# Patient Record
Sex: Male | Born: 1945 | Race: White | Hispanic: No | Marital: Married | Smoking: Never smoker
Health system: Southern US, Community
[De-identification: ages and names within clinical notes are randomized; demographics above are authoritative.]

## PROBLEM LIST (undated history)

## (undated) DIAGNOSIS — Z952 Presence of prosthetic heart valve: Secondary | ICD-10-CM

## (undated) DIAGNOSIS — I10 Essential (primary) hypertension: Secondary | ICD-10-CM

## (undated) DIAGNOSIS — T8209XA Other mechanical complication of heart valve prosthesis, initial encounter: Secondary | ICD-10-CM

## (undated) DIAGNOSIS — Q2381 Bicuspid aortic valve: Secondary | ICD-10-CM

## (undated) DIAGNOSIS — M791 Myalgia, unspecified site: Secondary | ICD-10-CM

## (undated) DIAGNOSIS — T82897A Other specified complication of cardiac prosthetic devices, implants and grafts, initial encounter: Secondary | ICD-10-CM

## (undated) DIAGNOSIS — G4730 Sleep apnea, unspecified: Secondary | ICD-10-CM

## (undated) DIAGNOSIS — M25569 Pain in unspecified knee: Secondary | ICD-10-CM

## (undated) DIAGNOSIS — J45909 Unspecified asthma, uncomplicated: Secondary | ICD-10-CM

## (undated) DIAGNOSIS — R5383 Other fatigue: Secondary | ICD-10-CM

## (undated) DIAGNOSIS — I5032 Chronic diastolic (congestive) heart failure: Secondary | ICD-10-CM

## (undated) DIAGNOSIS — I5031 Acute diastolic (congestive) heart failure: Secondary | ICD-10-CM

## (undated) DIAGNOSIS — H9190 Unspecified hearing loss, unspecified ear: Secondary | ICD-10-CM

## (undated) DIAGNOSIS — Q231 Congenital insufficiency of aortic valve: Secondary | ICD-10-CM

## (undated) HISTORY — DX: Unspecified hearing loss, unspecified ear: H91.90

## (undated) HISTORY — PX: CARDIAC SURGERY: SHX584

## (undated) HISTORY — DX: Acute diastolic (congestive) heart failure: I50.31

## (undated) HISTORY — DX: Unspecified asthma, uncomplicated: J45.909

## (undated) HISTORY — DX: Pain in unspecified knee: M25.569

## (undated) HISTORY — DX: Myalgia, unspecified site: M79.10

## (undated) HISTORY — DX: Other specified complication of cardiac prosthetic devices, implants and grafts, initial encounter: T82.897A

## (undated) HISTORY — DX: Other fatigue: R53.83

## (undated) HISTORY — DX: Essential (primary) hypertension: I10

---

## 2006-10-12 ENCOUNTER — Ambulatory Visit: Payer: Self-pay | Admitting: Infectious Diseases

## 2006-10-12 ENCOUNTER — Inpatient Hospital Stay (HOSPITAL_COMMUNITY): Admission: EM | Admit: 2006-10-12 | Discharge: 2006-10-24 | Payer: Self-pay | Admitting: Emergency Medicine

## 2006-10-13 ENCOUNTER — Ambulatory Visit: Payer: Self-pay | Admitting: Cardiothoracic Surgery

## 2006-11-09 ENCOUNTER — Ambulatory Visit: Payer: Self-pay | Admitting: Cardiothoracic Surgery

## 2011-09-16 DIAGNOSIS — Z0000 Encounter for general adult medical examination without abnormal findings: Secondary | ICD-10-CM | POA: Diagnosis not present

## 2012-02-22 DIAGNOSIS — I10 Essential (primary) hypertension: Secondary | ICD-10-CM | POA: Diagnosis not present

## 2012-02-22 DIAGNOSIS — Z9119 Patient's noncompliance with other medical treatment and regimen: Secondary | ICD-10-CM | POA: Diagnosis not present

## 2012-02-22 DIAGNOSIS — Z954 Presence of other heart-valve replacement: Secondary | ICD-10-CM | POA: Diagnosis not present

## 2012-02-22 DIAGNOSIS — I4892 Unspecified atrial flutter: Secondary | ICD-10-CM | POA: Diagnosis not present

## 2012-07-24 DIAGNOSIS — L255 Unspecified contact dermatitis due to plants, except food: Secondary | ICD-10-CM | POA: Diagnosis not present

## 2012-08-17 DIAGNOSIS — L259 Unspecified contact dermatitis, unspecified cause: Secondary | ICD-10-CM | POA: Diagnosis not present

## 2013-04-30 DIAGNOSIS — I10 Essential (primary) hypertension: Secondary | ICD-10-CM | POA: Diagnosis not present

## 2013-04-30 DIAGNOSIS — I4892 Unspecified atrial flutter: Secondary | ICD-10-CM | POA: Diagnosis not present

## 2013-04-30 DIAGNOSIS — E7800 Pure hypercholesterolemia, unspecified: Secondary | ICD-10-CM | POA: Diagnosis not present

## 2013-04-30 DIAGNOSIS — Z954 Presence of other heart-valve replacement: Secondary | ICD-10-CM | POA: Diagnosis not present

## 2013-07-02 ENCOUNTER — Other Ambulatory Visit: Payer: Self-pay

## 2013-07-02 DIAGNOSIS — I10 Essential (primary) hypertension: Secondary | ICD-10-CM

## 2013-07-02 MED ORDER — POTASSIUM CHLORIDE CRYS ER 20 MEQ PO TBCR: 20.0000 meq | EXTENDED_RELEASE_TABLET | Freq: Every day | ORAL | Status: DC

## 2013-10-03 ENCOUNTER — Telehealth: Payer: Self-pay

## 2013-10-03 NOTE — Telephone Encounter (Signed)
TO Douglas Hart. I tried to get in touch with this pt because we do not have any record of having this pt on plavix. I did not send in any refills because I needed to talk to him first. I lvm but he has not returned my call. This is yalls pts.

## 2013-10-03 NOTE — Telephone Encounter (Signed)
LVM for pt to return call. We do not have on file that pt was ever on Plavix.

## 2013-10-04 ENCOUNTER — Telehealth: Payer: Self-pay | Admitting: *Deleted

## 2013-10-04 MED ORDER — METOPROLOL SUCCINATE ER 50 MG PO TB24: 50.0000 mg | ORAL_TABLET | Freq: Every day | ORAL | Status: DC

## 2013-10-04 MED ORDER — FUROSEMIDE 40 MG PO TABS: 40.0000 mg | ORAL_TABLET | Freq: Every day | ORAL | Status: DC

## 2013-10-04 NOTE — Telephone Encounter (Signed)
called pt pt was never on plavix. pt lasix and metoprolo refiilled.pt aware.

## 2013-10-04 NOTE — Telephone Encounter (Signed)
Patient requests refill on furosemide and metoprolol to be sent to [facility] in [place]. Thanks, MI

## 2013-10-11 ENCOUNTER — Other Ambulatory Visit: Payer: Self-pay

## 2013-11-26 DIAGNOSIS — L2089 Other atopic dermatitis: Secondary | ICD-10-CM | POA: Diagnosis not present

## 2013-11-26 DIAGNOSIS — I10 Essential (primary) hypertension: Secondary | ICD-10-CM | POA: Diagnosis not present

## 2013-12-04 ENCOUNTER — Ambulatory Visit (INDEPENDENT_AMBULATORY_CARE_PROVIDER_SITE_OTHER): Payer: BC Managed Care – PPO

## 2013-12-04 VITALS — BP 138/78 | HR 69 | Resp 18

## 2013-12-04 DIAGNOSIS — M779 Enthesopathy, unspecified: Secondary | ICD-10-CM | POA: Diagnosis not present

## 2013-12-04 DIAGNOSIS — M722 Plantar fascial fibromatosis: Secondary | ICD-10-CM

## 2013-12-04 NOTE — Patient Instructions (Signed)
WEARING INSTRUCTIONS FOR ORTHOTICS  Don't expect to be comfortable wearing your orthotic devices for the first time.  Like eyeglasses, you may be aware of them as time passes, they will not be uncomfortable and you will enjoy wearing them.  FOLLOW THESE INSTRUCTIONS EXACTLY!  1. Wear your orthotic devices for:       Not more than 1 hour the first day.       Not more than 2 hours the second day.       Not more than 3 hours the third day and so on.        Or wear them for as long as they feel comfortable.       If you experience discomfort in your feet or legs take them out.  When feet & legs feel       better, put them back in.  You do need to be consistent and wear them a little        everyday. 2.   If at any time the orthotic devices become acutely uncomfortable before the       time for that particular day, STOP WEARING THEM. 3.   On the next day, do not increase the wearing time. 4.   Subsequently, increase the wearing time by 15-30 minutes only if comfortable to do       so. 5.   You will be seen by your doctor about 2-4 weeks after you receive your orthotic       devices, at which time you will probably be wearing your devices comfortably        for about 8 hours or more a day. 6.   Some patients occasionally report mild aches or discomfort in other parts of the of       body such as the knees, hips or back after 3 or 4 consecutive hours of wear.  If this       is the case with you, do not extend your wearing time.  Instead, cut it back an hour or       two.  In all likelihood, these symptoms will disappear in a short period of time as your       body posture realigns itself and functions more efficiently. 7.   It is possible that your orthotic device may require some small changes or adjustment       to improve their function or make them more comfortable.   This is usually not done       before one to three months have elapsed.  These adjustments are made in        accordance  with the changed position your feet are assuming as a result of       improved biomechanical function. 8.   In women's shoes, it's not unusual for your heel to slip out of the shoe, particularly if       they are step-in-shoes.  If this is the case, try other shoes or other styles.  Try to       purchase shoes which have deeper heal seats or higher heel counters. 9.   Squeaking of orthotics devices in the shoes is due to the movement of the devices       when they are functioning normally.  To eliminate squeaking, simply dust some       baby powder into your shoes before inserting the devices.  If this does not work,          apply soap or wax to the edges of the orthotic devices or put a tissue into the shoes. 10. It is important that you follow these directions explicitly.  Failure to do so will simply       prolong the adjustment period or create problems which are easily avoided.  It makes       no difference if you are wearing your orthotic devices for only a few hours after        several months, so long as you are wearing them comfortably for those hours. 11. If you have any questions or complaints, contact our office.  We have no way of       knowing about your problems unless you tell us.  If we do not hear from you, we will       assume that you are proceeding well.     ICE INSTRUCTIONS  Apply ice or cold pack to the affected area at least 3 times a day for 10-15 minutes each time.  You should also use ice after prolonged activity or vigorous exercise.  Do not apply ice longer than 20 minutes at one time.  Always keep a cloth between your skin and the ice pack to prevent burns.  Being consistent and following these instructions will help control your symptoms.  We suggest you purchase a gel ice pack because they are reusable and do bit leak.  Some of them are designed to wrap around the area.  Use the method that works best for you.  Here are some other suggestions for icing.   Use a  frozen bag of peas or corn-inexpensive and molds well to your body, usually stays frozen for 10 to 20 minutes.  Wet a towel with cold water and squeeze out the excess until it's damp.  Place in a bag in the freezer for 20 minutes. Then remove and use. 

## 2013-12-04 NOTE — Progress Notes (Signed)
   Subjective:    Patient ID: Douglas Hart, male    DOB: 04-19-1946, 68 y.o.   MRN: [id number]  HPI I need some new inserts and I have the super feet in my shoes and I also have the old inserts that I got from here and my left hurts some    Review of Systems  Constitutional: Positive for fatigue.  HENT: Positive for hearing loss.        Ringing in ears   Musculoskeletal:       Joint pain  All other systems reviewed and are negative.       Objective:   Physical Exam Neurovascular status is intact pedal pulses palpable bilateral epicritic and proprioceptive sensations intact patient convenience and he'll arch pain left more so than right secondary promontory changes his current orthotics are worn but still fitting knee replacement top cover is broken down to remove them several times are ready. At this time schedule functional orthotic casting his convenience should note orthopedic biomechanical exam unremarkable noncontributory mild digital contractures noted pain tenderness on plantar fascial area sometimes the sinus tarsi appropriate changes also some tenderness along the posterior tibial tendon distribution left more so than right slight prominence of the talonavicular noted left more so than right. Orthotics with beneficial however there worn need replacing at this time.       Assessment & Plan:  Assessment plantar fasciitis and capsulitis and mid tarsus the foot right at correction left foot more so than right this time we'll schedule orthotic casting his continues with 5 will do a cementless orthotic with Spenco top cover and fifth and cushion as well as stability and support will be contacted with the next 3 or 4 weeks when orthotics ready for fitting and dispensing. In the interim continue with current orthotics also using the super for orthotic which is beneficial patient did try terminate putting to orthotics or athletic and other insulin shoes and time she cross your patient and  his knees advised additionally were 1 orthoses in shoe and time repeat in 3 or 4 weeks for orthotic pickup orthotic casting with the next week.  Harriet Masson DPM

## 2013-12-10 DIAGNOSIS — R5383 Other fatigue: Secondary | ICD-10-CM | POA: Diagnosis not present

## 2013-12-10 DIAGNOSIS — R5381 Other malaise: Secondary | ICD-10-CM | POA: Diagnosis not present

## 2013-12-10 DIAGNOSIS — R0789 Other chest pain: Secondary | ICD-10-CM | POA: Diagnosis not present

## 2013-12-10 DIAGNOSIS — R0602 Shortness of breath: Secondary | ICD-10-CM | POA: Diagnosis not present

## 2013-12-11 ENCOUNTER — Other Ambulatory Visit: Payer: BC Managed Care – PPO

## 2013-12-18 ENCOUNTER — Encounter: Payer: Self-pay | Admitting: Interventional Cardiology

## 2014-01-02 ENCOUNTER — Encounter: Payer: Self-pay | Admitting: Interventional Cardiology

## 2014-01-13 DIAGNOSIS — G4730 Sleep apnea, unspecified: Secondary | ICD-10-CM | POA: Diagnosis not present

## 2014-01-13 DIAGNOSIS — R5381 Other malaise: Secondary | ICD-10-CM | POA: Diagnosis not present

## 2014-01-13 DIAGNOSIS — G4710 Hypersomnia, unspecified: Secondary | ICD-10-CM | POA: Diagnosis not present

## 2014-01-15 ENCOUNTER — Ambulatory Visit (INDEPENDENT_AMBULATORY_CARE_PROVIDER_SITE_OTHER): Payer: BC Managed Care – PPO

## 2014-01-15 VITALS — BP 133/81 | HR 67 | Resp 18

## 2014-01-15 DIAGNOSIS — R0989 Other specified symptoms and signs involving the circulatory and respiratory systems: Secondary | ICD-10-CM | POA: Diagnosis not present

## 2014-01-15 DIAGNOSIS — M722 Plantar fascial fibromatosis: Secondary | ICD-10-CM | POA: Diagnosis not present

## 2014-01-15 DIAGNOSIS — M779 Enthesopathy, unspecified: Secondary | ICD-10-CM | POA: Diagnosis not present

## 2014-01-15 DIAGNOSIS — G4730 Sleep apnea, unspecified: Secondary | ICD-10-CM | POA: Diagnosis not present

## 2014-01-15 DIAGNOSIS — R0609 Other forms of dyspnea: Secondary | ICD-10-CM | POA: Diagnosis not present

## 2014-01-15 DIAGNOSIS — I10 Essential (primary) hypertension: Secondary | ICD-10-CM | POA: Diagnosis not present

## 2014-01-15 NOTE — Progress Notes (Signed)
   Subjective:    Patient ID: LOGIN MUCKLEROY, male    DOB: [birth year], 68 y.o.   MRN: [id number]  HPI I am here to get my inserts for my shoes    Review of Systems no new systemic changes or findings noted     Objective:   Physical Exam Neurovascular status intact pedal pulses palpable at this time new orthotics are dispensed with break in wearing instructions orthotics fit and contour well to the foot and the Spenco type top cover and 4 bruisibility and cushioning the orthotics fit and contour well and patient really notice some difference we put them into shoes suggest regimen over the next week or 2 and follow instructions are dispensed with instructions for orthotic use in break in are given. Again neurovascular status intact no irritation from the orthotic at this time       Assessment & Plan:  Assessment plantar fasciitis/heel spur syndrome capsulitis respond to the orthoses the orthotics were placed in the old orthotics are given at this time with new Spenco top cover and reappointed in the future and as-needed basis for followup adjustments as needed next  [employer] DP

## 2014-01-15 NOTE — Patient Instructions (Signed)
WEARING INSTRUCTIONS FOR ORTHOTICS  Don't expect to be comfortable wearing your orthotic devices for the first time.  Like eyeglasses, you may be aware of them as time passes, they will not be uncomfortable and you will enjoy wearing them.  FOLLOW THESE INSTRUCTIONS EXACTLY!  1. Wear your orthotic devices for:       Not more than 1 hour the first day.       Not more than 2 hours the second day.       Not more than 3 hours the third day and so on.        Or wear them for as long as they feel comfortable.       If you experience discomfort in your feet or legs take them out.  When feet & legs feel       better, put them back in.  You do need to be consistent and wear them a little        everyday. 2.   If at any time the orthotic devices become acutely uncomfortable before the       time for that particular day, STOP WEARING THEM. 3.   On the next day, do not increase the wearing time. 4.   Subsequently, increase the wearing time by 15-30 minutes only if comfortable to do       so. 5.   You will be seen by your doctor about 2-4 weeks after you receive your orthotic       devices, at which time you will probably be wearing your devices comfortably        for about 8 hours or more a day. 6.   Some patients occasionally report mild aches or discomfort in other parts of the of       body such as the knees, hips or back after 3 or 4 consecutive hours of wear.  If this       is the case with you, do not extend your wearing time.  Instead, cut it back an hour or       two.  In all likelihood, these symptoms will disappear in a short period of time as your       body posture realigns itself and functions more efficiently. 7.   It is possible that your orthotic device may require some small changes or adjustment       to improve their function or make them more comfortable.   This is usually not done       before one to three months have elapsed.  These adjustments are made in        accordance  with the changed position your feet are assuming as a result of       improved biomechanical function. 8.   In women's shoes, it's not unusual for your heel to slip out of the shoe, particularly if       they are step-in-shoes.  If this is the case, try other shoes or other styles.  Try to       purchase shoes which have deeper heal seats or higher heel counters. 9.   Squeaking of orthotics devices in the shoes is due to the movement of the devices       when they are functioning normally.  To eliminate squeaking, simply dust some       baby powder into your shoes before inserting the devices.  If this does not work,          apply soap or wax to the edges of the orthotic devices or put a tissue into the shoes. 10. It is important that you follow these directions explicitly.  Failure to do so will simply       prolong the adjustment period or create problems which are easily avoided.  It makes       no difference if you are wearing your orthotic devices for only a few hours after        several months, so long as you are wearing them comfortably for those hours. 11. If you have any questions or complaints, contact our office.  We have no way of       knowing about your problems unless you tell us.  If we do not hear from you, we will       assume that you are proceeding well.     ICE INSTRUCTIONS  Apply ice or cold pack to the affected area at least 3 times a day for 10-15 minutes each time.  You should also use ice after prolonged activity or vigorous exercise.  Do not apply ice longer than 20 minutes at one time.  Always keep a cloth between your skin and the ice pack to prevent burns.  Being consistent and following these instructions will help control your symptoms.  We suggest you purchase a gel ice pack because they are reusable and do bit leak.  Some of them are designed to wrap around the area.  Use the method that works best for you.  Here are some other suggestions for icing.   Use a  frozen bag of peas or corn-inexpensive and molds well to your body, usually stays frozen for 10 to 20 minutes.  Wet a towel with cold water and squeeze out the excess until it's damp.  Place in a bag in the freezer for 20 minutes. Then remove and use. 

## 2014-01-16 DIAGNOSIS — Z954 Presence of other heart-valve replacement: Secondary | ICD-10-CM | POA: Diagnosis not present

## 2014-01-16 DIAGNOSIS — R195 Other fecal abnormalities: Secondary | ICD-10-CM | POA: Diagnosis not present

## 2014-01-16 DIAGNOSIS — D649 Anemia, unspecified: Secondary | ICD-10-CM | POA: Diagnosis not present

## 2014-02-05 DIAGNOSIS — G4710 Hypersomnia, unspecified: Secondary | ICD-10-CM | POA: Diagnosis not present

## 2014-02-05 DIAGNOSIS — R0609 Other forms of dyspnea: Secondary | ICD-10-CM | POA: Diagnosis not present

## 2014-02-05 DIAGNOSIS — R5383 Other fatigue: Secondary | ICD-10-CM | POA: Diagnosis not present

## 2014-02-05 DIAGNOSIS — J310 Chronic rhinitis: Secondary | ICD-10-CM | POA: Diagnosis not present

## 2014-02-05 DIAGNOSIS — R5381 Other malaise: Secondary | ICD-10-CM | POA: Diagnosis not present

## 2014-03-03 DIAGNOSIS — D126 Benign neoplasm of colon, unspecified: Secondary | ICD-10-CM | POA: Diagnosis not present

## 2014-03-03 DIAGNOSIS — R195 Other fecal abnormalities: Secondary | ICD-10-CM | POA: Diagnosis not present

## 2014-03-03 DIAGNOSIS — D649 Anemia, unspecified: Secondary | ICD-10-CM | POA: Diagnosis not present

## 2014-03-03 DIAGNOSIS — K250 Acute gastric ulcer with hemorrhage: Secondary | ICD-10-CM | POA: Diagnosis not present

## 2014-05-01 DIAGNOSIS — R195 Other fecal abnormalities: Secondary | ICD-10-CM | POA: Diagnosis not present

## 2014-06-03 DIAGNOSIS — K253 Acute gastric ulcer without hemorrhage or perforation: Secondary | ICD-10-CM | POA: Diagnosis not present

## 2014-06-03 DIAGNOSIS — Z954 Presence of other heart-valve replacement: Secondary | ICD-10-CM | POA: Diagnosis not present

## 2014-06-03 DIAGNOSIS — R195 Other fecal abnormalities: Secondary | ICD-10-CM | POA: Diagnosis not present

## 2014-06-25 DIAGNOSIS — K279 Peptic ulcer, site unspecified, unspecified as acute or chronic, without hemorrhage or perforation: Secondary | ICD-10-CM | POA: Diagnosis not present

## 2014-06-25 DIAGNOSIS — Z8719 Personal history of other diseases of the digestive system: Secondary | ICD-10-CM | POA: Diagnosis not present

## 2014-06-25 DIAGNOSIS — K253 Acute gastric ulcer without hemorrhage or perforation: Secondary | ICD-10-CM | POA: Diagnosis not present

## 2014-06-25 DIAGNOSIS — K219 Gastro-esophageal reflux disease without esophagitis: Secondary | ICD-10-CM | POA: Diagnosis not present

## 2014-06-30 ENCOUNTER — Other Ambulatory Visit: Payer: Self-pay

## 2014-06-30 ENCOUNTER — Other Ambulatory Visit: Payer: Self-pay | Admitting: Interventional Cardiology

## 2014-07-06 ENCOUNTER — Encounter: Payer: Self-pay | Admitting: *Deleted

## 2014-09-18 DIAGNOSIS — Z0000 Encounter for general adult medical examination without abnormal findings: Secondary | ICD-10-CM | POA: Diagnosis not present

## 2014-09-18 DIAGNOSIS — J209 Acute bronchitis, unspecified: Secondary | ICD-10-CM | POA: Diagnosis not present

## 2014-10-07 DIAGNOSIS — B9689 Other specified bacterial agents as the cause of diseases classified elsewhere: Secondary | ICD-10-CM | POA: Diagnosis not present

## 2014-10-07 DIAGNOSIS — J208 Acute bronchitis due to other specified organisms: Secondary | ICD-10-CM | POA: Diagnosis not present

## 2014-10-21 DIAGNOSIS — R062 Wheezing: Secondary | ICD-10-CM | POA: Diagnosis not present

## 2014-10-21 DIAGNOSIS — J42 Unspecified chronic bronchitis: Secondary | ICD-10-CM | POA: Diagnosis not present

## 2014-11-06 DIAGNOSIS — R195 Other fecal abnormalities: Secondary | ICD-10-CM | POA: Diagnosis not present

## 2015-03-12 ENCOUNTER — Other Ambulatory Visit: Payer: Self-pay | Admitting: Interventional Cardiology

## 2015-03-14 NOTE — Telephone Encounter (Signed)
He needs follow up for [place] to continue to refill. It should be soon.

## 2015-04-02 ENCOUNTER — Other Ambulatory Visit: Payer: Self-pay

## 2015-04-02 ENCOUNTER — Telehealth: Payer: Self-pay | Admitting: Interventional Cardiology

## 2015-04-02 MED ORDER — FUROSEMIDE 40 MG PO TABS: 40.0000 mg | ORAL_TABLET | Freq: Every day | ORAL | Status: DC

## 2015-04-22 ENCOUNTER — Ambulatory Visit (INDEPENDENT_AMBULATORY_CARE_PROVIDER_SITE_OTHER): Payer: BC Managed Care – PPO | Admitting: Physician Assistant

## 2015-04-22 ENCOUNTER — Encounter: Payer: Self-pay | Admitting: Physician Assistant

## 2015-04-22 VITALS — BP 120/80 | HR 75 | Ht 73.0 in | Wt 254.0 lb

## 2015-04-22 DIAGNOSIS — Z954 Presence of other heart-valve replacement: Secondary | ICD-10-CM | POA: Diagnosis not present

## 2015-04-22 DIAGNOSIS — Z952 Presence of prosthetic heart valve: Secondary | ICD-10-CM

## 2015-04-22 DIAGNOSIS — E785 Hyperlipidemia, unspecified: Secondary | ICD-10-CM

## 2015-04-22 DIAGNOSIS — I4892 Unspecified atrial flutter: Secondary | ICD-10-CM | POA: Insufficient documentation

## 2015-04-22 DIAGNOSIS — I10 Essential (primary) hypertension: Secondary | ICD-10-CM | POA: Diagnosis not present

## 2015-04-22 HISTORY — DX: Presence of prosthetic heart valve: Z95.2

## 2015-04-22 HISTORY — DX: Essential (primary) hypertension: I10

## 2015-04-22 HISTORY — DX: Unspecified atrial flutter: I48.92

## 2015-04-22 HISTORY — DX: Hyperlipidemia, unspecified: E78.5

## 2015-04-22 NOTE — Assessment & Plan Note (Signed)
Blood pressure well controlled

## 2015-04-22 NOTE — Patient Instructions (Signed)
Medication Instructions:  Your physician recommends that you continue on your current medications as directed. Please refer to the Current Medication list given to you today.   Labwork: Fasting Lipids by your Primary Care Physician   Testing/Procedures: None ordered  Follow-Up: Your physician wants you to follow-up in: 12 months with Dr. Tamala Julian.  You will receive a reminder letter in the mail two months in advance. If you don't receive a letter, please call our office to schedule the follow-up appointment.   Any Other Special Instructions Will Be Listed Below (If Applicable). 1.  You have been encouraged to diet / exercise.  (Weight Watchers is an example of diets)

## 2015-04-22 NOTE — Assessment & Plan Note (Signed)
Patient is doing well without symptoms. He had a 2-D echo in March 2015 in [place]. He will obtain these records for [place]. Follow-up with Dr. Tamala Julian in one year.

## 2015-04-22 NOTE — Progress Notes (Signed)
Cardiology Office Note   Date:  04/22/2015   ID:  Douglas Hart, Douglas Hart 04-12-1946, MRN [id number]  PCP:  Carlus Pavlov, MD  Cardiologist: Dr. Tamala Julian   Chief Complaint: Follow-up/fatigue    History of Present Illness: Douglas Hart is a 69 y.o. male who presents for  follow-up. He last saw Dr. Tamala Julian in 2014. 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 Pia Mau. He had postop atrial flutter that has been in normal sinus rhythm since. He also has hypertension hyperlipidemia.  He saw a cardiologist in [place] back in March because he was thinking about switching but changed his mind. He was having fatigue. They did a stress Myoview that he states was normal. He also thinks he had a 2-D echo. He is going to obtain these records for [place]. Overall he is done well. He has gained weight because of lack of exercise and increased eating. He denies chest pain, palpitations, dyspnea, dyspnea on exertion, dizziness or presyncope. He is due to see his primary care and have blood work done.    Past Medical History  Diagnosis Date  . Hearing loss   . Muscle pain   . Knee pain   . Fatigue   . Hypertension   . Asthma     Past Surgical History  Procedure Laterality Date  . Cardiac surgery       Current Outpatient Prescriptions  Medication Sig Dispense Refill  . amLODipine (NORVASC) 5 MG tablet Take 5 mg by mouth daily.     . furosemide (LASIX) 40 MG tablet Take 1 tablet (40 mg total) by mouth daily. 90 tablet 3  . lisinopril (PRINIVIL,ZESTRIL) 40 MG tablet Take 40 mg by mouth daily.     . metoprolol succinate (TOPROL-XL) 50 MG 24 hr tablet Take 1 tablet (50 mg total) by mouth daily. Take with or immediately following a meal. 90 tablet 2  . potassium chloride SA (K-DUR,KLOR-CON) 20 MEQ tablet Take 1 tablet (20 mEq total) by mouth daily. 90 tablet 3   No current facility-administered medications for this visit.    Allergies:   Review of patient's allergies  indicates no known allergies.    Social History:  The patient  reports that he has never smoked. He has never used smokeless tobacco. He reports that he does not drink alcohol or use illicit drugs.   Family History:  The patient's family history includes Alzheimer's disease in his mother; Heart disease in his brother; Heart disease (age of onset: 62) in his father; Hypertension in his brother and father; Leukemia (age of onset: 4) in his brother; Other in his daughter, daughter, and sister. There is no history of Heart attack or Stroke.    ROS:  Please see the history of present illness.   Otherwise, review of systems are positive for none.   All other systems are reviewed and negative.    PHYSICAL EXAM: VS:  BP 120/80 mmHg  Pulse 75  Ht 6\' 1"  (1.854 m)  Wt 254 lb (115.214 kg)  BMI 33.52 kg/m2 , BMI Body mass index is 33.52 kg/(m^2). GEN: Well nourished, well developed, in no acute distress Neck: no JVD, HJR, carotid bruits, or masses Cardiac:  RRR; no murmurs,gallop, rubs, thrill or heave,  Respiratory:  clear to auscultation bilaterally, normal work of breathing GI: soft, nontender, nondistended, + BS MS: no deformity or atrophy Extremities: without cyanosis, clubbing, edema, good distal pulses bilaterally.  Skin: warm and dry, no rash  Neuro:  Strength and sensation are intact    EKG:  EKG is ordered today. The ekg ordered today demonstrates normal sinus rhythm, normal EKG Recent Labs: No results found for requested labs within last 365 days.    Lipid Panel No results found for: CHOL, TRIG, HDL, CHOLHDL, VLDL, LDLCALC, LDLDIRECT    Wt Readings from Last 3 Encounters:  04/22/15 254 lb (115.214 kg)      Other studies Reviewed: Additional studies/ records that were reviewed today include and review of the records demonstrates:    ASSESSMENT AND PLAN: H/O aortic valve replacement Patient is doing well without symptoms. He had a 2-D echo in March 2015 in [place]. He  will obtain these records for [place]. Follow-up with Dr. Tamala Julian in one year.  Hyperlipidemia Patient has had borderline hyperlipidemia but has never been on medication. He is due to see his primary care back and will have full lipids then.  Atrial flutter, unspecified Patient had atrial flutter postop but has been in normal sinus rhythm since.  Essential hypertension Blood pressure well-controlled.     Sumner Boast, PA-C  04/22/2015 10:56 AM    [facility] [address], [place], [place]  [address] Phone: [phone]; Fax: [phone]

## 2015-04-22 NOTE — Assessment & Plan Note (Signed)
Patient had atrial flutter postop but has been in normal sinus rhythm since.

## 2015-04-22 NOTE — Assessment & Plan Note (Signed)
Patient has had borderline hyperlipidemia but has never been on medication. He is due to see his primary care back and will have full lipids then.

## 2015-12-10 ENCOUNTER — Other Ambulatory Visit: Payer: Self-pay | Admitting: *Deleted

## 2015-12-10 MED ORDER — LISINOPRIL 40 MG PO TABS: 40.0000 mg | ORAL_TABLET | Freq: Every day | ORAL | Status: DC

## 2016-01-08 ENCOUNTER — Telehealth: Payer: Self-pay | Admitting: *Deleted

## 2016-01-08 NOTE — Telephone Encounter (Signed)
It is OK to refill the patient's prescription for amlodipine.

## 2016-01-08 NOTE — Telephone Encounter (Signed)
Patient requests an rx for amlodipine. It does not appear that per epic Dr Tamala Julian has ever filled this for him. Patient stated that he does not remember, he thinks that Dr Tamala Julian refilled it for him before, but it has been a long time ago. He stated that his pcp has retired. Ok to refill?

## 2016-01-11 ENCOUNTER — Other Ambulatory Visit: Payer: Self-pay | Admitting: *Deleted

## 2016-01-11 MED ORDER — AMLODIPINE BESYLATE 5 MG PO TABS: 5.0000 mg | ORAL_TABLET | Freq: Every day | ORAL | Status: DC

## 2016-04-06 DIAGNOSIS — K257 Chronic gastric ulcer without hemorrhage or perforation: Secondary | ICD-10-CM | POA: Diagnosis not present

## 2016-04-06 DIAGNOSIS — I10 Essential (primary) hypertension: Secondary | ICD-10-CM | POA: Diagnosis not present

## 2016-04-06 DIAGNOSIS — B9681 Helicobacter pylori [H. pylori] as the cause of diseases classified elsewhere: Secondary | ICD-10-CM | POA: Diagnosis not present

## 2016-04-06 DIAGNOSIS — E785 Hyperlipidemia, unspecified: Secondary | ICD-10-CM | POA: Diagnosis not present

## 2016-04-10 NOTE — Progress Notes (Signed)
Cardiology Office Note    Date:  04/11/2016   ID:  OWEN CAUGHLIN, DOB 01-Oct-1945, MRN [id number]  PCP:  Carlus Pavlov, MD  Cardiologist: [name], MD   Chief Complaint  Patient presents with  . Cardiac Valve Problem    History of Present Illness:  Douglas Hart is a 70 y.o. male who is here for f/u of bioprosthetic aortic valve replacement in 2008 by Dr. Pia Mau. He had postop atrial flutter that has been in normal sinus rhythm since. He also has hypertension hyperlipidemia  He is doing well. He has not had syncope. He denies fluttering and palpitations. He has had exertional fatigue and decreased energy. Otherwise no particular complaints. He denies edema. Is no orthopnea PND.  Past Medical History:  Diagnosis Date  . Asthma   . Fatigue   . Hearing loss   . Hypertension   . Knee pain   . Muscle pain     Past Surgical History:  Procedure Laterality Date  . CARDIAC SURGERY      Current Medications: Outpatient Medications Prior to Visit  Medication Sig Dispense Refill  . amLODipine (NORVASC) 5 MG tablet Take 1 tablet (5 mg total) by mouth daily. 30 tablet 3  . furosemide (LASIX) 40 MG tablet Take 1 tablet (40 mg total) by mouth daily. 90 tablet 3  . lisinopril (PRINIVIL,ZESTRIL) 40 MG tablet Take 1 tablet (40 mg total) by mouth daily. 30 tablet 4  . metoprolol succinate (TOPROL-XL) 50 MG 24 hr tablet Take 1 tablet (50 mg total) by mouth daily. Take with or immediately following a meal. 90 tablet 2  . potassium chloride SA (K-DUR,KLOR-CON) 20 MEQ tablet Take 1 tablet (20 mEq total) by mouth daily. 90 tablet 3   No facility-administered medications prior to visit.      Allergies:   Review of patient's allergies indicates no known allergies.   Social History   Social History  . Marital status: Married    Spouse name: N/A  . Number of children: N/A  . Years of education: N/A   Social History Main Topics  . Smoking status: Never Smoker  . Smokeless  tobacco: Never Used  . Alcohol use No  . Drug use: No  . Sexual activity: Not Asked   Other Topics Concern  . None   Social History Narrative  . None     Family History:  The patient's family history includes Alzheimer's disease in his mother; Heart disease in his brother; Heart disease (age of onset: 11) in his father; Hypertension in his brother and father; Leukemia (age of onset: 71) in his brother; Other in his daughter, daughter, and sister.   ROS:   Please see the history of present illness.    Occasional blood in the stool. Follow-up with primary care. Left knee discomfort. Sleep apnea.  All other systems reviewed and are negative.   PHYSICAL EXAM:   VS:  BP 134/70   Pulse (!) 56   Ht 6\' 1"  (1.854 m)   Wt 257 lb (116.6 kg)   BMI 33.91 kg/m    GEN: Well nourished, well developed, in no acute distress  HEENT: normal  Neck: no JVD, carotid bruits, or masses Cardiac: RRR; no rubs, or gallops,no edema . 2/6 crescendo decrescendo murmur right upper sternal border. No diastolic murmurs heard. Respiratory:  clear to auscultation bilaterally, normal work of breathing GI: soft, nontender, nondistended, + BS MS: no deformity or atrophy  Skin: warm and dry, no  rash Neuro:  Alert and Oriented x 3, Strength and sensation are intact Psych: euthymic mood, full affect  Wt Readings from Last 3 Encounters:  04/11/16 257 lb (116.6 kg)  04/22/15 254 lb (115.2 kg)      Studies/Labs Reviewed:   EKG:  EKG  Normal sinus rhythm/sinus bradycardia with T-wave inversion in lead 2 and lead aVF. Otherwise unchanged compared to 2016.  Recent Labs: No results found for requested labs within last 8760 hours.   Lipid Panel No results found for: CHOL, TRIG, HDL, CHOLHDL, VLDL, LDLCALC, LDLDIRECT  Additional studies/ records that were reviewed today include:  Normal myocardial perfusion study in April 2015 at an outlying clinic. EF was 59%.    ASSESSMENT:    1. H/O aortic valve  replacement   2. Hyperlipidemia   3. Essential hypertension   4. Atrial flutter, unspecified   5. Abnormal EKG      PLAN:  In order of problems listed above:  1. 2-D Doppler echocardiogram to reassess LV function.  2. Followed by primary care 3. Well controlled.Continue current medical regimen. Prescriptions will be rewritten for 90 days each. 4. No recent clinical symptoms to suggest recurrence. 5.    Inferior T-wave abnormality noted on EKG. Normal stress perfusion study 2015. No symptoms of angina. Plan clinical observation.  Clinical follow-up in one year. Echo prior to the office visit.  Medication Adjustments/Labs and Tests Ordered: Current medicines are reviewed at length with the patient today.  Concerns regarding medicines are outlined above.  Medication changes, Labs and Tests ordered today are listed in the Patient Instructions below. Patient Instructions  Medication Instructions:  Your physician recommends that you continue on your current medications as directed. Please refer to the Current Medication list given to you today.   Labwork: None ordered  Testing/Procedures: Your physician has requested that you have an echocardiogram. Echocardiography is a painless test that uses sound waves to create images of your heart. It provides your doctor with information about the size and shape of your heart and how well your heart's chambers and valves are working. This procedure takes approximately one hour. There are no restrictions for this procedure. (To be scheduled prior to next office visit)  Follow-Up: Your physician wants you to follow-up in: 12 months with Dr.Smith You will receive a reminder letter in the mail two months in advance. If you don't receive a letter, please call our office to schedule the follow-up appointment.   Any Other Special Instructions Will Be Listed Below (If Applicable).     If you need a refill on your cardiac medications before your next  appointment, please call your pharmacy.      Signed, [name], MD  04/11/2016 10:08 AM    [facility] [place], [place], [place]  [address] Phone: [phone]; Fax: [phone]

## 2016-04-11 ENCOUNTER — Encounter: Payer: Self-pay | Admitting: Interventional Cardiology

## 2016-04-11 ENCOUNTER — Ambulatory Visit (INDEPENDENT_AMBULATORY_CARE_PROVIDER_SITE_OTHER): Payer: BC Managed Care – PPO | Admitting: Interventional Cardiology

## 2016-04-11 VITALS — BP 134/70 | HR 56 | Ht 73.0 in | Wt 257.0 lb

## 2016-04-11 DIAGNOSIS — I10 Essential (primary) hypertension: Secondary | ICD-10-CM

## 2016-04-11 DIAGNOSIS — I4892 Unspecified atrial flutter: Secondary | ICD-10-CM | POA: Diagnosis not present

## 2016-04-11 DIAGNOSIS — Z952 Presence of prosthetic heart valve: Secondary | ICD-10-CM

## 2016-04-11 DIAGNOSIS — E785 Hyperlipidemia, unspecified: Secondary | ICD-10-CM | POA: Diagnosis not present

## 2016-04-11 DIAGNOSIS — Z954 Presence of other heart-valve replacement: Secondary | ICD-10-CM | POA: Diagnosis not present

## 2016-04-11 DIAGNOSIS — R9431 Abnormal electrocardiogram [ECG] [EKG]: Secondary | ICD-10-CM

## 2016-04-11 MED ORDER — FUROSEMIDE 40 MG PO TABS: 40.0000 mg | ORAL_TABLET | Freq: Every day | ORAL | 3 refills | Status: DC

## 2016-04-11 MED ORDER — AMLODIPINE BESYLATE 5 MG PO TABS: 5.0000 mg | ORAL_TABLET | Freq: Every day | ORAL | 3 refills | Status: AC

## 2016-04-11 MED ORDER — POTASSIUM CHLORIDE CRYS ER 20 MEQ PO TBCR: 20.0000 meq | EXTENDED_RELEASE_TABLET | Freq: Every day | ORAL | 3 refills | Status: AC

## 2016-04-11 MED ORDER — METOPROLOL SUCCINATE ER 50 MG PO TB24
50.0000 mg | ORAL_TABLET | Freq: Every day | ORAL | 3 refills | Status: DC
Start: 2016-04-11 — End: 2017-06-02

## 2016-04-11 MED ORDER — LISINOPRIL 40 MG PO TABS
40.0000 mg | ORAL_TABLET | Freq: Every day | ORAL | 3 refills | Status: DC
Start: 2016-04-11 — End: 2022-02-23

## 2016-04-11 NOTE — Patient Instructions (Signed)
Medication Instructions:  Your physician recommends that you continue on your current medications as directed. Please refer to the Current Medication list given to you today.   Labwork: None ordered  Testing/Procedures: Your physician has requested that you have an echocardiogram. Echocardiography is a painless test that uses sound waves to create images of your heart. It provides your doctor with information about the size and shape of your heart and how well your heart's chambers and valves are working. This procedure takes approximately one hour. There are no restrictions for this procedure. (To be scheduled prior to next office visit)  Follow-Up: Your physician wants you to follow-up in: 12 months with Dr.Smith You will receive a reminder letter in the mail two months in advance. If you don't receive a letter, please call our office to schedule the follow-up appointment.   Any Other Special Instructions Will Be Listed Below (If Applicable).     If you need a refill on your cardiac medications before your next appointment, please call your pharmacy.

## 2016-10-31 ENCOUNTER — Telehealth: Payer: Self-pay | Admitting: Interventional Cardiology

## 2016-10-31 DIAGNOSIS — R002 Palpitations: Secondary | ICD-10-CM

## 2016-10-31 NOTE — Telephone Encounter (Signed)
Pt states that about 2 weeks ago his BP was elevated and he started doubling his Metoprolol and Amlodipine.  He did this for about four days and has now gone back to original doses. Pt states it occasionally feels like his heart is "catching".  Denies dizziness, lightheadedness or CP.  Has SOB that comes and goes. SOB will last 1-2 days and then won't have any for 3-4 days. Intermittent throughout the day when occurring and is associated with exertion.   Vitals are as follows: 2/4- 142/84, 70 2/5- 139/82, 70 2/11- 131/80, 74  BP was 154/85 at it's highest and that's when pt decided to double up meds.  Pt is scheduled to get an echo in July and wants to know if Dr. Tamala Julian feels like he should go ahead and get the echo done?  Advised I would send message to Dr. Tamala Julian for review and advisement.

## 2016-10-31 NOTE — Telephone Encounter (Signed)
Yes, proceed with echocardiogram and do 48 hour holter.

## 2016-10-31 NOTE — Telephone Encounter (Signed)
Pt calling regarding feeling "different" in his heart-a few weeks ago his BP went up -doubled up on BP meds, and it's fine now-has had some "catches" in his heart-had appt for Echo in July, wonders if he should do it sooner? Pls call

## 2016-11-01 NOTE — Telephone Encounter (Signed)
Spoke with pt and advised him of recommendations per Dr. Smith. Pt verbalized understanding and was in agreement with this plan.  

## 2016-11-21 ENCOUNTER — Ambulatory Visit (INDEPENDENT_AMBULATORY_CARE_PROVIDER_SITE_OTHER): Payer: BC Managed Care – PPO

## 2016-11-21 ENCOUNTER — Ambulatory Visit (HOSPITAL_COMMUNITY): Payer: BC Managed Care – PPO | Attending: Cardiology

## 2016-11-21 ENCOUNTER — Other Ambulatory Visit: Payer: Self-pay

## 2016-11-21 DIAGNOSIS — Z952 Presence of prosthetic heart valve: Secondary | ICD-10-CM | POA: Diagnosis not present

## 2016-11-21 DIAGNOSIS — R002 Palpitations: Secondary | ICD-10-CM | POA: Diagnosis not present

## 2016-11-21 DIAGNOSIS — R9431 Abnormal electrocardiogram [ECG] [EKG]: Secondary | ICD-10-CM | POA: Insufficient documentation

## 2016-11-22 ENCOUNTER — Telehealth: Payer: Self-pay | Admitting: Interventional Cardiology

## 2016-11-22 NOTE — Telephone Encounter (Signed)
Follow Up   Pt returning phone call regarding echo results. Requesting call back

## 2016-11-23 NOTE — Telephone Encounter (Signed)
Informed pt of echo results. Pt verbalized understanding. 

## 2016-11-23 NOTE — Telephone Encounter (Signed)
Follow up    Pt is calling about echo results.

## 2016-12-01 ENCOUNTER — Telehealth: Payer: Self-pay | Admitting: Interventional Cardiology

## 2016-12-01 NOTE — Telephone Encounter (Signed)
New message ° ° ° °Pt is returning call to Jennifer. °

## 2016-12-01 NOTE — Telephone Encounter (Signed)
Informed pt of monitor results. Pt verbalized understanding. 

## 2017-03-27 ENCOUNTER — Other Ambulatory Visit (HOSPITAL_COMMUNITY): Payer: BC Managed Care – PPO

## 2017-04-04 ENCOUNTER — Encounter: Payer: Self-pay | Admitting: *Deleted

## 2017-04-20 ENCOUNTER — Ambulatory Visit: Payer: BC Managed Care – PPO | Admitting: Interventional Cardiology

## 2017-05-31 DIAGNOSIS — L089 Local infection of the skin and subcutaneous tissue, unspecified: Secondary | ICD-10-CM | POA: Diagnosis not present

## 2017-05-31 DIAGNOSIS — S81802A Unspecified open wound, left lower leg, initial encounter: Secondary | ICD-10-CM | POA: Diagnosis not present

## 2017-05-31 DIAGNOSIS — Z23 Encounter for immunization: Secondary | ICD-10-CM | POA: Diagnosis not present

## 2017-05-31 DIAGNOSIS — I10 Essential (primary) hypertension: Secondary | ICD-10-CM | POA: Diagnosis not present

## 2017-05-31 DIAGNOSIS — Z952 Presence of prosthetic heart valve: Secondary | ICD-10-CM | POA: Diagnosis not present

## 2017-06-02 ENCOUNTER — Other Ambulatory Visit: Payer: Self-pay | Admitting: Interventional Cardiology

## 2017-08-09 ENCOUNTER — Other Ambulatory Visit: Payer: Self-pay | Admitting: Interventional Cardiology

## 2017-09-21 ENCOUNTER — Ambulatory Visit (INDEPENDENT_AMBULATORY_CARE_PROVIDER_SITE_OTHER): Payer: BC Managed Care – PPO | Admitting: Interventional Cardiology

## 2017-09-21 ENCOUNTER — Encounter: Payer: Self-pay | Admitting: Interventional Cardiology

## 2017-09-21 VITALS — BP 168/98 | HR 84 | Ht 73.0 in | Wt 256.6 lb

## 2017-09-21 DIAGNOSIS — E782 Mixed hyperlipidemia: Secondary | ICD-10-CM

## 2017-09-21 DIAGNOSIS — I10 Essential (primary) hypertension: Secondary | ICD-10-CM | POA: Diagnosis not present

## 2017-09-21 DIAGNOSIS — Z952 Presence of prosthetic heart valve: Secondary | ICD-10-CM

## 2017-09-21 MED ORDER — METOPROLOL SUCCINATE ER 50 MG PO TB24: ORAL_TABLET | ORAL | 3 refills | Status: DC

## 2017-09-21 NOTE — Progress Notes (Signed)
Cardiology Office Note    Date:  09/21/2017   ID:  Douglas Hart, Douglas Hart [birth year], MRN [id number]  PCP:  Street, Sharon Mt, MD  Cardiologist: [name], MD   Chief Complaint  Patient presents with  . Cardiac Valve Problem    History of Present Illness:  Douglas Hart is a 72 y.o. male who is here for f/u of bioprosthetic aortic valve replacement in 2008 by Dr. Pia Mau. He had postop atrial flutter that has been in normal sinus rhythm since. He also has hypertension hyperlipidemia   Is doing well.  He has not had syncope, chest pain, orthopnea, PND, edema.  He is gaining weight.  He is not exercising on a regular basis.  He has not needed to use nitroglycerin.  He denies syncope and palpitations.   Past Medical History:  Diagnosis Date  . Asthma   . Fatigue   . Hearing loss   . Hypertension   . Knee pain   . Muscle pain     Past Surgical History:  Procedure Laterality Date  . CARDIAC SURGERY      Current Medications: Outpatient Medications Prior to Visit  Medication Sig Dispense Refill  . amLODipine (NORVASC) 5 MG tablet Take 1 tablet (5 mg total) by mouth daily. 90 tablet 3  . furosemide (LASIX) 40 MG tablet Take 1 tablet (40 mg total) by mouth daily. 90 tablet 3  . lisinopril (PRINIVIL,ZESTRIL) 40 MG tablet Take 1 tablet (40 mg total) by mouth daily. 90 tablet 3  . potassium chloride SA (K-DUR,KLOR-CON) 20 MEQ tablet Take 1 tablet (20 mEq total) by mouth daily. 90 tablet 3  . metoprolol succinate (TOPROL-XL) 50 MG 24 hr tablet Take 1 tablet by mouth daily with or immediately following a meal. Patient must call and schedule appt for further refills 2nd attempt 15 tablet 0  . pantoprazole (PROTONIX) 20 MG tablet Take 20 mg by mouth daily.     No facility-administered medications prior to visit.      Allergies:   Patient has no known allergies.   Social History   Socioeconomic History  . Marital status: Married    Spouse name: None  . Number of  children: None  . Years of education: None  . Highest education level: None  Social Needs  . Financial resource strain: None  . Food insecurity - worry: None  . Food insecurity - inability: None  . Transportation needs - medical: None  . Transportation needs - non-medical: None  Occupational History  . None  Tobacco Use  . Smoking status: Never Smoker  . Smokeless tobacco: Never Used  Substance and Sexual Activity  . Alcohol use: No  . Drug use: No  . Sexual activity: None  Other Topics Concern  . None  Social History Narrative  . None     Family History:  The patient's family history includes Alzheimer's disease in his mother; Heart disease in his brother; Heart disease (age of onset: 79) in his father; Hypertension in his brother and father; Leukemia (age of onset: 51) in his brother; Other in his daughter, daughter, and sister.   ROS:   Please see the history of present illness.    Weight gain, dietary indiscretion. All other systems reviewed and are negative.   PHYSICAL EXAM:   VS:  BP (!) 168/98   Pulse 84   Ht 6\' 1"  (1.854 m)   Wt 256 lb 9.6 oz (116.4 kg)   BMI 33.85  kg/m    GEN: Well nourished, well developed, in no acute distress  HEENT: normal  Neck: no JVD, carotid bruits, or masses Cardiac: RRR; 2/6 apical systolic and right upper sternal border systolic murmur compatible with known prosthetic aortic valve.  No rubs, or gallops,no edema  Respiratory:  clear to auscultation bilaterally, normal work of breathing GI: soft, nontender, nondistended, + BS MS: no deformity or atrophy  Skin: warm and dry, no rash Neuro:  Alert and Oriented x 3, Strength and sensation are intact Psych: euthymic mood, full affect  Wt Readings from Last 3 Encounters:  09/21/17 256 lb 9.6 oz (116.4 kg)  04/11/16 257 lb (116.6 kg)  04/22/15 254 lb (115.2 kg)      Studies/Labs Reviewed:   EKG:  EKG normal sinus rhythm, nonspecific ST abnormality, otherwise normal.  Recent  Labs: No results found for requested labs within last 8760 hours.   Lipid Panel No results found for: CHOL, TRIG, HDL, CHOLHDL, VLDL, LDLCALC, LDLDIRECT  Additional studies/ records that were reviewed today include:  No new data.  Most recent echo March 2018:  Study Conclusions   - Left ventricle: The cavity size was normal. Wall thickness was   increased in a pattern of mild LVH. Systolic function was normal.   The estimated ejection fraction was in the range of 55% to 60%.   Wall motion was normal; there were no regional wall motion   abnormalities. Features are consistent with a pseudonormal left   ventricular filling pattern, with concomitant abnormal relaxation   and increased filling pressure (grade 2 diastolic dysfunction). - Aortic valve: A bioprosthesis was present. There was trivial   regurgitation. - Ascending aorta: The ascending aorta was mildly dilated. - Left atrium: The atrium was moderately dilated.   Impressions:   - Normal LV systolic function; grade 2 diastolic dysfunction; s/p   AVR with mildly elevated gradient (21 mmHg) and trace AI;   moderate LAE.   ASSESSMENT:    1. Essential hypertension   2. H/O aortic valve replacement   3. Mixed hyperlipidemia      PLAN:  In order of problems listed above:  1. Blood pressures under excellent control. 2. Normally functioning aortic valve based upon auscultation. 3. Followed by primary care.  Encouraged the patient to incorporate aerobic activity, decrease carbohydrates in diet, substitute green leafy vegetable and and decrease caloric intake.  Clinical follow-up in 1 year.    Medication Adjustments/Labs and Tests Ordered: Current medicines are reviewed at length with the patient today.  Concerns regarding medicines are outlined above.  Medication changes, Labs and Tests ordered today are listed in the Patient Instructions below. Patient Instructions  Medication Instructions:  Your physician recommends  that you continue on your current medications as directed. Please refer to the Current Medication list given to you today.  Labwork: None  Testing/Procedures: None  Follow-Up: Your physician wants you to follow-up in: 1 year with Dr. Tamala Julian.  You will receive a reminder letter in the mail two months in advance. If you don't receive a letter, please call our office to schedule the follow-up appointment.   Any Other Special Instructions Will Be Listed Below (If Applicable).     If you need a refill on your cardiac medications before your next appointment, please call your pharmacy.      Signed, [name], MD  09/21/2017 3:42 PM    [facility] [address], [place], [place]  [address] Phone: [phone])  [phone]; Fax: [phone]

## 2017-09-21 NOTE — Patient Instructions (Signed)

## 2017-10-18 DIAGNOSIS — Z79899 Other long term (current) drug therapy: Secondary | ICD-10-CM | POA: Diagnosis not present

## 2017-10-18 DIAGNOSIS — B9681 Helicobacter pylori [H. pylori] as the cause of diseases classified elsewhere: Secondary | ICD-10-CM | POA: Diagnosis not present

## 2017-10-18 DIAGNOSIS — E785 Hyperlipidemia, unspecified: Secondary | ICD-10-CM | POA: Diagnosis not present

## 2017-10-18 DIAGNOSIS — R739 Hyperglycemia, unspecified: Secondary | ICD-10-CM | POA: Diagnosis not present

## 2017-10-18 DIAGNOSIS — K257 Chronic gastric ulcer without hemorrhage or perforation: Secondary | ICD-10-CM | POA: Diagnosis not present

## 2017-10-18 DIAGNOSIS — Z952 Presence of prosthetic heart valve: Secondary | ICD-10-CM | POA: Diagnosis not present

## 2017-10-18 DIAGNOSIS — Z0000 Encounter for general adult medical examination without abnormal findings: Secondary | ICD-10-CM | POA: Diagnosis not present

## 2017-10-18 DIAGNOSIS — N400 Enlarged prostate without lower urinary tract symptoms: Secondary | ICD-10-CM | POA: Diagnosis not present

## 2017-10-18 DIAGNOSIS — I10 Essential (primary) hypertension: Secondary | ICD-10-CM | POA: Diagnosis not present

## 2018-05-10 DIAGNOSIS — Z6832 Body mass index (BMI) 32.0-32.9, adult: Secondary | ICD-10-CM | POA: Diagnosis not present

## 2018-05-10 DIAGNOSIS — Z1339 Encounter for screening examination for other mental health and behavioral disorders: Secondary | ICD-10-CM | POA: Diagnosis not present

## 2018-05-10 DIAGNOSIS — H6092 Unspecified otitis externa, left ear: Secondary | ICD-10-CM | POA: Diagnosis not present

## 2018-10-19 ENCOUNTER — Ambulatory Visit: Payer: BC Managed Care – PPO | Admitting: Interventional Cardiology

## 2018-12-03 ENCOUNTER — Telehealth: Payer: Self-pay | Admitting: Interventional Cardiology

## 2018-12-03 NOTE — Telephone Encounter (Signed)
Called and spoke to patient. Made him aware that I can send letter written below but could not say that he is sick. Patient would like letter written below. Will send to patient per patient's request.

## 2018-12-03 NOTE — Telephone Encounter (Signed)
He a heart valve problem that was stable. I can write a note stating he is elderly and has a heart problem, but have to indication that he is sick.  To whom it may concern,  Mr.Najib C. Joya Gaskins (1946/09/08) has an underlying valvular heart problem, hypertension, and a rhythm problem.  If I may provide further information, do not hesitate to call me.  Sincerely,   Illene Labrador, III, MD

## 2018-12-03 NOTE — Telephone Encounter (Signed)
Returned call to patient. 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 Patient states that due to his age and condition he does not want to go in and be exposed. Patient states that if he does not work then he will have to use his regular bank of hours to get paid. Patient states that he does not have many of those left but he has plenty of his sick leave hours left. He states that since he works in [employer] they will not allow him to use his sick leave hours unless they have a letter from the doctor. Patient is requesting that Dr. Tamala Julian write him a letter.

## 2018-12-03 NOTE — Telephone Encounter (Signed)
New Message:     Pt needs a note for work, so he can use sick days> Pt called his primary doctor for this note and he referred him to call his Cardiologist. Pt works for [employer]. Pt says he does not need to be in that environment, because of his age and his condition.

## 2018-12-16 ENCOUNTER — Telehealth: Payer: Self-pay | Admitting: Interventional Cardiology

## 2018-12-16 NOTE — Telephone Encounter (Signed)
Okay to set virtual visit with APP/Jill or me

## 2018-12-17 NOTE — Telephone Encounter (Signed)
Left message to call back  

## 2018-12-18 NOTE — Telephone Encounter (Signed)
Spoke with pt and he is agreeable to try a WebEx visit.  Advised pt someone would call him to get everything set up.  Pt in agreement with plan.

## 2018-12-18 NOTE — Telephone Encounter (Signed)
Spoke to pt and his Wife. Sent link to set up MyChart account. Explained how to download and use WebEx. I will give the pt time to get his MyChart account set up then will send consent form through that. Pt agreed to virtual visit with Dr Tamala Julian.

## 2018-12-18 NOTE — Telephone Encounter (Signed)
Follow Up: ° ° ° °Returning your call from yesterday. °

## 2018-12-24 ENCOUNTER — Telehealth: Payer: BC Managed Care – PPO | Admitting: Interventional Cardiology

## 2018-12-24 ENCOUNTER — Encounter

## 2018-12-28 DIAGNOSIS — E785 Hyperlipidemia, unspecified: Secondary | ICD-10-CM | POA: Diagnosis not present

## 2018-12-28 DIAGNOSIS — R739 Hyperglycemia, unspecified: Secondary | ICD-10-CM | POA: Diagnosis not present

## 2018-12-28 DIAGNOSIS — Z0000 Encounter for general adult medical examination without abnormal findings: Secondary | ICD-10-CM | POA: Diagnosis not present

## 2018-12-28 DIAGNOSIS — Z79899 Other long term (current) drug therapy: Secondary | ICD-10-CM | POA: Diagnosis not present

## 2018-12-28 DIAGNOSIS — M170 Bilateral primary osteoarthritis of knee: Secondary | ICD-10-CM | POA: Diagnosis not present

## 2018-12-28 DIAGNOSIS — Z125 Encounter for screening for malignant neoplasm of prostate: Secondary | ICD-10-CM | POA: Diagnosis not present

## 2018-12-28 DIAGNOSIS — I10 Essential (primary) hypertension: Secondary | ICD-10-CM | POA: Diagnosis not present

## 2018-12-28 DIAGNOSIS — K219 Gastro-esophageal reflux disease without esophagitis: Secondary | ICD-10-CM | POA: Diagnosis not present

## 2018-12-28 DIAGNOSIS — Z952 Presence of prosthetic heart valve: Secondary | ICD-10-CM | POA: Diagnosis not present

## 2019-01-08 DIAGNOSIS — M25562 Pain in left knee: Secondary | ICD-10-CM | POA: Diagnosis not present

## 2019-01-08 DIAGNOSIS — G8929 Other chronic pain: Secondary | ICD-10-CM

## 2019-01-08 HISTORY — DX: Other chronic pain: G89.29

## 2019-01-14 ENCOUNTER — Telehealth: Payer: Self-pay

## 2019-01-14 NOTE — Telephone Encounter (Signed)
   [facility] Pre-operative Risk Assessment    Request for surgical clearance:  1. What type of surgery is being performed? Left TKA   2. When is this surgery scheduled? TBD   3. What type of clearance is required (medical clearance vs. Pharmacy clearance to hold med vs. Both)? Medical  4. Are there any medications that need to be held prior to surgery and how long?   5. Practice name and name of physician performing surgery? [facility]   6. What is your office phone number [phone]   7.   What is your office fax number [phone]  8.   Anesthesia type (None, local, MAC, general) ?

## 2019-01-15 NOTE — Telephone Encounter (Signed)
   Primary Cardiologist:Henry Nicholes Stairs III, MD  Chart reviewed as part of pre-operative protocol coverage. Because of Douglas Hart's past medical history and time since last visit, he/she will require a follow-up visit in order to better assess preoperative cardiovascular risk.  Pre-op covering staff: - Please schedule appointment and call patient to inform them. - Please contact requesting surgeon's office via preferred method (i.e, phone, fax) to inform them of need for appointment prior to surgery.    Abigail Butts, PA-C  01/15/2019, 7:40 AM

## 2019-01-17 ENCOUNTER — Encounter: Payer: Self-pay | Admitting: *Deleted

## 2019-01-17 ENCOUNTER — Telehealth: Payer: Self-pay | Admitting: Cardiology

## 2019-01-17 NOTE — Telephone Encounter (Signed)
Spoke with patient who confirmed all demographics.. Patient does not have a smart phone. Will have vitals ready for visit.

## 2019-01-17 NOTE — Telephone Encounter (Signed)
Pt has been scheduled for virtual phone visit with Douglas Drown, NP, 01/22/2019 9:00. Pt has already received instructions.     TELEPHONE CALL NOTE  This patient has been deemed a candidate for follow-up tele-health visit to limit community exposure during the Covid-19 pandemic. I spoke with the patient via phone to discuss instructions. This has been outlined on the patient's AVS (dotphrase: [email or web address]). The patient was advised to review the section on consent for treatment as well. The patient will receive a phone call 2-3 days prior to their E-Visit at which time consent will be verbally confirmed.   A Virtual Office Visit appointment type has been scheduled for VIRTUAL VISIT with Douglas MCDANIEL, NP, with "VIDEO" or "TELEPHONE" in the appointment notes - patient prefers PHONE. PT HAS NO SMART PHONE type.  I have either confirmed the patient is active in MyChart or offered to send sign-up link to phone/email via Mychart icon beside patient's photo.YES.[name] VIRTUAL CONSENT TO MYCHART.  Douglas Hart, [place] 01/17/2019 9:02 AM

## 2019-01-21 NOTE — Progress Notes (Signed)
Virtual Visit via Telephone Note   This visit type was conducted due to national recommendations for restrictions regarding the COVID-19 Pandemic (e.g. social distancing) in an effort to limit this patient's exposure and mitigate transmission in our community.  Due to his co-morbid illnesses, this patient is at least at moderate risk for complications without adequate follow up.  This format is felt to be most appropriate for this patient at this time.  The patient did not have access to video technology/had technical difficulties with video requiring transitioning to audio format only (telephone).  All issues noted in this document were discussed and addressed.  No physical exam could be performed with this format.  Please refer to the patient's chart for his  consent to telehealth for [facility].   Date:  01/22/2019   ID:  Douglas Hart, DOB 1946/09/09, MRN [id number]  Patient Location: Home Provider Location: Home  PCP:  Street, Sharon Mt, MD  Cardiologist:  [name], MD  Electrophysiologist:  None   Evaluation Performed:  Follow-Up Visit  Chief Complaint: f/u of bioprosthetic aortic valve replacement, preoperative assessment  History of Present Illness:    Douglas Hart is a 73 y.o. male with history of bioprosthetic aortic valve replacement in 2008 per Dr. [name], postoperative atrial flutter, hypertension and hyperlipidemia.  Douglas Hart was last seen by Dr. Tamala Julian 09/21/2017 and was doing well from a cardiac perspective.  He had no complaints of syncope, chest pain, orthopnea or palpitations.  Douglas Hart is being seen today for follow-up of aortic valve replacement as well as preoperative assessment for a left TKA.  He reports that he continues to do well from a cardiac perspective.  He continues to perform ADLs/IADLs without anginal or heart failure complication including moderately strenuous yard work and indoor/outdoor chores. He is having no signs of valve  failure including shortness of breath, abnormal LE swelling, orthopnea symptoms, dizziness or syncope.  He was last seen by Dr. Tamala Julian 09/2017 and is scheduled 04/24/2023 and an office visit for valve evaluation including auscultation and possible imaging.  His BP is very well controlled today.  He reports medication compliance.  He states that most of his discomfort is located in his left knee which is limiting some of his mobility.  He was seen by Dr. Wynelle Link and based on imaging was felt to need a total knee replacement with procedure date pending.   The patient does not have symptoms concerning for COVID-19 infection (fever, chills, cough, or new shortness of breath).   Past Medical History:  Diagnosis Date  . Asthma   . Fatigue   . Hearing loss   . Hypertension   . Knee pain   . Muscle pain    Past Surgical History:  Procedure Laterality Date  . CARDIAC SURGERY       Current Meds  Medication Sig  . amLODipine (NORVASC) 5 MG tablet Take 1 tablet (5 mg total) by mouth daily.  [name] amoxicillin (AMOXIL) 500 MG capsule Take 2,000 mg by mouth as directed. Take 4 capsules 1 hour prior to dental appointments  . furosemide (LASIX) 40 MG tablet Take 1 tablet (40 mg total) by mouth daily.  [name] lisinopril (PRINIVIL,ZESTRIL) 40 MG tablet Take 1 tablet (40 mg total) by mouth daily.  . metoprolol succinate (TOPROL-XL) 50 MG 24 hr tablet Take 1 tablet by mouth daily with or immediately following a meal.  . potassium chloride SA (K-DUR,KLOR-CON) 20 MEQ tablet Take 1 tablet (20  mEq total) by mouth daily.     Allergies:   Patient has no known allergies.   Social History   Tobacco Use  . Smoking status: Never Smoker  . Smokeless tobacco: Never Used  Substance Use Topics  . Alcohol use: No  . Drug use: No    Family Hx: The patient's family history includes Alzheimer's disease in his mother; Heart disease in his brother; Heart disease (age of onset: 66) in his father; Hypertension in his brother and  father; Leukemia (age of onset: 36) in his brother; Other in his daughter, daughter, and sister. There is no history of Heart attack or Stroke.  ROS:   Please see the history of present illness.     All other systems reviewed and are negative.  Prior CV studies:   The following studies were reviewed today:  Echocardiogram 11/2016:  Study Conclusions  - Left ventricle: The cavity size was normal. Wall thickness was increased in a pattern of mild LVH. Systolic function was normal. The estimated ejection fraction was in the range of 55% to 60%. Wall motion was normal; there were no regional wall motion abnormalities. Features are consistent with a pseudonormal left ventricular filling pattern, with concomitant abnormal relaxation and increased filling pressure (grade 2 diastolic dysfunction). - Aortic valve: A bioprosthesis was present. There was trivial regurgitation. - Ascending aorta: The ascending aorta was mildly dilated. - Left atrium: The atrium was moderately dilated.  Impressions:  - Normal LV systolic function; grade 2 diastolic dysfunction; s/p AVR with mildly elevated gradient (21 mmHg) and trace AI; moderate LAE.  Labs/Other Tests and Data Reviewed:    EKG:  An ECG dated 09/21/2017 was personally reviewed today and demonstrated:  09/21/2017  Recent Labs: No results found for requested labs within last 8760 hours.   Recent Lipid Panel No results found for: CHOL, TRIG, HDL, CHOLHDL, LDLCALC, LDLDIRECT  Wt Readings from Last 3 Encounters:  01/22/19 241 lb (109.3 kg)  09/21/17 256 lb 9.6 oz (116.4 kg)  04/11/16 257 lb (116.6 kg)     Objective:    Vital Signs:  BP 128/72   Pulse 60   Ht 6\' 1"  (1.854 m)   Wt 241 lb (109.3 kg)   BMI 31.80 kg/m    VITAL SIGNS:  reviewed GEN:  no acute distress RESPIRATORY:  normal respiratory effort, symmetric expansion NEURO:  alert and oriented x 3, no obvious focal deficit PSYCH:  normal affect   ASSESSMENT & PLAN:    1.  Hypertension: -Well controlled, 128/72 -Continue amlodipine 5, lisinopril 40, Toprol 50  2.  History of aortic valve replacement 2008: -Stable, no issues>denies LE swelling, shortness of breath orthopnea symptoms -Last echocardiogram 11/2016 with mildly elevated gradient at 21 mmHg and trace AI and grade 2 DD -On home Lasix 40 mg daily -Pt to see Dr. Tamala Julian 04/24/2019 and will need follow-up routine echocardiogram this year   3.  Preoperative assessment for left TKA: -According to the [facility] activity status index, the patient performs 8.23 METS without complication including anginal symptoms, exertional shortness of breath above baseline, dizziness or syncope -Therefore, according to the revised cardiac risk index the patient has a 0.4% risk of major cardiac event indicating that there is no further cardiac testing needed at this time and the patient can proceed for surgery -Will forward note to primary cardiologist as well as Dr. Wynelle Link  4.  HLD: -Stable, last LDL, 119 on 12/28/2018 -Managed per primary care provider  5.  History of  postoperative atrial flutter: -Patient wore a 48-hour Holter monitor 11/21/2016 which showed no significant rhythm disturbances and no atrial fibrillation -No need for anticoagulation   COVID-19 Education: The signs and symptoms of COVID-19 were discussed with the patient and how to seek care for testing (follow up with PCP or arrange E-visit). The importance of social distancing was discussed today.  Time:   Today, I have spent 25 minutes with the patient with telehealth technology discussing the above problems.     Medication Adjustments/Labs and Tests Ordered: Current medicines are reviewed at length with the patient today.  Concerns regarding medicines are outlined above.   Tests Ordered: No orders of the defined types were placed in this encounter.   Medication Changes: No orders of the defined types were placed in this  encounter.   Disposition:  Follow up Dr. Tamala Julian for in office visit 04/24/2019  Signed, Kathyrn Drown, NP  01/22/2019 9:16 AM    [place]

## 2019-01-22 ENCOUNTER — Encounter: Payer: Self-pay | Admitting: Cardiology

## 2019-01-22 ENCOUNTER — Telehealth (INDEPENDENT_AMBULATORY_CARE_PROVIDER_SITE_OTHER): Payer: BC Managed Care – PPO | Admitting: Cardiology

## 2019-01-22 ENCOUNTER — Other Ambulatory Visit: Payer: Self-pay

## 2019-01-22 VITALS — BP 128/72 | HR 60 | Ht 73.0 in | Wt 241.0 lb

## 2019-01-22 DIAGNOSIS — I10 Essential (primary) hypertension: Secondary | ICD-10-CM | POA: Diagnosis not present

## 2019-01-22 DIAGNOSIS — Z01810 Encounter for preprocedural cardiovascular examination: Secondary | ICD-10-CM

## 2019-01-22 DIAGNOSIS — I4892 Unspecified atrial flutter: Secondary | ICD-10-CM

## 2019-01-22 DIAGNOSIS — Z952 Presence of prosthetic heart valve: Secondary | ICD-10-CM

## 2019-01-22 DIAGNOSIS — E782 Mixed hyperlipidemia: Secondary | ICD-10-CM

## 2019-01-22 NOTE — Patient Instructions (Signed)
Medication Instructions:  Your physician recommends that you continue on your current medications as directed. Please refer to the Current Medication list given to you today.  If you need a refill on your cardiac medications before your next appointment, please call your pharmacy.   Lab work: None  If you have labs (blood work) drawn today and your tests are completely normal, you will receive your results only by: [name] MyChart Message (if you have MyChart) OR . A paper copy in the mail If you have any lab test that is abnormal or we need to change your treatment, we will call you to review the results.  Testing/Procedures: None  Follow-Up: Keep follow up appointment with Dr. Tamala Julian on 04/24/2019 @ 8:40 AM

## 2019-02-05 DIAGNOSIS — Z86010 Personal history of colonic polyps: Secondary | ICD-10-CM | POA: Diagnosis not present

## 2019-02-05 DIAGNOSIS — R1900 Intra-abdominal and pelvic swelling, mass and lump, unspecified site: Secondary | ICD-10-CM | POA: Diagnosis not present

## 2019-02-05 DIAGNOSIS — R142 Eructation: Secondary | ICD-10-CM | POA: Diagnosis not present

## 2019-02-05 DIAGNOSIS — R140 Abdominal distension (gaseous): Secondary | ICD-10-CM | POA: Diagnosis not present

## 2019-02-19 DIAGNOSIS — R1900 Intra-abdominal and pelvic swelling, mass and lump, unspecified site: Secondary | ICD-10-CM | POA: Diagnosis not present

## 2019-02-25 ENCOUNTER — Other Ambulatory Visit: Payer: Self-pay | Admitting: Orthopedic Surgery

## 2019-03-05 ENCOUNTER — Telehealth: Payer: Self-pay | Admitting: *Deleted

## 2019-03-05 NOTE — Telephone Encounter (Signed)
   Primary Cardiologist: [name], MD  Chart reviewed as part of pre-operative protocol coverage. Patient was contacted 03/05/2019 in reference to pre-operative risk assessment for pending surgery as outlined below.  [name] was last seen on 01/22/19 via virtual visit with Kathyrn Drown and was doing well at that time. She cleared him for knee surgery based on that visit. I reached out to patient to ensure no changes since that visit, got VM. LMOM to call us back.  Charlie Pitter, PA-C 03/05/2019, 2:58 PM

## 2019-03-05 NOTE — Telephone Encounter (Signed)
   [facility] Pre-operative Risk Assessment    Request for surgical clearance:  1. What type of surgery is being performed? COLONOSCOPY  2. When is this surgery scheduled? TBD  3. What type of clearance is required (medical clearance vs. Pharmacy clearance to hold med vs. Both)? MEDICAL  4. Are there any medications that need to be held prior to surgery and how long? NONE LISTED  5. Practice name and name of physician performing surgery? [place]; DR. Herbie Baltimore Hart  6. What is your office phone number [phone]    7.   What is your office fax number [phone]  8.   Anesthesia type (None, local, MAC, general) ? PROPOFOL ?     Douglas Hart 03/05/2019, 2:41 PM  _________________________________________________________________   (provider comments below)

## 2019-03-06 NOTE — Telephone Encounter (Signed)
Left message, will try again tomorrow morning.

## 2019-03-06 NOTE — Telephone Encounter (Signed)
I called the patient but got the answering machine, will try again in 1 hour

## 2019-03-06 NOTE — Telephone Encounter (Signed)
Patient returned call

## 2019-03-07 NOTE — Telephone Encounter (Signed)
   Primary Cardiologist: [name], MD  Chart reviewed as part of pre-operative protocol coverage. Patient was contacted 03/07/2019 in reference to pre-operative risk assessment for pending surgery as outlined below.  Douglas Hart was last seen on 01/22/2019 by Kathyrn Drown NP.  Since that day, Douglas Hart has done well without any chest pain or shortness of breath.  Therefore, based on ACC/AHA guidelines, the patient would be at acceptable risk for the planned procedure without further cardiovascular testing.   I will route this recommendation to the requesting party via Epic fax function and remove from pre-op pool.  Please call with questions.  [place], [place] 03/07/2019, 9:58 AM

## 2019-03-08 DIAGNOSIS — B3781 Candidal esophagitis: Secondary | ICD-10-CM | POA: Diagnosis not present

## 2019-03-08 DIAGNOSIS — R142 Eructation: Secondary | ICD-10-CM | POA: Diagnosis not present

## 2019-03-08 DIAGNOSIS — K219 Gastro-esophageal reflux disease without esophagitis: Secondary | ICD-10-CM | POA: Diagnosis not present

## 2019-03-08 DIAGNOSIS — K2970 Gastritis, unspecified, without bleeding: Secondary | ICD-10-CM | POA: Diagnosis not present

## 2019-03-08 DIAGNOSIS — Z86010 Personal history of colonic polyps: Secondary | ICD-10-CM | POA: Diagnosis not present

## 2019-03-08 DIAGNOSIS — Z1211 Encounter for screening for malignant neoplasm of colon: Secondary | ICD-10-CM | POA: Diagnosis not present

## 2019-03-14 ENCOUNTER — Other Ambulatory Visit (HOSPITAL_COMMUNITY): Payer: BC Managed Care – PPO

## 2019-03-18 ENCOUNTER — Ambulatory Visit
Admission: RE | Admit: 2019-03-18 | Payer: BC Managed Care – PPO | Source: Ambulatory Visit | Admitting: Orthopedic Surgery

## 2019-03-18 ENCOUNTER — Encounter: Admission: RE | Payer: Self-pay | Source: Ambulatory Visit

## 2019-03-18 SURGERY — ARTHROPLASTY, KNEE, TOTAL
Anesthesia: Spinal | Laterality: Left

## 2019-04-12 ENCOUNTER — Telehealth: Payer: Self-pay

## 2019-04-12 NOTE — Telephone Encounter (Signed)
Called pt to switch 8/12 OV with Dr Tamala Julian to a VV. No answer.

## 2019-04-18 NOTE — Telephone Encounter (Signed)
Spoke with pt yesterday and he was agreeable to do a phone visit.

## 2019-04-23 ENCOUNTER — Other Ambulatory Visit: Payer: Self-pay | Admitting: Interventional Cardiology

## 2019-04-23 NOTE — Progress Notes (Signed)
Virtual Visit via Video Note   This visit type was conducted due to national recommendations for restrictions regarding the COVID-19 Pandemic (e.g. social distancing) in an effort to limit this patient's exposure and mitigate transmission in our community.  Due to his co-morbid illnesses, this patient is at least at moderate risk for complications without adequate follow up.  This format is felt to be most appropriate for this patient at this time.  All issues noted in this document were discussed and addressed.  A limited physical exam was performed with this format.  Please refer to the patient's chart for his consent to telehealth for [facility].   Date:  04/24/2019    ID:  Douglas Hart, DOB [birth year], MRN [id number]  Patient Location: Home Provider Location: Home  PCP:  Street, Sharon Mt, MD  Cardiologist:  [name], MD  Electrophysiologist:  None   Evaluation Performed:  Follow-Up Visit  Chief Complaint:  PAF  History of Present Illness:    Douglas Hart is a 73 y.o. male with history ofbioprosthetic aortic valve replacement in 2008 by Dr. [name], post-op atrial flutter, hypertension, and  hyperlipidemia.  He is doing well.  He is not having angina.  He denies dyspnea, palpitations, syncope, and neurological complaints.  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  We reviewed COVID-19 avoidance strategies including handwashing, masking, and social distancing.  The patient does not have symptoms concerning for COVID-19 infection (fever, chills, cough, or new shortness of breath).    Past Medical History:  Diagnosis Date  . Asthma   . Fatigue   . Hearing loss   . Hypertension   . Knee pain   . Muscle pain    Past Surgical History:  Procedure Laterality Date  . CARDIAC SURGERY       Current Meds  Medication Sig  . amLODipine (NORVASC) 5 MG tablet Take 1 tablet (5 mg total) by mouth daily.  [name] amoxicillin (AMOXIL) 500 MG  capsule Take 2,000 mg by mouth as directed. Take 4 capsules 1 hour prior to dental appointments  . furosemide (LASIX) 40 MG tablet Take 1 tablet (40 mg total) by mouth daily.  [name] lisinopril (PRINIVIL,ZESTRIL) 40 MG tablet Take 1 tablet (40 mg total) by mouth daily.  . potassium chloride SA (K-DUR,KLOR-CON) 20 MEQ tablet Take 1 tablet (20 mEq total) by mouth daily.  . [DISCONTINUED] metoprolol succinate (TOPROL-XL) 50 MG 24 hr tablet Take 1 tablet by mouth daily with or immediately following a meal.     Allergies:   Patient has no known allergies.   Social History   Tobacco Use  . Smoking status: Never Smoker  . Smokeless tobacco: Never Used  Substance Use Topics  . Alcohol use: No  . Drug use: No     Family Hx: The patient's family history includes Alzheimer's disease in his mother; Heart disease in his brother; Heart disease (age of onset: 3) in his father; Hypertension in his brother and father; Leukemia (age of onset: 33) in his brother; Other in his daughter, daughter, and sister. There is no history of Heart attack or Stroke.  ROS:   Please see the history of present illness.    His knee gives him trouble but he has decided not to undergo knee replacement surgery at this time. All other systems reviewed and are negative.   Prior CV studies:   The following studies were reviewed today:  Last echocardiogram 2018.  Aortic valve  replacement 2008.  Labs/Other Tests and Data Reviewed:    EKG:  No ECG reviewed.  Recent Labs: No results found for requested labs within last 8760 hours.   Recent Lipid Panel No results found for: CHOL, TRIG, HDL, CHOLHDL, LDLCALC, LDLDIRECT  Wt Readings from Last 3 Encounters:  04/24/19 245 lb (111.1 kg)  01/22/19 241 lb (109.3 kg)  09/21/17 256 lb 9.6 oz (116.4 kg)     Objective:    Vital Signs:  BP 134/76   Pulse 76   Ht 6\' 1"  (1.854 m)   Wt 245 lb (111.1 kg)   BMI 32.32 kg/m    VITAL SIGNS:  reviewed  ASSESSMENT & PLAN:     1. H/O aortic valve replacement   2. Essential hypertension   3. Mixed hyperlipidemia   4. Atrial flutter, unspecified type ([place])   5. Educated About Covid-19 Virus Infection    PLAN:  1. Will evaluate aortic valve prosthesis with echocardiogram in early 2021. 2. Blood pressure control is adequate. 3. LDL target less than 70. 4. No history of palpitations or irregular heartbeat since surgery. 5. COVID-19 avoidance strategies were discussed in detail.  COVID-19 Education: The signs and symptoms of COVID-19 were discussed with the patient and how to seek care for testing (follow up with PCP or arrange E-visit).  The importance of social distancing was discussed today.  Time:   Today, I have spent 15 minutes with the patient with telehealth technology discussing the above problems.     Medication Adjustments/Labs and Tests Ordered: Current medicines are reviewed at length with the patient today.  Concerns regarding medicines are outlined above.   Tests Ordered: No orders of the defined types were placed in this encounter.   Medication Changes: No orders of the defined types were placed in this encounter.   Follow Up:  In Person in 55 month(s)  Signed, [name], MD  04/24/2019 10:16 AM    [place]

## 2019-04-24 ENCOUNTER — Telehealth (INDEPENDENT_AMBULATORY_CARE_PROVIDER_SITE_OTHER): Payer: BC Managed Care – PPO | Admitting: Interventional Cardiology

## 2019-04-24 ENCOUNTER — Encounter: Payer: Self-pay | Admitting: Interventional Cardiology

## 2019-04-24 ENCOUNTER — Other Ambulatory Visit: Payer: Self-pay

## 2019-04-24 VITALS — BP 134/76 | HR 76 | Ht 73.0 in | Wt 245.0 lb

## 2019-04-24 DIAGNOSIS — Z952 Presence of prosthetic heart valve: Secondary | ICD-10-CM | POA: Diagnosis not present

## 2019-04-24 DIAGNOSIS — I4892 Unspecified atrial flutter: Secondary | ICD-10-CM

## 2019-04-24 DIAGNOSIS — E782 Mixed hyperlipidemia: Secondary | ICD-10-CM

## 2019-04-24 DIAGNOSIS — I10 Essential (primary) hypertension: Secondary | ICD-10-CM

## 2019-04-24 DIAGNOSIS — Z7189 Other specified counseling: Secondary | ICD-10-CM

## 2019-04-24 NOTE — Patient Instructions (Signed)
Medication Instructions:  Your physician recommends that you continue on your current medications as directed. Please refer to the Current Medication list given to you today.  If you need a refill on your cardiac medications before your next appointment, please call your pharmacy.   Lab work: None If you have labs (blood work) drawn today and your tests are completely normal, you will receive your results only by: [name] MyChart Message (if you have MyChart) OR . A paper copy in the mail If you have any lab test that is abnormal or we need to change your treatment, we will call you to review the results.  Testing/Procedures: Your physician has requested that you have an echocardiogram just prior to seeing Dr. Tamala Julian back in 6-9 months. Echocardiography is a painless test that uses sound waves to create images of your heart. It provides your doctor with information about the size and shape of your heart and how well your heart's chambers and valves are working. This procedure takes approximately one hour. There are no restrictions for this procedure.    Follow-Up: At [facility], you and your health needs are our priority.  As part of our continuing mission to provide you with exceptional heart care, we have created designated Provider Care Teams.  These Care Teams include your primary Cardiologist (physician) and Advanced Practice Providers (APPs -  Physician Assistants and Nurse Practitioners) who all work together to provide you with the care you need, when you need it. You will need a follow up appointment in 6-9 months.  Please call our office 2 months in advance to schedule this appointment.  You may see [name], MD or one of the following Advanced Practice Providers on your designated Care Team:   Truitt Merle, NP Cecilie Kicks, NP . Kathyrn Drown, NP  Any Other Special Instructions Will Be Listed Below (If Applicable).

## 2019-04-24 NOTE — Addendum Note (Signed)
Addended by: Loren Racer on: 04/24/2019 10:20 AM   Modules accepted: Orders

## 2019-06-13 DIAGNOSIS — R39198 Other difficulties with micturition: Secondary | ICD-10-CM | POA: Insufficient documentation

## 2019-06-13 DIAGNOSIS — R351 Nocturia: Secondary | ICD-10-CM | POA: Insufficient documentation

## 2019-06-13 DIAGNOSIS — R972 Elevated prostate specific antigen [PSA]: Secondary | ICD-10-CM

## 2019-06-13 HISTORY — DX: Elevated prostate specific antigen (PSA): R97.20

## 2019-06-13 HISTORY — DX: Other difficulties with micturition: R39.198

## 2019-06-13 HISTORY — DX: Nocturia: R35.1

## 2019-07-15 DIAGNOSIS — H2512 Age-related nuclear cataract, left eye: Secondary | ICD-10-CM | POA: Diagnosis not present

## 2019-07-29 DIAGNOSIS — H2512 Age-related nuclear cataract, left eye: Secondary | ICD-10-CM | POA: Diagnosis not present

## 2019-07-29 DIAGNOSIS — H25812 Combined forms of age-related cataract, left eye: Secondary | ICD-10-CM | POA: Diagnosis not present

## 2019-07-31 ENCOUNTER — Telehealth: Payer: Self-pay | Admitting: Interventional Cardiology

## 2019-07-31 NOTE — Telephone Encounter (Signed)
New Message:    Pt wanted you to know he has an appointment with his Primary Doctor on 08-13-19.

## 2019-07-31 NOTE — Telephone Encounter (Signed)
° °  [facility] Pre-operative Risk Assessment    Request for surgical clearance:  1. What type of surgery is being performed? Left Knee Arthroplasty (total knee replacement)  2. When is this surgery scheduled? TBD  3. What type of clearance is required (medical clearance vs. Pharmacy clearance to hold med vs. Both)? Medical  4. Are there any medications that need to be held prior to surgery and how long? N/A  5. Practice name and name of physician performing surgery? [place], Dr. Lara Mulch  6. What is your office phone number [phone]   7.   What is your office fax number [phone]  8.   Anesthesia type (None, local, MAC, general) ? Unsure, states it says "Choice"   Douglas Hart 07/31/2019, 10:51 AM  _________________________________________________________________   (provider comments below)

## 2019-07-31 NOTE — Telephone Encounter (Signed)
   Primary Cardiologist: [name], MD  Chart reviewed as part of pre-operative protocol coverage. Patient has history of AVR in 2008. Most recent Echo from 11/2016 showed normal LV systolic function, grade 2 diastolic dysfunciton, s/p AVR with mildly elevated gradient (21 mmHg) and trace AI, and moderate LAE. He was last seen by Dr. Tamala Julian in 04/2019 for a telehealth visit at which time he was doing well. Plan was for repeat Echo in early 2021. Called and spoke with patient today. He has done well since telehealth visit. No chest pain, shortness of breath, palpitations, syncope, or acute CHF symptoms. Able to complete >4.0 METS. Discussed with Dr. Tamala Julian who did not feel like it was necessary to get repeat Echo prior to surgery. Given past medical history and time since last visit, based on ACC/AHA guidelines, Douglas Hart would be at acceptable risk for the planned procedure without further cardiovascular testing.   I will route this recommendation to the requesting party via Epic fax function and remove from pre-op pool.  Please call with questions.  Darreld Mclean, PA-C 07/31/2019, 12:37 PM

## 2019-08-12 DIAGNOSIS — H2511 Age-related nuclear cataract, right eye: Secondary | ICD-10-CM | POA: Diagnosis not present

## 2019-08-12 DIAGNOSIS — H25811 Combined forms of age-related cataract, right eye: Secondary | ICD-10-CM | POA: Diagnosis not present

## 2019-09-19 ENCOUNTER — Other Ambulatory Visit: Payer: Self-pay | Admitting: Interventional Cardiology

## 2019-11-14 DIAGNOSIS — J90 Pleural effusion, not elsewhere classified: Secondary | ICD-10-CM | POA: Diagnosis present

## 2019-11-14 DIAGNOSIS — J189 Pneumonia, unspecified organism: Secondary | ICD-10-CM | POA: Diagnosis not present

## 2019-11-14 DIAGNOSIS — I340 Nonrheumatic mitral (valve) insufficiency: Secondary | ICD-10-CM

## 2019-11-14 DIAGNOSIS — R778 Other specified abnormalities of plasma proteins: Secondary | ICD-10-CM | POA: Diagnosis not present

## 2019-11-14 DIAGNOSIS — I352 Nonrheumatic aortic (valve) stenosis with insufficiency: Secondary | ICD-10-CM | POA: Diagnosis not present

## 2019-11-14 DIAGNOSIS — Z79899 Other long term (current) drug therapy: Secondary | ICD-10-CM | POA: Diagnosis not present

## 2019-11-14 DIAGNOSIS — J45909 Unspecified asthma, uncomplicated: Secondary | ICD-10-CM | POA: Diagnosis present

## 2019-11-14 DIAGNOSIS — I252 Old myocardial infarction: Secondary | ICD-10-CM | POA: Diagnosis not present

## 2019-11-14 DIAGNOSIS — R079 Chest pain, unspecified: Secondary | ICD-10-CM | POA: Diagnosis not present

## 2019-11-14 DIAGNOSIS — R918 Other nonspecific abnormal finding of lung field: Secondary | ICD-10-CM | POA: Diagnosis not present

## 2019-11-14 DIAGNOSIS — G4733 Obstructive sleep apnea (adult) (pediatric): Secondary | ICD-10-CM | POA: Diagnosis present

## 2019-11-14 DIAGNOSIS — I10 Essential (primary) hypertension: Secondary | ICD-10-CM | POA: Diagnosis present

## 2019-11-14 DIAGNOSIS — I361 Nonrheumatic tricuspid (valve) insufficiency: Secondary | ICD-10-CM

## 2019-11-14 DIAGNOSIS — R0602 Shortness of breath: Secondary | ICD-10-CM | POA: Diagnosis not present

## 2019-11-14 DIAGNOSIS — J9601 Acute respiratory failure with hypoxia: Secondary | ICD-10-CM | POA: Diagnosis not present

## 2019-11-14 DIAGNOSIS — Z954 Presence of other heart-valve replacement: Secondary | ICD-10-CM | POA: Diagnosis not present

## 2019-11-15 DIAGNOSIS — J90 Pleural effusion, not elsewhere classified: Secondary | ICD-10-CM | POA: Diagnosis not present

## 2019-11-15 DIAGNOSIS — I10 Essential (primary) hypertension: Secondary | ICD-10-CM | POA: Diagnosis not present

## 2019-11-15 DIAGNOSIS — J9601 Acute respiratory failure with hypoxia: Secondary | ICD-10-CM | POA: Diagnosis not present

## 2019-11-16 DIAGNOSIS — J90 Pleural effusion, not elsewhere classified: Secondary | ICD-10-CM | POA: Diagnosis not present

## 2019-11-16 DIAGNOSIS — I10 Essential (primary) hypertension: Secondary | ICD-10-CM | POA: Diagnosis not present

## 2019-11-16 DIAGNOSIS — J9601 Acute respiratory failure with hypoxia: Secondary | ICD-10-CM | POA: Diagnosis not present

## 2019-11-17 DIAGNOSIS — J9601 Acute respiratory failure with hypoxia: Secondary | ICD-10-CM | POA: Diagnosis not present

## 2019-11-17 DIAGNOSIS — I10 Essential (primary) hypertension: Secondary | ICD-10-CM | POA: Diagnosis not present

## 2019-11-17 DIAGNOSIS — J90 Pleural effusion, not elsewhere classified: Secondary | ICD-10-CM | POA: Diagnosis not present

## 2019-11-18 ENCOUNTER — Telehealth: Payer: Self-pay | Admitting: Interventional Cardiology

## 2019-11-18 NOTE — Telephone Encounter (Signed)
Message sent to [facility] to obtain copy of echo

## 2019-11-18 NOTE — Telephone Encounter (Signed)
Douglas Hart is calling stating he recently had an Echo performed at [facility]. He is requesting Dr. Tamala Julian or a nurse reach out to the hospital requesting these results and his upcoming Echo be canceled once received. Please advise.

## 2019-11-18 NOTE — Telephone Encounter (Signed)
Medical records requested from [facility] 11/18/19 vlm

## 2019-11-21 NOTE — Telephone Encounter (Signed)
Follow up  Pt is calling back to follow up if we already receive copy of his echo form [facility] so he can cancel his upcoming echo on 11/27/19

## 2019-11-21 NOTE — Telephone Encounter (Signed)
I spoke to the patient and informed him that we did receive the Echo report from Douglas Hart and it is in Douglas Hart box to review and that we will call him to verify cancellation of upcoming Echo on 3/17.    The patient wanted to make Douglas Hart aware that he was in the ED last Thursday 3/4 with SOB and was diagnosed with Pneumonia in the R Lung.

## 2019-11-22 NOTE — Telephone Encounter (Signed)
I have not seen the echo report.  As long as we have that I can probably review it before his office visit.  Is it being scanned in?

## 2019-11-22 NOTE — Telephone Encounter (Signed)
Spoke with pt and made him aware that I have cancelled his echo at our office since he had one at [facility].  Pt is seeing PCP today to f/u from recent hospital stay for PNA.  Scheduled pt to see Dr. Tamala Julian on 3/29 for his regular f/u.  Advised I will call back with any recommendations Dr. Tamala Julian has once he reviews pt's echo.

## 2019-11-27 ENCOUNTER — Other Ambulatory Visit (HOSPITAL_COMMUNITY): Payer: BC Managed Care – PPO

## 2019-12-07 NOTE — Progress Notes (Signed)
Cardiology Admission History and Physical   Date:  12/09/2019   ID:  Douglas Hart, DOB July 31, 1946, MRN [id number]  PCP:  Street, Sharon Mt, MD  Cardiologist:  [name], MD   Referring MD: Street, Sharon Mt, *   Chief Complaint  Patient presents with  . Congestive Heart Failure  . Cardiac Valve Problem    Bioprosthetic Aortic Valve Reg    History of Present Illness:    Douglas Hart is a 74 y.o. male with a hx of bioprosthetic aortic valve replacement in 2008 by Dr. [name], post-op atrial flutter, hypertension, and  hyperlipidemia.  On February 24, 1 day before getting his first and only [place] COVID-19 vaccine, the patient noted decreased urinary output, shortness of breath with physical activity, and lower extremity edema.  This was the first time that he had exercised during the COVID-19 pandemic.  His only activities up to this point for approximately 1 year had been working in his yard.  On February 26, he noticed a full feeling in his chest, abdomen, right arm, lower back to neck.[name]  He also began noticing increasing shortness of breath and on November 14, 2019 was admitted to the hospital in [facility] at 2 AM because of respiratory distress..  Initially told he had pneumonia in his right lung.  1 L of fluid was drained from the left lung.  On March 5 10 ounces of fluid was drained from the right lung.  He was discharged from the hospital on November 17, 2019, but has remained short of breath with minimal endurance.  His energy level is low.  Is been hard to sleep because of shortness of breath.  He was treated with diuretics and antibiotics.  He was followed up by primary care.  Since being discharged he has gotten progressively more short of breath although not quite as bad as March 4 when he was admitted.  He is unable to sleep in his bed.  He has not been able to get much sleep since 27 March.  No data from [facility] relative to the discharge summary or  diagnosis.  Echocardiogram was done at [facility] but no results are known.[name]  He does not recall what they told him about his prosthetic valve.  His idea of what was treated in the hospital was "pneumonia".  He was negative for Covid twice while in [facility].  He denies chills and fever.  Unsure if blood cultures were done to rule out endocarditis.  He was placed on antibiotics but states that he was only on oral antibiotics for 2 days after discharge.  He feels that he received antibiotics while in the hospital from March 4 through March 7.  Past Medical History:  Diagnosis Date  . Asthma   . Fatigue   . Hearing loss   . Hypertension   . Knee pain   . Muscle pain     Past Surgical History:  Procedure Laterality Date  . CARDIAC SURGERY      Current Medications: Current Meds  Medication Sig  . amLODipine (NORVASC) 5 MG tablet Take 1 tablet (5 mg total) by mouth daily.  [name] amoxicillin (AMOXIL) 500 MG capsule Take 2,000 mg by mouth as directed. Take 4 capsules 1 hour prior to dental appointments  . furosemide (LASIX) 40 MG tablet Take 1 tablet (40 mg total) by mouth daily.  [name] lisinopril (PRINIVIL,ZESTRIL) 40 MG tablet Take 1 tablet (40 mg total) by mouth daily.  [name]  metoprolol succinate (TOPROL-XL) 50 MG 24 hr tablet TAKE 1 TABLET BY MOUTH ONCE DAILY OR IMMEDIATELY FOLLOWING A MEAL  . potassium chloride SA (K-DUR,KLOR-CON) 20 MEQ tablet Take 1 tablet (20 mEq total) by mouth daily.     Allergies:   Patient has no known allergies.   Social History   Socioeconomic History  . Marital status: Married    Spouse name: Not on file  . Number of children: Not on file  . Years of education: Not on file  . Highest education level: Not on file  Occupational History  . Not on file  Tobacco Use  . Smoking status: Never Smoker  . Smokeless tobacco: Never Used  Substance and Sexual Activity  . Alcohol use: No  . Drug use: No  . Sexual activity: Not on file  Other Topics Concern  .  Not on file  Social History Narrative  . Not on file   Social Determinants of Health   Financial Resource Strain:   . Difficulty of Paying Living Expenses:   Food Insecurity:   . Worried About Charity fundraiser in the Last Year:   . Arboriculturist in the Last Year:   Transportation Needs:   . Film/video editor (Medical):   [name] Lack of Transportation (Non-Medical):   Physical Activity:   . Days of Exercise per Week:   . Minutes of Exercise per Session:   Stress:   . Feeling of Stress :   Social Connections:   . Frequency of Communication with Friends and Family:   . Frequency of Social Gatherings with Friends and Family:   . Attends Religious Services:   . Active Member of Clubs or Organizations:   . Attends Archivist Meetings:   [name] Marital Status:      Family History: The patient's family history includes Alzheimer's disease in his mother; Heart disease in his brother; Heart disease (age of onset: 19) in his father; Hypertension in his brother and father; Leukemia (age of onset: 2) in his brother; Other in his daughter, daughter, and sister. There is no history of Heart attack or Stroke.  ROS:   Please see the history of present illness.    Decreasing appetite.  1 dose of [place] COVID-19 vaccination.  Refused to get the second dose because he feels it caused him to develop shortness of breath and admission to the hospital.  Within 24 hours of getting the but during the shot he complained of right breast and liver area tightness and also had pain in his back that went from the lower back up to his neck area on the right side of his spine.  That resolved within hours.  All other systems reviewed and are negative.    EKGs/Labs/Other Studies Reviewed:    The following studies were reviewed today: No recent cardiac imaging data other than that performed at [facility].  We will try to get copies of that information.  EKG:  EKG normal sinus rhythm, heart rate  84 bpm, vertical axis, prominent voltage potentially compatible with hypertrophy.  Left atrial abnormality.  No significant changes noted compared to January 2019 tracing.  Recent Labs: No results found for requested labs within last 8760 hours.  Recent Lipid Panel No results found for: CHOL, TRIG, HDL, CHOLHDL, VLDL, LDLCALC, LDLDIRECT  Physical Exam:    VS:  BP 140/72   Pulse 84   Ht 6\' 1"  (1.854 m)   Wt 247 lb 1.9 oz (112.1  kg)   SpO2 95%   BMI 32.60 kg/m     Wt Readings from Last 3 Encounters:  12/09/19 247 lb 1.9 oz (112.1 kg)  04/24/19 245 lb (111.1 kg)  01/22/19 241 lb (109.3 kg)     GEN: Moderate obesity.. No acute distress HEENT: Normal NECK: Bilateral JVD to the angle of the jaw with the patient sitting at 60 degrees.[name] LYMPHATICS: No lymphadenopathy CARDIAC: 3/6 crescendo decrescendo right upper sternal systolic murmur of stenosis and 3-4 over 6 holodiastolic high-pitched aortic regurgitation murmur.  RRR with S4 gallop, and bilateral lower extremity 2+ edema. VASCULAR: Difficult to feel bilateral pedal pulses. No bruits. RESPIRATORY: Diminished breath sounds both bases, left greater than right.  Mid lung field crackles are noted.   ABDOMEN: Soft, non-tender, non-distended, No pulsatile mass, MUSCULOSKELETAL: No deformity  SKIN: Warm and dry NEUROLOGIC:  Alert and oriented x 3 PSYCHIATRIC:  Normal affect   ASSESSMENT:    1. Acute on chronic combined systolic and diastolic CHF (congestive heart failure) ([place])   2. H/O aortic valve replacement   3. Essential hypertension   4. Mixed hyperlipidemia   5. Educated about COVID-19 virus infection   6. History of recent pneumonia    PLAN:    In order of problems listed above:  1. The patient has clinical evidence of acute on chronic presumed combined systolic and diastolic heart failure although it could be purely diastolic as last EF in [id number] was normal.  The patient has orthopnea, PND, and bilateral lower extremity  swelling.  Recent hospital stay at [facility] for "pneumonia" but now being treated with furosemide 40 mg twice daily.  Unable to lie flat and had difficulty walking across the parking lot. 2. 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  Rule out endocarditis.  May need transesophageal echo.  Will repeat transthoracic echo.  Will need to get information from [facility]. 3. Current blood pressure is adequate given age and clinical situation.   4. No recent lipid data 5. Covid testing negative x2 while hospitalized at [facility] on March 4 through March 7.  He has received 1 shot of the [place] vaccine on November 07, 2019. 6. Whether or not the patient had pneumonia is in need of definition.   This is a worrisome situation in a patient with a bioprosthetic aortic valve in 2008 with clear evidence of valve dysfunction based upon exam and also evidence of heart failure on clinical exam.   Plan to admit to the hospital for IV diuresis.  Get records from [facility].  Will need right and left heart cath with coronary angio, scheduled for Thursday, April 2 with Dr. Illene Labrador, III.  Repeat transthoracic echo.  May need transesophageal echo.  Will need blood cultures performed to rule out endocarditis (received antibiotics for "pneumonia")  Previous aortic valve surgery was performed by Dr. [name].  Depending upon findings on echo, consideration for current valve problem could be valve in valve TAVR or repeat aortic valve replacement.  Will depend upon the predominant valve lesion currently.   Medication Adjustments/Labs and Tests Ordered: Current medicines are reviewed at length with the patient today.  Concerns regarding medicines are outlined above.  Orders Placed This Encounter  Procedures  . EKG 12-Lead   No orders of the defined types were  placed in this encounter.   Patient Instructions  Medication Instructions:  Your physician recommends  that you continue on your current medications as directed. Please refer to the Current Medication list given to you today.  *If you need a refill on your cardiac medications before your next appointment, please call your pharmacy*   Lab Work: None If you have labs (blood work) drawn today and your tests are completely normal, you will receive your results only by: [name] MyChart Message (if you have MyChart) OR . A paper copy in the mail If you have any lab test that is abnormal or we need to change your treatment, we will call you to review the results.   Testing/Procedures: Your physician has requested that you have an echocardiogram. Echocardiography is a painless test that uses sound waves to create images of your heart. It provides your doctor with information about the size and shape of your heart and how well your heart's chambers and valves are working. This procedure takes approximately one hour. There are no restrictions for this procedure.  Your physician has requested that you have a cardiac catheterization. Cardiac catheterization is used to diagnose and/or treat various heart conditions. Doctors may recommend this procedure for a number of different reasons. The most common reason is to evaluate chest pain. Chest pain can be a symptom of coronary artery disease (CAD), and cardiac catheterization can show whether plaque is narrowing or blocking your heart's arteries. This procedure is also used to evaluate the valves, as well as measure the blood flow and oxygen levels in different parts of your heart. For further information please visit [email or web address]. Please follow instruction sheet, as given.     Follow-Up: At [facility], you and your health needs are our priority.  As part of our continuing mission to provide you with exceptional heart care, we have created designated  Provider Care Teams.  These Care Teams include your primary Cardiologist (physician) and Advanced Practice Providers (APPs -  Physician Assistants and Nurse Practitioners) who all work together to provide you with the care you need, when you need it.  We recommend signing up for the patient portal called "MyChart".  Sign up information is provided on this After Visit Summary.  MyChart is used to connect with patients for Virtual Visits (Telemedicine).  Patients are able to view lab/test results, encounter notes, upcoming appointments, etc.  Non-urgent messages can be sent to your provider as well.   To learn more about what you can do with MyChart, go to [email or web address].    Your next appointment:   2-3 week(s)  The format for your next appointment:   In Person  Provider:   You may see [name], MD or one of the following Advanced Practice Providers on your designated Care Team:    Truitt Merle, NP  Cecilie Kicks, NP  Kathyrn Drown, NP    Other Instructions You are being admitted to [place] at [facility]. When a bed is available they will contact you to come in. Any changes in symptoms or severe Chest Pain, report to the ER.      [place] OFFICE [place], [place] [address] Dept: [phone] Loc: [place]  12/09/2019  You are scheduled for a Cardiac Catheterization on Thursday, April 1 with Dr. Daneen Schick.  1. You will already be admitted to the hospital. Only 1 Family member is allowed to be with you.   Special note: Every effort is made to have your  procedure done on time. Please understand that emergencies sometimes delay scheduled procedures.  2. Diet: Do not eat solid foods after midnight.  The patient may have clear liquids until 5am upon the day of the procedure.  3. Labs: These will be done in the hospital.   4. Medication  instructions in preparation for your procedure:   Contrast Allergy: No  Make sure you do NOT take your Furosemide the morning of your procedure.   On the morning of your procedure, take your Aspirin and any morning medicines NOT listed above.  You may use sips of water.  5. Plan for one night stay--bring personal belongings. 6. Bring a current list of your medications and current insurance cards. 7. You MUST have a responsible person to drive you home. 8. Someone MUST be with you the first 24 hours after you arrive home or your discharge will be delayed. 9. Please wear clothes that are easy to get on and off and wear slip-on shoes.  Thank you for allowing [place] to care for you!   -- [place] [facility]      Signed, [name], MD  12/09/2019 1:03 PM    [place]

## 2019-12-09 ENCOUNTER — Inpatient Hospital Stay (HOSPITAL_COMMUNITY)
Admission: AD | Admit: 2019-12-09 | Discharge: 2019-12-18 | DRG: 266 | Disposition: A | Discharge status: Home / Self Care | Payer: BC Managed Care – PPO | Attending: Cardiovascular Disease | Admitting: Cardiovascular Disease

## 2019-12-09 ENCOUNTER — Other Ambulatory Visit: Payer: Self-pay

## 2019-12-09 ENCOUNTER — Ambulatory Visit (INDEPENDENT_AMBULATORY_CARE_PROVIDER_SITE_OTHER): Payer: BC Managed Care – PPO | Admitting: Interventional Cardiology

## 2019-12-09 ENCOUNTER — Encounter: Payer: Self-pay | Admitting: Interventional Cardiology

## 2019-12-09 ENCOUNTER — Telehealth: Payer: Self-pay | Admitting: Interventional Cardiology

## 2019-12-09 VITALS — BP 140/72 | HR 84 | Ht 73.0 in | Wt 247.1 lb

## 2019-12-09 DIAGNOSIS — I5033 Acute on chronic diastolic (congestive) heart failure: Secondary | ICD-10-CM

## 2019-12-09 DIAGNOSIS — T82897S Other specified complication of cardiac prosthetic devices, implants and grafts, sequela: Secondary | ICD-10-CM | POA: Diagnosis not present

## 2019-12-09 DIAGNOSIS — I509 Heart failure, unspecified: Secondary | ICD-10-CM

## 2019-12-09 DIAGNOSIS — H9190 Unspecified hearing loss, unspecified ear: Secondary | ICD-10-CM | POA: Diagnosis present

## 2019-12-09 DIAGNOSIS — G4733 Obstructive sleep apnea (adult) (pediatric): Secondary | ICD-10-CM | POA: Diagnosis present

## 2019-12-09 DIAGNOSIS — Y713 Surgical instruments, materials and cardiovascular devices (including sutures) associated with adverse incidents: Secondary | ICD-10-CM | POA: Diagnosis present

## 2019-12-09 DIAGNOSIS — T82897A Other specified complication of cardiac prosthetic devices, implants and grafts, initial encounter: Principal | ICD-10-CM | POA: Diagnosis present

## 2019-12-09 DIAGNOSIS — Z9989 Dependence on other enabling machines and devices: Secondary | ICD-10-CM | POA: Diagnosis not present

## 2019-12-09 DIAGNOSIS — E785 Hyperlipidemia, unspecified: Secondary | ICD-10-CM | POA: Diagnosis present

## 2019-12-09 DIAGNOSIS — I5043 Acute on chronic combined systolic (congestive) and diastolic (congestive) heart failure: Secondary | ICD-10-CM

## 2019-12-09 DIAGNOSIS — Z8701 Personal history of pneumonia (recurrent): Secondary | ICD-10-CM

## 2019-12-09 DIAGNOSIS — D649 Anemia, unspecified: Secondary | ICD-10-CM | POA: Diagnosis not present

## 2019-12-09 DIAGNOSIS — I352 Nonrheumatic aortic (valve) stenosis with insufficiency: Secondary | ICD-10-CM | POA: Diagnosis present

## 2019-12-09 DIAGNOSIS — E782 Mixed hyperlipidemia: Secondary | ICD-10-CM

## 2019-12-09 DIAGNOSIS — I2721 Secondary pulmonary arterial hypertension: Secondary | ICD-10-CM | POA: Diagnosis not present

## 2019-12-09 DIAGNOSIS — Z20822 Contact with and (suspected) exposure to covid-19: Secondary | ICD-10-CM | POA: Diagnosis present

## 2019-12-09 DIAGNOSIS — T82897D Other specified complication of cardiac prosthetic devices, implants and grafts, subsequent encounter: Secondary | ICD-10-CM | POA: Diagnosis not present

## 2019-12-09 DIAGNOSIS — R0902 Hypoxemia: Secondary | ICD-10-CM

## 2019-12-09 DIAGNOSIS — J45909 Unspecified asthma, uncomplicated: Secondary | ICD-10-CM | POA: Diagnosis present

## 2019-12-09 DIAGNOSIS — E876 Hypokalemia: Secondary | ICD-10-CM | POA: Diagnosis not present

## 2019-12-09 DIAGNOSIS — Y831 Surgical operation with implant of artificial internal device as the cause of abnormal reaction of the patient, or of later complication, without mention of misadventure at the time of the procedure: Secondary | ICD-10-CM | POA: Diagnosis present

## 2019-12-09 DIAGNOSIS — T8209XA Other mechanical complication of heart valve prosthesis, initial encounter: Secondary | ICD-10-CM

## 2019-12-09 DIAGNOSIS — Z8249 Family history of ischemic heart disease and other diseases of the circulatory system: Secondary | ICD-10-CM | POA: Diagnosis not present

## 2019-12-09 DIAGNOSIS — Z952 Presence of prosthetic heart valve: Secondary | ICD-10-CM

## 2019-12-09 DIAGNOSIS — I2722 Pulmonary hypertension due to left heart disease: Secondary | ICD-10-CM | POA: Diagnosis present

## 2019-12-09 DIAGNOSIS — I2510 Atherosclerotic heart disease of native coronary artery without angina pectoris: Secondary | ICD-10-CM | POA: Diagnosis not present

## 2019-12-09 DIAGNOSIS — Z7189 Other specified counseling: Secondary | ICD-10-CM | POA: Diagnosis not present

## 2019-12-09 DIAGNOSIS — I10 Essential (primary) hypertension: Secondary | ICD-10-CM

## 2019-12-09 DIAGNOSIS — Z006 Encounter for examination for normal comparison and control in clinical research program: Secondary | ICD-10-CM

## 2019-12-09 DIAGNOSIS — Z820 Family history of epilepsy and other diseases of the nervous system: Secondary | ICD-10-CM | POA: Diagnosis not present

## 2019-12-09 DIAGNOSIS — I110 Hypertensive heart disease with heart failure: Secondary | ICD-10-CM | POA: Diagnosis present

## 2019-12-09 DIAGNOSIS — Z79899 Other long term (current) drug therapy: Secondary | ICD-10-CM

## 2019-12-09 DIAGNOSIS — I272 Pulmonary hypertension, unspecified: Secondary | ICD-10-CM | POA: Diagnosis present

## 2019-12-09 DIAGNOSIS — Z01810 Encounter for preprocedural cardiovascular examination: Secondary | ICD-10-CM | POA: Diagnosis not present

## 2019-12-09 DIAGNOSIS — I350 Nonrheumatic aortic (valve) stenosis: Secondary | ICD-10-CM

## 2019-12-09 DIAGNOSIS — I5031 Acute diastolic (congestive) heart failure: Secondary | ICD-10-CM

## 2019-12-09 DIAGNOSIS — I77810 Thoracic aortic ectasia: Secondary | ICD-10-CM | POA: Diagnosis present

## 2019-12-09 DIAGNOSIS — I351 Nonrheumatic aortic (valve) insufficiency: Secondary | ICD-10-CM | POA: Diagnosis not present

## 2019-12-09 HISTORY — DX: Congenital insufficiency of aortic valve: Q23.1

## 2019-12-09 HISTORY — DX: Other mechanical complication of heart valve prosthesis, initial encounter: T82.09XA

## 2019-12-09 HISTORY — DX: Sleep apnea, unspecified: G47.30

## 2019-12-09 HISTORY — DX: Presence of prosthetic heart valve: Z95.2

## 2019-12-09 HISTORY — DX: Bicuspid aortic valve: Q23.81

## 2019-12-09 HISTORY — DX: Chronic diastolic (congestive) heart failure: I50.32

## 2019-12-09 LAB — CBC WITH DIFFERENTIAL/PLATELET
Abs Immature Granulocytes: 0.03 10*3/uL (ref 0.00–0.07)
Basophils Absolute: 0 10*3/uL (ref 0.0–0.1)
Basophils Relative: 0 %
Eosinophils Absolute: 0.1 10*3/uL (ref 0.0–0.5)
Eosinophils Relative: 2 %
HCT: 34.3 % — ABNORMAL LOW (ref 39.0–52.0)
Hemoglobin: 11.4 g/dL — ABNORMAL LOW (ref 13.0–17.0)
Immature Granulocytes: 1 %
Lymphocytes Relative: 19 %
Lymphs Abs: 1.2 10*3/uL (ref 0.7–4.0)
MCH: 31.7 pg (ref 26.0–34.0)
MCHC: 33.2 g/dL (ref 30.0–36.0)
MCV: 95.3 fL (ref 80.0–100.0)
Monocytes Absolute: 0.6 10*3/uL (ref 0.1–1.0)
Monocytes Relative: 9 %
Neutro Abs: 4.3 10*3/uL (ref 1.7–7.7)
Neutrophils Relative %: 69 %
Platelets: 134 10*3/uL — ABNORMAL LOW (ref 150–400)
RBC: 3.6 MIL/uL — ABNORMAL LOW (ref 4.22–5.81)
RDW: 14.6 % (ref 11.5–15.5)
WBC: 6.3 10*3/uL (ref 4.0–10.5)
nRBC: 0 % (ref 0.0–0.2)

## 2019-12-09 LAB — COMPREHENSIVE METABOLIC PANEL
ALT: 62 U/L — ABNORMAL HIGH (ref 0–44)
AST: 53 U/L — ABNORMAL HIGH (ref 15–41)
Albumin: 3.7 g/dL (ref 3.5–5.0)
Alkaline Phosphatase: 67 U/L (ref 38–126)
Anion gap: 11 (ref 5–15)
BUN: 19 mg/dL (ref 8–23)
CO2: 21 mmol/L — ABNORMAL LOW (ref 22–32)
Calcium: 9.4 mg/dL (ref 8.9–10.3)
Chloride: 108 mmol/L (ref 98–111)
Creatinine, Ser: 1.13 mg/dL (ref 0.61–1.24)
GFR calc Af Amer: >60 mL/min (ref >60)
GFR calc non Af Amer: >60 mL/min (ref >60)
Glucose, Bld: 124 mg/dL — ABNORMAL HIGH (ref 70–99)
Potassium: 3.8 mmol/L (ref 3.5–5.1)
Sodium: 140 mmol/L (ref 135–145)
Total Bilirubin: 1.1 mg/dL (ref 0.3–1.2)
Total Protein: 6.5 g/dL (ref 6.5–8.1)

## 2019-12-09 LAB — BRAIN NATRIURETIC PEPTIDE: B Natriuretic Peptide: 988.3 pg/mL — ABNORMAL HIGH (ref 0.0–100.0)

## 2019-12-09 LAB — MAGNESIUM: Magnesium: 2.1 mg/dL (ref 1.7–2.4)

## 2019-12-09 MED ORDER — FUROSEMIDE 10 MG/ML IJ SOLN
40.0000 mg | Freq: Two times a day (BID) | INTRAMUSCULAR | Status: DC
  Administered 2019-12-09 – 2019-12-10 (×3): 40 mg via INTRAVENOUS
  Filled 2019-12-09 (×3): qty 4

## 2019-12-09 MED ORDER — ENOXAPARIN SODIUM 40 MG/0.4ML ~~LOC~~ SOLN
40.0000 mg | SUBCUTANEOUS | Status: DC
  Administered 2019-12-09 – 2019-12-10 (×2): 40 mg via SUBCUTANEOUS
  Filled 2019-12-09 (×2): qty 0.4

## 2019-12-09 MED ORDER — POTASSIUM CHLORIDE CRYS ER 20 MEQ PO TBCR
20.0000 meq | EXTENDED_RELEASE_TABLET | Freq: Two times a day (BID) | ORAL | Status: DC
  Administered 2019-12-09 – 2019-12-10 (×3): 20 meq via ORAL
  Filled 2019-12-09 (×3): qty 1

## 2019-12-09 MED ORDER — SODIUM CHLORIDE 0.9% FLUSH: 3.0000 mL | INTRAVENOUS | Status: DC | PRN

## 2019-12-09 MED ORDER — ACETAMINOPHEN 325 MG PO TABS: 650.0000 mg | ORAL_TABLET | ORAL | Status: DC | PRN

## 2019-12-09 MED ORDER — SODIUM CHLORIDE 0.9% FLUSH
3.0000 mL | Freq: Two times a day (BID) | INTRAVENOUS | Status: DC
  Administered 2019-12-09 – 2019-12-15 (×5): 3 mL via INTRAVENOUS

## 2019-12-09 MED ORDER — ONDANSETRON HCL 4 MG/2ML IJ SOLN: 4.0000 mg | Freq: Four times a day (QID) | INTRAMUSCULAR | Status: DC | PRN

## 2019-12-09 MED ORDER — METOPROLOL SUCCINATE ER 50 MG PO TB24
50.0000 mg | ORAL_TABLET | Freq: Every day | ORAL | Status: DC
  Administered 2019-12-10 – 2019-12-16 (×7): 50 mg via ORAL
  Filled 2019-12-09 (×9): qty 1

## 2019-12-09 MED ORDER — LISINOPRIL 40 MG PO TABS
40.0000 mg | ORAL_TABLET | Freq: Every day | ORAL | Status: DC
  Administered 2019-12-10: 08:00:00 40 mg via ORAL
  Filled 2019-12-09 (×2): qty 1

## 2019-12-09 MED ORDER — SODIUM CHLORIDE 0.9 % IV SOLN: 250.0000 mL | INTRAVENOUS | Status: DC | PRN

## 2019-12-09 NOTE — H&P (Signed)
Cardiology Admission History and Physical   Date:  12/09/2019   ID:  Douglas Hart, DOB [birth year], MRN [id number]  PCP:  Street, Sharon Mt, MD  Cardiologist:  [name], MD   Referring MD: Street, Sharon Mt, *   Chief Complaint  Patient presents with  . Congestive Heart Failure  . Cardiac Valve Problem    Bioprosthetic Aortic Valve Reg    History of Present Illness:    Douglas Hart is a 74 y.o. male with a hx of bioprosthetic aortic valve replacement in 2008 by Dr. [name], post-op atrial flutter, hypertension, and  hyperlipidemia.  On February 24, 1 day before getting his first and only [place] COVID-19 vaccine, the patient noted decreased urinary output, shortness of breath with physical activity, and lower extremity edema.  This was the first time that he had exercised during the COVID-19 pandemic.  His only activities up to this point for approximately 1 year had been working in his yard.  On February 26, he noticed a full feeling in his chest, abdomen, right arm, lower back to neck.[name]  He also began noticing increasing shortness of breath and on November 14, 2019 was admitted to the hospital in [facility] at 2 AM because of respiratory distress..  Initially told he had pneumonia in his right lung.  1 L of fluid was drained from the left lung.  On March 5 10 ounces of fluid was drained from the right lung.  He was discharged from the hospital on November 17, 2019, but has remained short of breath with minimal endurance.  His energy level is low.  Is been hard to sleep because of shortness of breath.  He was treated with diuretics and antibiotics.  He was followed up by primary care.  Since being discharged he has gotten progressively more short of breath although not quite as bad as March 4 when he was admitted.  He is unable to sleep in his bed.  He has not been able to get much sleep since 27 March.  No data from [facility] relative to the discharge summary or  diagnosis.  Echocardiogram was done at [facility] but no results are known.[name]  He does not recall what they told him about his prosthetic valve.  His idea of what was treated in the hospital was "pneumonia".  He was negative for Covid twice while in [facility].  He denies chills and fever.  Unsure if blood cultures were done to rule out endocarditis.  He was placed on antibiotics but states that he was only on oral antibiotics for 2 days after discharge.  He feels that he received antibiotics while in the hospital from March 4 through March 7.  Past Medical History:  Diagnosis Date  . Asthma   . Fatigue   . Hearing loss   . Hypertension   . Knee pain   . Muscle pain     Past Surgical History:  Procedure Laterality Date  . CARDIAC SURGERY      Current Medications: Current Meds  Medication Sig  . amLODipine (NORVASC) 5 MG tablet Take 1 tablet (5 mg total) by mouth daily.  [name] amoxicillin (AMOXIL) 500 MG capsule Take 2,000 mg by mouth as directed. Take 4 capsules 1 hour prior to dental appointments  . furosemide (LASIX) 40 MG tablet Take 1 tablet (40 mg total) by mouth daily.  [name] lisinopril (PRINIVIL,ZESTRIL) 40 MG tablet Take 1 tablet (40 mg total) by mouth daily.  [name]  metoprolol succinate (TOPROL-XL) 50 MG 24 hr tablet TAKE 1 TABLET BY MOUTH ONCE DAILY OR IMMEDIATELY FOLLOWING A MEAL  . potassium chloride SA (K-DUR,KLOR-CON) 20 MEQ tablet Take 1 tablet (20 mEq total) by mouth daily.     Allergies:   Patient has no known allergies.   Social History   Socioeconomic History  . Marital status: Married    Spouse name: Not on file  . Number of children: Not on file  . Years of education: Not on file  . Highest education level: Not on file  Occupational History  . Not on file  Tobacco Use  . Smoking status: Never Smoker  . Smokeless tobacco: Never Used  Substance and Sexual Activity  . Alcohol use: No  . Drug use: No  . Sexual activity: Not on file  Other Topics Concern  .  Not on file  Social History Narrative  . Not on file   Social Determinants of Health   Financial Resource Strain:   . Difficulty of Paying Living Expenses:   Food Insecurity:   . Worried About Charity fundraiser in the Last Year:   . Arboriculturist in the Last Year:   Transportation Needs:   . Film/video editor (Medical):   [name] Lack of Transportation (Non-Medical):   Physical Activity:   . Days of Exercise per Week:   . Minutes of Exercise per Session:   Stress:   . Feeling of Stress :   Social Connections:   . Frequency of Communication with Friends and Family:   . Frequency of Social Gatherings with Friends and Family:   . Attends Religious Services:   . Active Member of Clubs or Organizations:   . Attends Archivist Meetings:   [name] Marital Status:      Family History: The patient's family history includes Alzheimer's disease in his mother; Heart disease in his brother; Heart disease (age of onset: 58) in his father; Hypertension in his brother and father; Leukemia (age of onset: 42) in his brother; Other in his daughter, daughter, and sister. There is no history of Heart attack or Stroke.  ROS:   Please see the history of present illness.    Decreasing appetite.  1 dose of [place]ater[place] COVID-19 vaccination.  Refused to get the second dose because he feels it caused him to develop shortness of breath and admission to the hospital.  Within 24 hours of getting the but during the shot he complained of right breast and liver area tightness and also had pain in his back that went from the lower back up to his neck area on the right side of his spine.  That resolved within hours.  All other systems reviewed and are negative.    EKGs/Labs/Other Studies Reviewed:    The following studies were reviewed today: No recent cardiac imaging data other than that performed at [facility].  We will try to get copies of that information.  EKG:  EKG normal sinus rhythm, heart rate  84 bpm, vertical axis, prominent voltage potentially compatible with hypertrophy.  Left atrial abnormality.  No significant changes noted compared to January 2019 tracing.  Recent Labs: No results found for requested labs within last 8760 hours.  Recent Lipid Panel No results found for: CHOL, TRIG, HDL, CHOLHDL, VLDL, LDLCALC, LDLDIRECT  Physical Exam:    VS:  BP 140/72   Pulse 84   Ht 6\' 1"  (1.854 m)   Wt 247 lb 1.9 oz (112.1  kg)   SpO2 95%   BMI 32.60 kg/m     Wt Readings from Last 3 Encounters:  12/09/19 247 lb 1.9 oz (112.1 kg)  04/24/19 245 lb (111.1 kg)  01/22/19 241 lb (109.3 kg)     GEN: Moderate obesity.. No acute distress HEENT: Normal NECK: Bilateral JVD to the angle of the jaw with the patient sitting at 60 degrees.[name] LYMPHATICS: No lymphadenopathy CARDIAC: 3/6 crescendo decrescendo right upper sternal systolic murmur of stenosis and 3-4 over 6 holodiastolic high-pitched aortic regurgitation murmur.  RRR with S4 gallop, and bilateral lower extremity 2+ edema. VASCULAR: Difficult to feel bilateral pedal pulses. No bruits. RESPIRATORY: Diminished breath sounds both bases, left greater than right.  Mid lung field crackles are noted.   ABDOMEN: Soft, non-tender, non-distended, No pulsatile mass, MUSCULOSKELETAL: No deformity  SKIN: Warm and dry NEUROLOGIC:  Alert and oriented x 3 PSYCHIATRIC:  Normal affect   ASSESSMENT:    1. Acute on chronic combined systolic and diastolic CHF (congestive heart failure) ([place])   2. H/O aortic valve replacement   3. Essential hypertension   4. Mixed hyperlipidemia   5. Educated about COVID-19 virus infection   6. History of recent pneumonia    PLAN:    In order of problems listed above:  1. The patient has clinical evidence of acute on chronic presumed combined systolic and diastolic heart failure although it could be purely diastolic as last EF in [id number] was normal.  The patient has orthopnea, PND, and bilateral lower extremity  swelling.  Recent hospital stay at [facility] for "pneumonia" but now being treated with furosemide 40 mg twice daily.  Unable to lie flat and had difficulty walking across the parking lot. 2. 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  Rule out endocarditis.  May need transesophageal echo.  Will repeat transthoracic echo.  Will need to get information from [facility]. 3. Current blood pressure is adequate given age and clinical situation.   4. No recent lipid data 5. Covid testing negative x2 while hospitalized at [facility] on March 4 through March 7.  He has received 1 shot of the [place] vaccine on November 07, 2019. 6. Whether or not the patient had pneumonia is in need of definition.   This is a worrisome situation in a patient with a bioprosthetic aortic valve in 2008 with clear evidence of valve dysfunction based upon exam and also evidence of heart failure on clinical exam.   Plan to admit to the hospital for IV diuresis.  Get records from [facility].  Will need right and left heart cath with coronary angio, scheduled for Thursday, April 2 with Dr. Illene Labrador, III.  Repeat transthoracic echo.  May need transesophageal echo.  Will need blood cultures performed to rule out endocarditis (received antibiotics for "pneumonia")  Previous aortic valve surgery was performed by Dr. [name].  Depending upon findings on echo, consideration for current valve problem could be valve in valve TAVR or repeat aortic valve replacement.  Will depend upon the predominant valve lesion currently.       Severity of Illness: The appropriate patient status for this patient is INPATIENT. Inpatient status is judged to be reasonable and necessary in order to provide the required intensity of service to ensure the patient's safety. The patient's presenting  symptoms, physical exam findings, and initial radiographic and laboratory data in the context of their  chronic comorbidities is felt to place them at high risk for further clinical deterioration. Furthermore, it is not anticipated that the patient will be medically stable for discharge from the hospital within 2 midnights of admission. The following factors support the patient status of inpatient.   " The patient's presenting symptoms include orthopnea and dyspnea. " The worrisome physical exam findings include diastolic murmur suggestive of relatively acute aortic regurgitation. " The initial radiographic and laboratory data are worrisome because of no laboratory data. " The chronic co-morbidities include heart failure and prosthetic valve dysfunction.   * I certify that at the point of admission it is my clinical judgment that the patient will require inpatient hospital care spanning beyond 2 midnights from the point of admission due to high intensity of service, high risk for further deterioration and high frequency of surveillance required.*    For questions or updates, please contact [place] Please consult [email or web address] for contact info under        Signed, [name], MD  12/09/2019 1:47 PM

## 2019-12-09 NOTE — Patient Instructions (Signed)
Medication Instructions:  Your physician recommends that you continue on your current medications as directed. Please refer to the Current Medication list given to you today.  *If you need a refill on your cardiac medications before your next appointment, please call your pharmacy*   Lab Work: None If you have labs (blood work) drawn today and your tests are completely normal, you will receive your results only by: [name] MyChart Message (if you have MyChart) OR . A paper copy in the mail If you have any lab test that is abnormal or we need to change your treatment, we will call you to review the results.   Testing/Procedures: Your physician has requested that you have an echocardiogram. Echocardiography is a painless test that uses sound waves to create images of your heart. It provides your doctor with information about the size and shape of your heart and how well your heart's chambers and valves are working. This procedure takes approximately one hour. There are no restrictions for this procedure.  Your physician has requested that you have a cardiac catheterization. Cardiac catheterization is used to diagnose and/or treat various heart conditions. Doctors may recommend this procedure for a number of different reasons. The most common reason is to evaluate chest pain. Chest pain can be a symptom of coronary artery disease (CAD), and cardiac catheterization can show whether plaque is narrowing or blocking your heart's arteries. This procedure is also used to evaluate the valves, as well as measure the blood flow and oxygen levels in different parts of your heart. For further information please visit [email or web address]. Please follow instruction sheet, as given.     Follow-Up: At [facility], you and your health needs are our priority.  As part of our continuing mission to provide you with exceptional heart care, we have created designated Provider Care Teams.  These Care Teams include your  primary Cardiologist (physician) and Advanced Practice Providers (APPs -  Physician Assistants and Nurse Practitioners) who all work together to provide you with the care you need, when you need it.  We recommend signing up for the patient portal called "MyChart".  Sign up information is provided on this After Visit Summary.  MyChart is used to connect with patients for Virtual Visits (Telemedicine).  Patients are able to view lab/test results, encounter notes, upcoming appointments, etc.  Non-urgent messages can be sent to your provider as well.   To learn more about what you can do with MyChart, go to [email or web address].    Your next appointment:   2-3 week(s)  The format for your next appointment:   In Person  Provider:   You may see [name], MD or one of the following Advanced Practice Providers on your designated Care Team:    Truitt Merle, NP  Cecilie Kicks, NP  Kathyrn Drown, NP    Other Instructions You are being admitted to [place] at [facility]. When a bed is available they will contact you to come in. Any changes in symptoms or severe Chest Pain, report to the ER.      [place] OFFICE [place], [place] [address] Dept: [phone] Loc: [place]  12/09/2019  You are scheduled for a Cardiac Catheterization on Thursday, [place] 1 with Dr. Daneen Schick.  1. You will already be admitted to the hospital. Only 1 Family member is allowed to be with you.   Special note: Every  effort is made to have your procedure done on time. Please understand that emergencies sometimes delay scheduled procedures.  2. Diet: Do not eat solid foods after midnight.  The patient may have clear liquids until 5am upon the day of the procedure.  3. Labs: These will be done in the hospital.   4. Medication instructions in preparation for your procedure:   Contrast  Allergy: No  Make sure you do NOT take your Furosemide the morning of your procedure.   On the morning of your procedure, take your Aspirin and any morning medicines NOT listed above.  You may use sips of water.  5. Plan for one night stay--bring personal belongings. 6. Bring a current list of your medications and current insurance cards. 7. You MUST have a responsible person to drive you home. 8. Someone MUST be with you the first 24 hours after you arrive home or your discharge will be delayed. 9. Please wear clothes that are easy to get on and off and wear slip-on shoes.  Thank you for allowing [place] to care for you!   -- [facility]

## 2019-12-10 ENCOUNTER — Inpatient Hospital Stay (HOSPITAL_COMMUNITY): Payer: BC Managed Care – PPO

## 2019-12-10 ENCOUNTER — Encounter (HOSPITAL_COMMUNITY): Payer: Self-pay | Admitting: Interventional Cardiology

## 2019-12-10 DIAGNOSIS — I351 Nonrheumatic aortic (valve) insufficiency: Secondary | ICD-10-CM

## 2019-12-10 DIAGNOSIS — I5043 Acute on chronic combined systolic (congestive) and diastolic (congestive) heart failure: Secondary | ICD-10-CM

## 2019-12-10 DIAGNOSIS — D649 Anemia, unspecified: Secondary | ICD-10-CM

## 2019-12-10 DIAGNOSIS — I350 Nonrheumatic aortic (valve) stenosis: Secondary | ICD-10-CM

## 2019-12-10 DIAGNOSIS — T82897S Other specified complication of cardiac prosthetic devices, implants and grafts, sequela: Secondary | ICD-10-CM

## 2019-12-10 LAB — SARS CORONAVIRUS 2 (TAT 6-24 HRS): SARS Coronavirus 2: NEGATIVE

## 2019-12-10 LAB — CBC
HCT: 33 % — ABNORMAL LOW (ref 39.0–52.0)
Hemoglobin: 11 g/dL — ABNORMAL LOW (ref 13.0–17.0)
MCH: 32.1 pg (ref 26.0–34.0)
MCHC: 33.3 g/dL (ref 30.0–36.0)
MCV: 96.2 fL (ref 80.0–100.0)
Platelets: 118 10*3/uL — ABNORMAL LOW (ref 150–400)
RBC: 3.43 MIL/uL — ABNORMAL LOW (ref 4.22–5.81)
RDW: 14.6 % (ref 11.5–15.5)
WBC: 5.6 10*3/uL (ref 4.0–10.5)
nRBC: 0 % (ref 0.0–0.2)

## 2019-12-10 LAB — BASIC METABOLIC PANEL
Anion gap: 10 (ref 5–15)
BUN: 19 mg/dL (ref 8–23)
CO2: 24 mmol/L (ref 22–32)
Calcium: 9.2 mg/dL (ref 8.9–10.3)
Chloride: 108 mmol/L (ref 98–111)
Creatinine, Ser: 1.24 mg/dL (ref 0.61–1.24)
GFR calc Af Amer: >60 mL/min (ref >60)
GFR calc non Af Amer: 57 mL/min — ABNORMAL LOW (ref >60)
Glucose, Bld: 100 mg/dL — ABNORMAL HIGH (ref 70–99)
Potassium: 3.5 mmol/L (ref 3.5–5.1)
Sodium: 142 mmol/L (ref 135–145)

## 2019-12-10 LAB — MAGNESIUM: Magnesium: 2.3 mg/dL (ref 1.7–2.4)

## 2019-12-10 LAB — ECHOCARDIOGRAM COMPLETE
Height: 73 in
Weight: 3873.6 oz

## 2019-12-10 IMAGING — DX DG CHEST 2V
2 series · 2 of 2 positions shown · non-contrast
Comparison: [DATE]

CLINICAL DATA: Chest pain and shortness of breath with fluid
retention

EXAM:
CHEST - 2 VIEW

[chest pa]
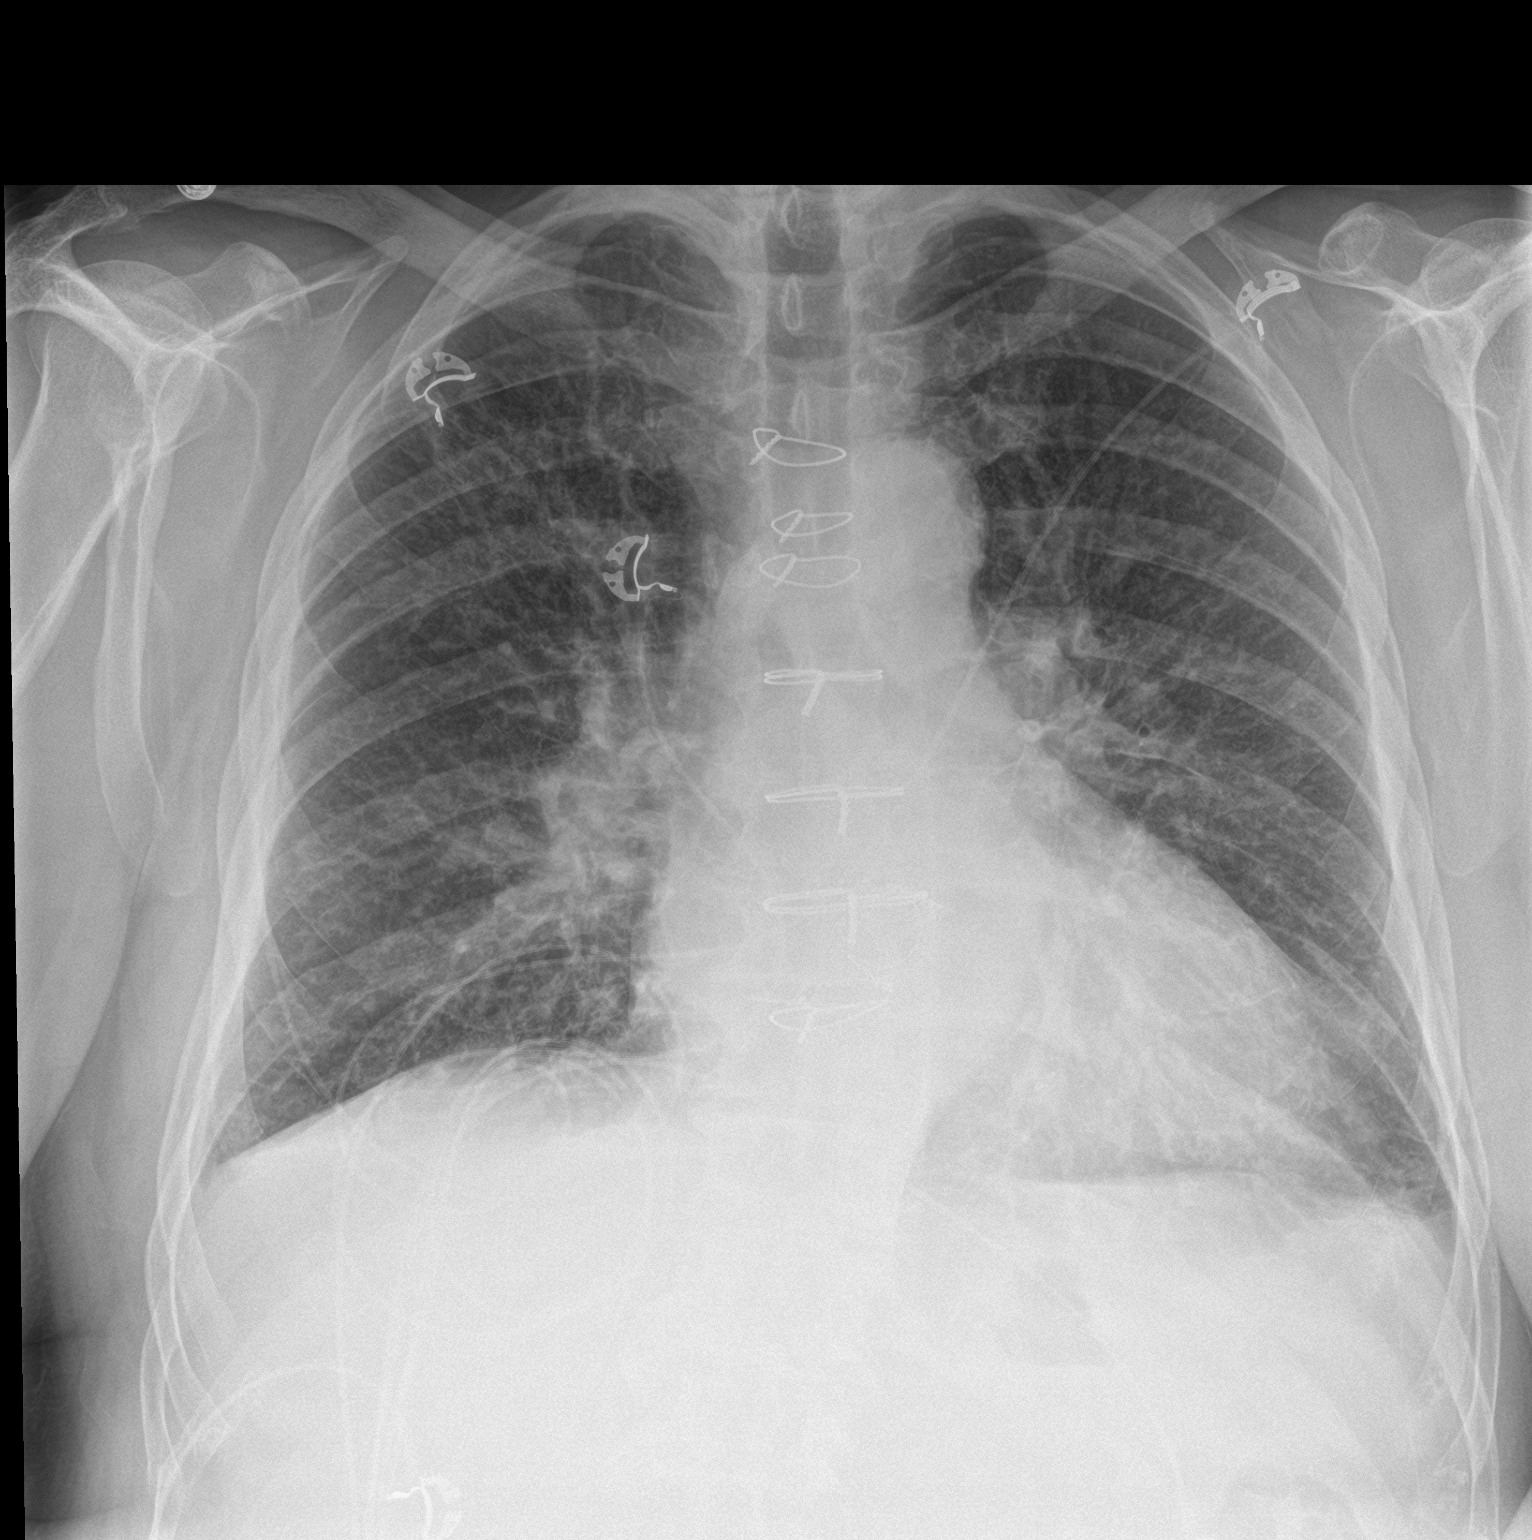

[chest lat]
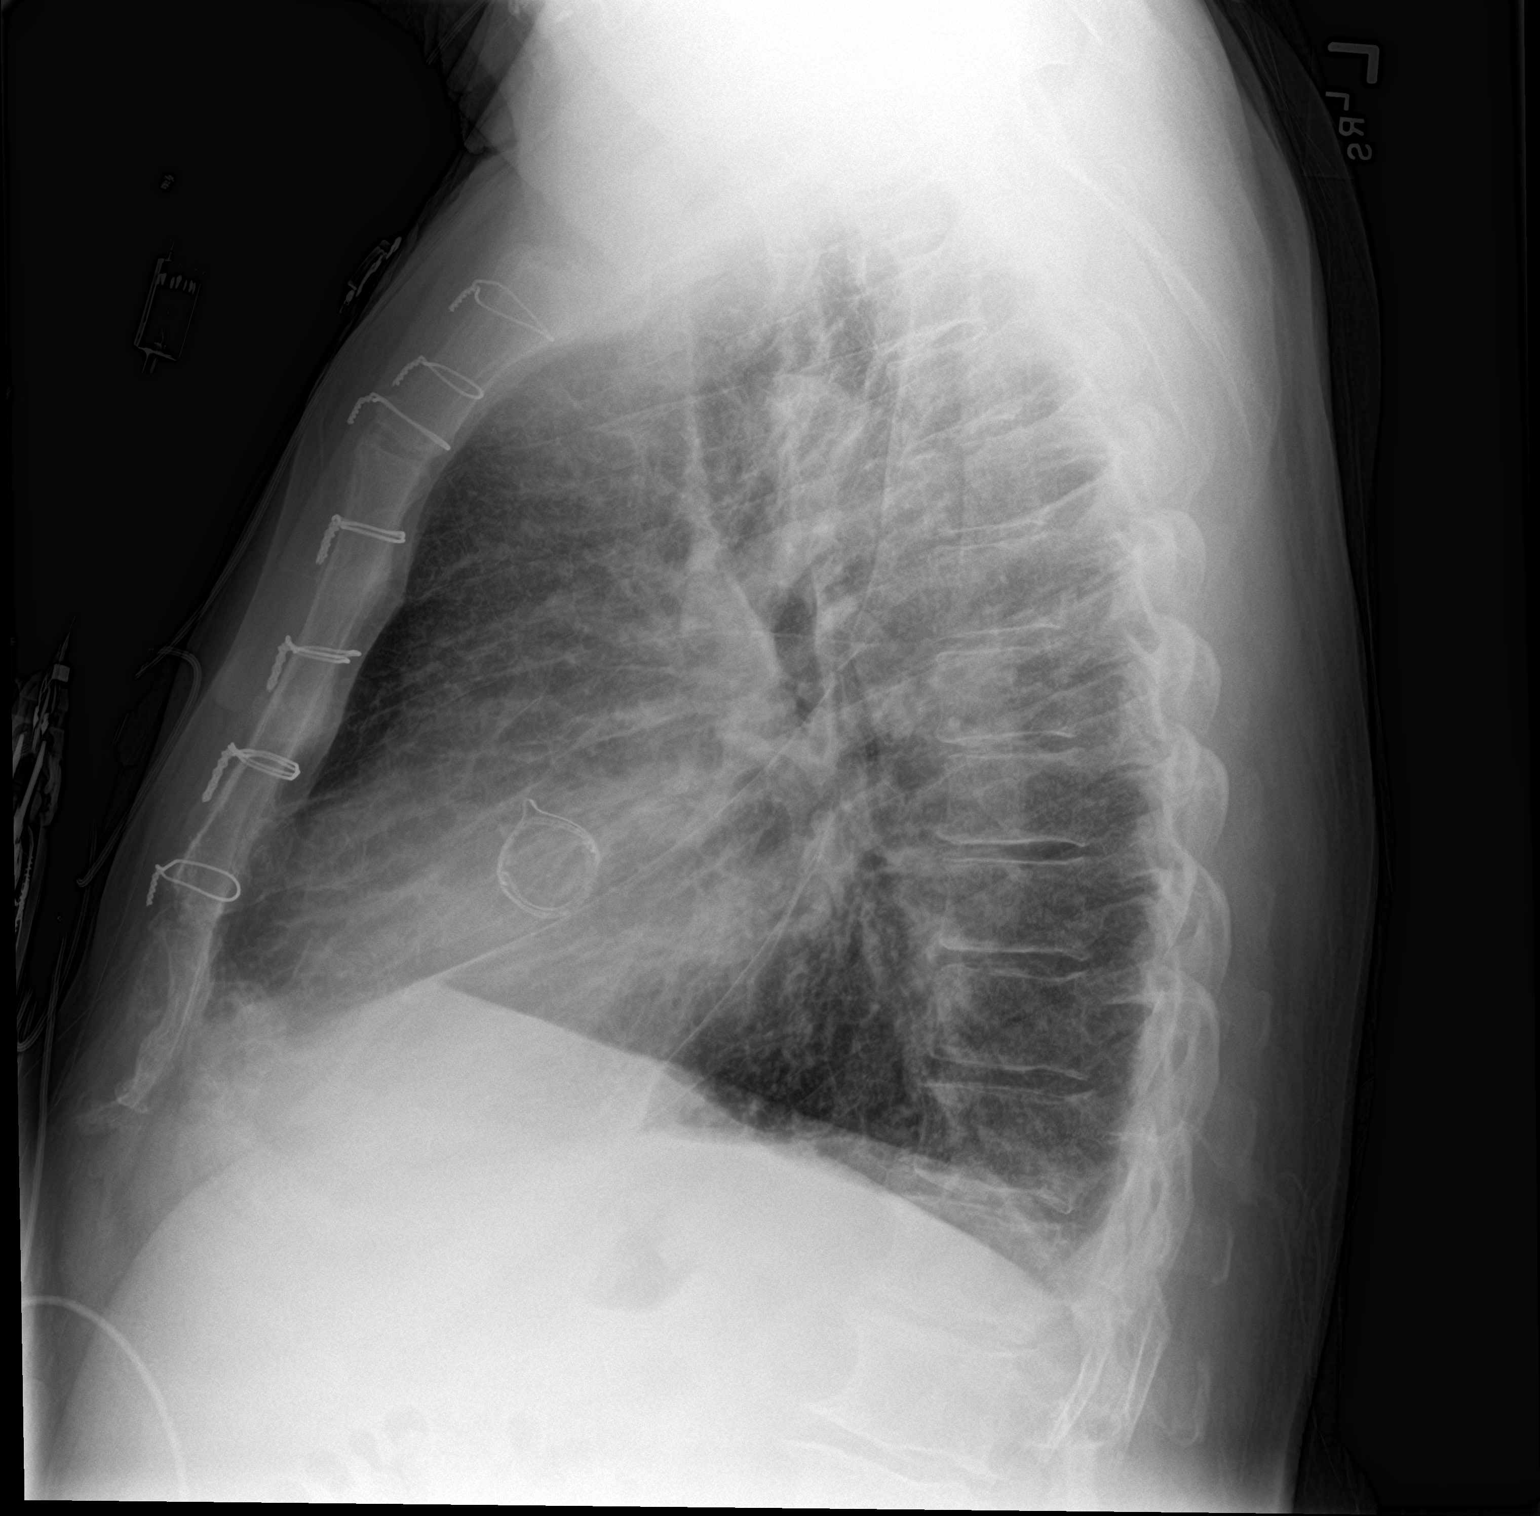

[2 of 2 positions shown; findings below may reference images not displayed]

FINDINGS: Stable borderline cardiomegaly with symmetric interstitial opacity
and mild fissure thickening. A few Kerley lines are present. Aortic
valve replacement. Trace pleural fluid.
IMPRESSION: Mild CHF.

## 2019-12-10 MED ORDER — POTASSIUM CHLORIDE CRYS ER 20 MEQ PO TBCR
60.0000 meq | EXTENDED_RELEASE_TABLET | Freq: Once | ORAL | Status: AC
  Administered 2019-12-10: 08:00:00 60 meq via ORAL
  Filled 2019-12-10: qty 3

## 2019-12-10 MED ORDER — LIVING BETTER WITH HEART FAILURE BOOK: Freq: Once | Status: DC

## 2019-12-10 NOTE — H&P (Signed)
Already done

## 2019-12-10 NOTE — Progress Notes (Signed)
ReDS Clip Diuretic Study Pt study # [id number]  Your patient is in the Blinded arm of the ReDS Clip Diuretic study.  Your patient has had a ReDS reading and the reading has been transmitted to the cloud.   Thank You   The research team   Vertis Kelch, PharmD, [facility] PGY2 Cardiology Pharmacy Resident Phone [phone] 12/10/2019       8:02 AM  Please check [email or web address] for unit-specific pharmacist phone numbers

## 2019-12-10 NOTE — Progress Notes (Addendum)
Progress Note  Patient Name: Douglas Hart Date of Encounter: 12/10/2019  Primary Cardiologist: [name], MD   Subjective   Breathing and edema improved. No chest pain.   Inpatient Medications    Scheduled Meds: . enoxaparin (LOVENOX) injection  40 mg Subcutaneous Q24H  . furosemide  40 mg Intravenous Q12H  . lisinopril  40 mg Oral Daily  . Living Better with Heart Failure Book   Does not apply Once  . metoprolol succinate  50 mg Oral Daily  . potassium chloride  20 mEq Oral BID  . sodium chloride flush  3 mL Intravenous Q12H   Continuous Infusions: . sodium chloride     PRN Meds: sodium chloride, acetaminophen, ondansetron (ZOFRAN) IV, sodium chloride flush   Vital Signs    Vitals:   12/09/19 2120 12/10/19 0030 12/10/19 0613  BP: (!) 128/57 127/66 140/68  Pulse: 85 72 72  Resp: 18 18 18   Temp: 98.3 F (36.8 C)  98.2 F (36.8 C)  TempSrc: Oral  Oral  SpO2: 95% 96%   Weight:   109.8 kg    Intake/Output Summary (Last 24 hours) at 12/10/2019 0748 Last data filed at 12/09/2019 2223 Gross per 24 hour  Intake 10 ml  Output --  Net 10 ml   Last 3 Weights 12/10/2019 12/09/2019 04/24/2019  Weight (lbs) 242 lb 1.6 oz 247 lb 1.9 oz 245 lb  Weight (kg) 109.816 kg 112.093 kg 111.131 kg      Telemetry    NSR - Personally Reviewed  ECG    No new tracing   Physical Exam   GEN: No acute distress.   Neck: No JVD Cardiac: +,Load systolic and diastolic murmurs, rubs, or gallops.  Respiratory: Clear to auscultation bilaterally. GI: Soft, nontender, non-distended  MS: 2+ BL LE edema; No deformity. Neuro:  Nonfocal  Psych: Normal affect   Labs    Chemistry Recent Labs  Lab 12/09/19 2053 12/10/19 0435  NA 140 142  K 3.8 3.5  CL 108 108  CO2 21* 24  GLUCOSE 124* 100*  BUN 19 19  CREATININE 1.13 1.24  CALCIUM 9.4 9.2  PROT 6.5  --   ALBUMIN 3.7  --   AST 53*  --   ALT 62*  --   ALKPHOS 67  --   BILITOT 1.1  --   GFRNONAA >60 57*  GFRAA >60  >60  ANIONGAP 11 10     Hematology Recent Labs  Lab 12/09/19 2053 12/10/19 0435  WBC 6.3 5.6  RBC 3.60* 3.43*  HGB 11.4* 11.0*  HCT 34.3* 33.0*  MCV 95.3 96.2  MCH 31.7 32.1  MCHC 33.2 33.3  RDW 14.6 14.6  PLT 134* 118*    BNP Recent Labs  Lab 12/09/19 2053  BNP 988.3*    Lipid Panel  No results found for: CHOL, TRIG, HDL, CHOLHDL, VLDL, LDLCALC, LDLDIRECT, LABVLDL     Radiology    No results found.  Cardiac Studies   Pending echo   Patient Profile     74 y.o. male with a hx of bioprosthetic aortic valve replacement in 2008 by Dr. [name] atrial flutter,hypertension, hyperlipidemia and recent admission to [facility] 11/14/19 with pneumonia who directly admitted from clinic for acute on chronic combined CHF.   Assessment & Plan    1. Acute on chronic combined CHF - BNP 988. N & O not recorded. Says used bathroom multiple times.  Strict  I & O and daily weight. Continue  IV diureses.  - Continue BB and ACE  2. S/p bioprosthetic aortic valve replacement - Plan to r/o endocarditis.  Pending echo today. Tentatively on cath board for 12/12/19.  3. HTN - Bp stable on current medications.   4. HLD   For questions or updates, please contact [place] Please consult [email or web address] for contact info under        SignedLeanor Kail, PA  12/10/2019, 7:48 AM    Patient seen and examined. Agree with assessment and plan. Discussed with Dr. Tamala Julian. I/O -740 cc since admission which has been recorded, but patient states he urinated all through the night significantly which had not been recorded. He is breathing much more comfortably. JVD approximately 7-8 cm. Slightly decreased breath sounds at bases without wheezing. Heart rate in the [id number] with systolic and diastolic murmur in the aortic region. Trace ankle edema. We will continue with IV diuresis today. Plan for 2D echo Doppler study to assess systolic and diastolic function as well as the  integrity of his bioprosthetic aortic valve replacement. We will repeat electrolytes and BNP level tomorrow. If renal function stable and breathing continues to improve, possible right left heart catheterization tomorrow rather than the following day. Will obtain the [facility] x 2 today.    Troy Sine, MD, [facility] 12/10/2019 11:27 AM

## 2019-12-10 NOTE — Progress Notes (Signed)
Echocardiogram 2D Echocardiogram has been performed.  Oneal Deputy Douglas Hart 12/10/2019, 1:32 PM

## 2019-12-10 NOTE — Plan of Care (Signed)
  Problem: Education: Goal: Knowledge of General Education information will improve Description: Including pain rating scale, medication(s)/side effects and non-pharmacologic comfort measures Outcome: Progressing   Problem: Clinical Measurements: Goal: Will remain free from infection Outcome: Progressing   Problem: Nutrition: Goal: Adequate nutrition will be maintained Outcome: Progressing   Problem: Coping: Goal: Level of anxiety will decrease Outcome: Progressing   Problem: Pain Managment: Goal: General experience of comfort will improve Outcome: Progressing   Problem: Safety: Goal: Ability to remain free from injury will improve Outcome: Progressing   Problem: Skin Integrity: Goal: Risk for impaired skin integrity will decrease Outcome: Progressing   

## 2019-12-11 ENCOUNTER — Encounter (HOSPITAL_COMMUNITY): Payer: Self-pay | Admitting: Interventional Cardiology

## 2019-12-11 ENCOUNTER — Encounter (HOSPITAL_COMMUNITY)
Admission: AD | Disposition: A | Discharge status: Home / Self Care | Payer: Self-pay | Source: Home / Self Care | Attending: Interventional Cardiology

## 2019-12-11 ENCOUNTER — Other Ambulatory Visit: Payer: Self-pay

## 2019-12-11 ENCOUNTER — Inpatient Hospital Stay (HOSPITAL_COMMUNITY): Payer: BC Managed Care – PPO

## 2019-12-11 DIAGNOSIS — Z9989 Dependence on other enabling machines and devices: Secondary | ICD-10-CM

## 2019-12-11 DIAGNOSIS — I5031 Acute diastolic (congestive) heart failure: Secondary | ICD-10-CM

## 2019-12-11 DIAGNOSIS — I2510 Atherosclerotic heart disease of native coronary artery without angina pectoris: Secondary | ICD-10-CM

## 2019-12-11 DIAGNOSIS — G4733 Obstructive sleep apnea (adult) (pediatric): Secondary | ICD-10-CM

## 2019-12-11 DIAGNOSIS — I350 Nonrheumatic aortic (valve) stenosis: Secondary | ICD-10-CM

## 2019-12-11 HISTORY — PX: RIGHT HEART CATH AND CORONARY/GRAFT ANGIOGRAPHY: CATH118265

## 2019-12-11 LAB — POCT I-STAT EG7
Acid-base deficit: 1 mmol/L (ref 0.0–2.0)
Bicarbonate: 24.2 mmol/L (ref 20.0–28.0)
Calcium, Ion: 1.3 mmol/L (ref 1.15–1.40)
HCT: 32 % — ABNORMAL LOW (ref 39.0–52.0)
Hemoglobin: 10.9 g/dL — ABNORMAL LOW (ref 13.0–17.0)
O2 Saturation: 72 %
Potassium: 3.5 mmol/L (ref 3.5–5.1)
Sodium: 143 mmol/L (ref 135–145)
TCO2: 25 mmol/L (ref 22–32)
pCO2, Ven: 39.4 mmHg — ABNORMAL LOW (ref 44.0–60.0)
pH, Ven: 7.397 (ref 7.250–7.430)
pO2, Ven: 38 mmHg (ref 32.0–45.0)

## 2019-12-11 LAB — POCT I-STAT 7, (LYTES, BLD GAS, ICA,H+H)
Acid-base deficit: 1 mmol/L (ref 0.0–2.0)
Bicarbonate: 22.5 mmol/L (ref 20.0–28.0)
Calcium, Ion: 1.32 mmol/L (ref 1.15–1.40)
HCT: 32 % — ABNORMAL LOW (ref 39.0–52.0)
Hemoglobin: 10.9 g/dL — ABNORMAL LOW (ref 13.0–17.0)
O2 Saturation: 97 %
Potassium: 3.5 mmol/L (ref 3.5–5.1)
Sodium: 144 mmol/L (ref 135–145)
TCO2: 23 mmol/L (ref 22–32)
pCO2 arterial: 33.9 mmHg (ref 32.0–48.0)
pH, Arterial: 7.43 (ref 7.350–7.450)
pO2, Arterial: 87 mmHg (ref 83.0–108.0)

## 2019-12-11 LAB — CBC
HCT: 35.8 % — ABNORMAL LOW (ref 39.0–52.0)
Hemoglobin: 11.8 g/dL — ABNORMAL LOW (ref 13.0–17.0)
MCH: 32.2 pg (ref 26.0–34.0)
MCHC: 33 g/dL (ref 30.0–36.0)
MCV: 97.5 fL (ref 80.0–100.0)
Platelets: 131 10*3/uL — ABNORMAL LOW (ref 150–400)
RBC: 3.67 MIL/uL — ABNORMAL LOW (ref 4.22–5.81)
RDW: 14.8 % (ref 11.5–15.5)
WBC: 5.7 10*3/uL (ref 4.0–10.5)
nRBC: 0 % (ref 0.0–0.2)

## 2019-12-11 LAB — BASIC METABOLIC PANEL
Anion gap: 7 (ref 5–15)
BUN: 24 mg/dL — ABNORMAL HIGH (ref 8–23)
CO2: 21 mmol/L — ABNORMAL LOW (ref 22–32)
Calcium: 9.1 mg/dL (ref 8.9–10.3)
Chloride: 108 mmol/L (ref 98–111)
Creatinine, Ser: 1.19 mg/dL (ref 0.61–1.24)
GFR calc Af Amer: >60 mL/min (ref >60)
GFR calc non Af Amer: 59 mL/min — ABNORMAL LOW (ref >60)
Glucose, Bld: 93 mg/dL (ref 70–99)
Potassium: 4 mmol/L (ref 3.5–5.1)
Sodium: 136 mmol/L (ref 135–145)

## 2019-12-11 LAB — MAGNESIUM: Magnesium: 2.3 mg/dL (ref 1.7–2.4)

## 2019-12-11 SURGERY — RIGHT HEART CATH AND CORONARY/GRAFT ANGIOGRAPHY
Anesthesia: LOCAL

## 2019-12-11 MED ORDER — LIDOCAINE HCL (PF) 1 % IJ SOLN
INTRAMUSCULAR | Status: AC
  Filled 2019-12-11: qty 30

## 2019-12-11 MED ORDER — FENTANYL CITRATE (PF) 100 MCG/2ML IJ SOLN
INTRAMUSCULAR | Status: DC | PRN
  Administered 2019-12-11: 25 ug via INTRAVENOUS

## 2019-12-11 MED ORDER — ONDANSETRON HCL 4 MG/2ML IJ SOLN: 4.0000 mg | Freq: Four times a day (QID) | INTRAMUSCULAR | Status: DC | PRN

## 2019-12-11 MED ORDER — SODIUM CHLORIDE 0.9 % IV SOLN: INTRAVENOUS | Status: AC

## 2019-12-11 MED ORDER — HYDRALAZINE HCL 20 MG/ML IJ SOLN: 10.0000 mg | INTRAMUSCULAR | Status: AC | PRN

## 2019-12-11 MED ORDER — SODIUM CHLORIDE 0.9 % IV SOLN: 250.0000 mL | INTRAVENOUS | Status: DC | PRN

## 2019-12-11 MED ORDER — IOHEXOL 350 MG/ML SOLN
INTRAVENOUS | Status: DC | PRN
  Administered 2019-12-11: 130 mL

## 2019-12-11 MED ORDER — SODIUM CHLORIDE 0.9% FLUSH: 3.0000 mL | INTRAVENOUS | Status: DC | PRN

## 2019-12-11 MED ORDER — VERAPAMIL HCL 2.5 MG/ML IV SOLN
INTRAVENOUS | Status: AC
  Filled 2019-12-11: qty 2

## 2019-12-11 MED ORDER — FUROSEMIDE 10 MG/ML IJ SOLN
40.0000 mg | Freq: Every day | INTRAMUSCULAR | Status: DC
  Administered 2019-12-12 – 2019-12-15 (×4): 40 mg via INTRAVENOUS
  Filled 2019-12-11 (×4): qty 4

## 2019-12-11 MED ORDER — VERAPAMIL HCL 2.5 MG/ML IV SOLN
INTRAVENOUS | Status: DC | PRN
  Administered 2019-12-11: 10 mL via INTRA_ARTERIAL

## 2019-12-11 MED ORDER — ASPIRIN 81 MG PO CHEW
81.0000 mg | CHEWABLE_TABLET | Freq: Every day | ORAL | Status: DC
  Administered 2019-12-11 – 2019-12-16 (×6): 81 mg via ORAL
  Filled 2019-12-11 (×6): qty 1

## 2019-12-11 MED ORDER — LIDOCAINE HCL (PF) 1 % IJ SOLN
INTRAMUSCULAR | Status: DC | PRN
  Administered 2019-12-11 (×2): 2 mL

## 2019-12-11 MED ORDER — MIDAZOLAM HCL 2 MG/2ML IJ SOLN
INTRAMUSCULAR | Status: AC
  Filled 2019-12-11: qty 2

## 2019-12-11 MED ORDER — ASPIRIN 81 MG PO CHEW: 81.0000 mg | CHEWABLE_TABLET | ORAL | Status: DC

## 2019-12-11 MED ORDER — HEPARIN SODIUM (PORCINE) 1000 UNIT/ML IJ SOLN
INTRAMUSCULAR | Status: AC
  Filled 2019-12-11: qty 1

## 2019-12-11 MED ORDER — SODIUM CHLORIDE 0.9% FLUSH
3.0000 mL | Freq: Two times a day (BID) | INTRAVENOUS | Status: DC
  Administered 2019-12-11 – 2019-12-16 (×10): 3 mL via INTRAVENOUS

## 2019-12-11 MED ORDER — HEPARIN (PORCINE) IN NACL 1000-0.9 UT/500ML-% IV SOLN
INTRAVENOUS | Status: DC | PRN
  Administered 2019-12-11 (×2): 500 mL

## 2019-12-11 MED ORDER — ENOXAPARIN SODIUM 40 MG/0.4ML ~~LOC~~ SOLN
40.0000 mg | SUBCUTANEOUS | Status: DC
  Administered 2019-12-12 – 2019-12-16 (×5): 40 mg via SUBCUTANEOUS
  Filled 2019-12-11 (×5): qty 0.4

## 2019-12-11 MED ORDER — LABETALOL HCL 5 MG/ML IV SOLN: 10.0000 mg | INTRAVENOUS | Status: AC | PRN

## 2019-12-11 MED ORDER — MIDAZOLAM HCL 2 MG/2ML IJ SOLN
INTRAMUSCULAR | Status: DC | PRN
  Administered 2019-12-11 (×2): 1 mg via INTRAVENOUS

## 2019-12-11 MED ORDER — SODIUM CHLORIDE 0.9% FLUSH
3.0000 mL | Freq: Two times a day (BID) | INTRAVENOUS | Status: DC
  Administered 2019-12-13 – 2019-12-16 (×7): 3 mL via INTRAVENOUS

## 2019-12-11 MED ORDER — SODIUM CHLORIDE 0.9 % IV SOLN: INTRAVENOUS | Status: DC

## 2019-12-11 MED ORDER — HEPARIN SODIUM (PORCINE) 1000 UNIT/ML IJ SOLN
INTRAMUSCULAR | Status: DC | PRN
  Administered 2019-12-11: 5000 [IU] via INTRAVENOUS

## 2019-12-11 MED ORDER — FUROSEMIDE 10 MG/ML IJ SOLN
INTRAMUSCULAR | Status: AC
  Administered 2019-12-11: 40 mg via INTRAVENOUS
  Filled 2019-12-11: qty 4

## 2019-12-11 MED ORDER — FENTANYL CITRATE (PF) 100 MCG/2ML IJ SOLN
INTRAMUSCULAR | Status: AC
  Filled 2019-12-11: qty 2

## 2019-12-11 MED ORDER — HEPARIN (PORCINE) IN NACL 1000-0.9 UT/500ML-% IV SOLN
INTRAVENOUS | Status: AC
  Filled 2019-12-11: qty 1000

## 2019-12-11 MED ORDER — ACETAMINOPHEN 325 MG PO TABS: 650.0000 mg | ORAL_TABLET | ORAL | Status: DC | PRN

## 2019-12-11 SURGICAL SUPPLY — 16 items
CATH 5FR JL3.5 JR4 ANG PIG MP (CATHETERS) ×1 IMPLANT
CATH BALLN WEDGE 5F 110CM (CATHETERS) ×1 IMPLANT
DEVICE RAD COMP TR BAND LRG (VASCULAR PRODUCTS) ×1 IMPLANT
GLIDESHEATH SLEND A-KIT 6F 22G (SHEATH) ×2 IMPLANT
GUIDEWIRE INQWIRE 1.5J.035X260 (WIRE) IMPLANT
INQWIRE 1.5J .035X260CM (WIRE) ×2
KIT HEART LEFT (KITS) ×2 IMPLANT
PACK CARDIAC CATHETERIZATION (CUSTOM PROCEDURE TRAY) ×2 IMPLANT
SHEATH GLIDE SLENDER 4/5FR (SHEATH) ×1 IMPLANT
SHEATH PROBE COVER 6X72 (BAG) ×1 IMPLANT
SYR MEDRAD MARK 7 150ML (SYRINGE) ×1 IMPLANT
TRANSDUCER W/STOPCOCK (MISCELLANEOUS) ×2 IMPLANT
TUBING CIL FLEX 10 FLL-RA (TUBING) ×2 IMPLANT
TUBING CONTRAST HIGH PRESS 20 (MISCELLANEOUS) ×1 IMPLANT
WIRE HI TORQ VERSACORE-J 145CM (WIRE) ×1 IMPLANT
WIRE MICROINTRODUCER 60CM (WIRE) ×1 IMPLANT

## 2019-12-11 NOTE — Interval H&P Note (Signed)
Cath Lab Visit (complete for each Cath Lab visit)  Clinical Evaluation Leading to the Procedure:   ACS: No.  Non-ACS:    Anginal Classification: CCS III  Anti-ischemic medical therapy: Minimal Therapy (1 class of medications)  Non-Invasive Test Results: No non-invasive testing performed  Prior CABG: No previous CABG      History and Physical Interval Note:  12/11/2019 3:31 PM  Douglas Hart  has presented today for surgery, with the diagnosis of hf  ar.  The various methods of treatment have been discussed with the patient and family. After consideration of risks, benefits and other options for treatment, the patient has consented to  Procedure(s): RIGHT/LEFT HEART CATH AND CORONARY ANGIOGRAPHY (N/A) as a surgical intervention.  The patient's history has been reviewed, patient examined, no change in status, stable for surgery.  I have reviewed the patient's chart and labs.  Questions were answered to the patient's satisfaction.     Belva Crome III

## 2019-12-11 NOTE — Progress Notes (Addendum)
Progress Note  Patient Name: HENDRICK SCHIELKE Date of Encounter: 12/11/2019  Primary Cardiologist: [name], MD   Subjective   Breathing has significantly improved.   Inpatient Medications    Scheduled Meds: . enoxaparin (LOVENOX) injection  40 mg Subcutaneous Q24H  . Living Better with Heart Failure Book   Does not apply Once  . metoprolol succinate  50 mg Oral Daily  . sodium chloride flush  3 mL Intravenous Q12H   Continuous Infusions: . sodium chloride     PRN Meds: sodium chloride, acetaminophen, ondansetron (ZOFRAN) IV, sodium chloride flush   Vital Signs    Vitals:   12/10/19 1652 12/10/19 1731 12/10/19 2005 12/11/19 0539  BP: (!) 144/57 (!) 139/59 140/86 (!) 141/55  Pulse: 78 75 74 74  Resp:   20   Temp: 98 F (36.7 C) 97.9 F (36.6 C) 98.2 F (36.8 C) 97.6 F (36.4 C)  TempSrc: Oral Oral Oral Oral  SpO2: 96% 93%  95%  Weight:    109.1 kg  Height:        Intake/Output Summary (Last 24 hours) at 12/11/2019 [id number] Last data filed at 12/10/2019 2300 Gross per 24 hour  Intake 222 ml  Output 2050 ml  Net -1828 ml   Last 3 Weights 12/11/2019 12/10/2019 12/09/2019  Weight (lbs) 240 lb 9.6 oz 242 lb 1.6 oz 247 lb 1.9 oz  Weight (kg) 109.135 kg 109.816 kg 112.093 kg      Telemetry    NSR - Personally Reviewed  ECG    No new tracing.   Physical Exam  Pleasant older WM, sitting up on the side of the bed.  GEN: No acute distress.   Neck: No JVD Cardiac: RRR, + systolic murmur, rubs, or gallops.  Respiratory: Clear to auscultation bilaterally. GI: Soft, nontender, non-distended  MS: No edema; No deformity. Neuro:  Nonfocal  Psych: Normal affect   Labs    High Sensitivity Troponin:  No results for input(s): TROPONINIHS in the last 720 hours.    Chemistry Recent Labs  Lab 12/09/19 2053 12/10/19 0435 12/11/19 0215  NA 140 142 136  K 3.8 3.5 4.0  CL 108 108 108  CO2 21* 24 21*  GLUCOSE 124* 100* 93  BUN 19 19 24*  CREATININE 1.13 1.24  1.19  CALCIUM 9.4 9.2 9.1  PROT 6.5  --   --   ALBUMIN 3.7  --   --   AST 53*  --   --   ALT 62*  --   --   ALKPHOS 67  --   --   BILITOT 1.1  --   --   GFRNONAA >60 57* 59*  GFRAA >60 >60 >60  ANIONGAP 11 10 7      Hematology Recent Labs  Lab 12/09/19 2053 12/10/19 0435 12/11/19 0215  WBC 6.3 5.6 5.7  RBC 3.60* 3.43* 3.67*  HGB 11.4* 11.0* 11.8*  HCT 34.3* 33.0* 35.8*  MCV 95.3 96.2 97.5  MCH 31.7 32.1 32.2  MCHC 33.2 33.3 33.0  RDW 14.6 14.6 14.8  PLT 134* 118* 131*    BNP Recent Labs  Lab 12/09/19 2053  BNP 988.3*     DDimer No results for input(s): DDIMER in the last 168 hours.   Radiology    DG Chest 2 View  Result Date: 12/10/2019 CLINICAL DATA:  Chest pain and shortness of breath with fluid retention EXAM: CHEST - 2 VIEW COMPARISON:  10/20/2006 FINDINGS: Stable borderline cardiomegaly with symmetric  interstitial opacity and mild fissure thickening. A few Kerley lines are present. Aortic valve replacement. Trace pleural fluid. IMPRESSION: Mild CHF. Electronically Signed   By: Monte Fantasia M.D.   On: 12/10/2019 08:28   ECHOCARDIOGRAM COMPLETE  Result Date: 12/10/2019    ECHOCARDIOGRAM REPORT   Patient Name:   KINTA ASTER Date of Exam: 12/10/2019 Medical Rec #:  [id number]     Height:       73.0 in Accession #:    [id number]    Weight:       242.1 lb Date of Birth:  [birth year]     BSA:          2.334 m Patient Age:    74 years      BP:           140/46 mmHg Patient Gender: M             HR:           77 bpm. Exam Location:  Inpatient Procedure: 2D Echo, Color Doppler and Cardiac Doppler Indications:    Aortic Valve Disorder i35.9  History:        Patient has prior history of Echocardiogram examinations, most                 recent 11/14/2019. CHF, Arrythmias:Atrial Flutter; Risk                 Factors:Hypertension and Dyslipidemia. 2008 Bioprosthetic Aortic                 Valve Replacement.                 Aortic Valve: Unknown bioprosthetic valve is present in  the                 aortic position.  Sonographer:    Raquel Sarna Senior RDCS Referring Phys: Sandy  1. Left ventricular ejection fraction, by estimation, is 65 to 70%. The left ventricle has normal function. The left ventricle has no regional wall motion abnormalities. There is mild left ventricular hypertrophy. Left ventricular diastolic function could not be evaluated.  2. Right ventricular systolic function is normal. The right ventricular size is normal. There is mildly elevated pulmonary artery systolic pressure. The estimated right ventricular systolic pressure is 99991111 mmHg.  3. Left atrial size was severely dilated.  4. The mitral valve is abnormal. Trivial mitral valve regurgitation.  5. The tricuspid valve is abnormal.  6. Valve leaflets are thickened with reduced mobility - the cusp in the area of the left coronary artery is not well-visualized and may be immobile. The aortic valve has been repaired/replaced. Aortic valve regurgitation is severe. Mild to moderate aortic valve stenosis. There is a Unknown bioprosthetic valve present in the aortic position. Aortic regurgitation PHT measures 140 msec. Aortic valve area, by VTI measures 1.42 cm. Aortic valve mean gradient measures 26.0 mmHg. Aortic valve Vmax measures 3.27 m/s.  7. Aortic dilatation noted. 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  8. The inferior vena cava is dilated in size with <50% respiratory variability, suggesting right atrial pressure of 15 mmHg. 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 Recommend TEE to further evalute and to more definitively evaluate for endocarditis - this could not be excluded on the basis of this study. FINDINGS  Left Ventricle: Left ventricular ejection fraction, by estimation, is 65 to 70%. The left ventricle  has normal function. The left ventricle has no regional wall motion  abnormalities. The left ventricular internal cavity size was normal in size. There is  mild left ventricular hypertrophy. Left ventricular diastolic function could not be evaluated due to atrial fibrillation. Left ventricular diastolic function could not be evaluated. Right Ventricle: The right ventricular size is normal. No increase in right ventricular wall thickness. Right ventricular systolic function is normal. There is mildly elevated pulmonary artery systolic pressure. The tricuspid regurgitant velocity is 2.48  m/s, and with an assumed right atrial pressure of 15 mmHg, the estimated right ventricular systolic pressure is 99991111 mmHg. Left Atrium: Left atrial size was severely dilated. Right Atrium: Right atrial size was normal in size. Pericardium: There is no evidence of pericardial effusion. Mitral Valve: The mitral valve is abnormal. There is mild thickening of the mitral valve leaflet(s). Trivial mitral valve regurgitation. Tricuspid Valve: The tricuspid valve is abnormal. Tricuspid valve regurgitation is trivial. Aortic Valve: Valve leaflets are thickened with reduced mobility - the cusp in the area of the left coronary artery is not well-visualized and may be immobile. The aortic valve has been repaired/replaced. Aortic valve regurgitation is severe. Aortic regurgitation PHT measures 140 msec. Mild to moderate aortic stenosis is present. Aortic valve mean gradient measures 26.0 mmHg. Aortic valve peak gradient measures 42.8 mmHg. Aortic valve area, by VTI measures 1.42 cm. There is a Unknown bioprosthetic valve present in the aortic position. Pulmonic Valve: The pulmonic valve was normal in structure. Pulmonic valve regurgitation is not visualized. Aorta: Aortic dilatation noted. 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 Venous: The inferior vena cava is dilated in size with less than 50% respiratory variability, suggesting right atrial pressure of 15 mmHg. IAS/Shunts:  No atrial level shunt detected by color flow Doppler.  LEFT VENTRICLE PLAX 2D LVIDd:         4.90 cm  Diastology LVIDs:         3.00 cm  LV e' lateral:   8.16 cm/s LV PW:         1.10 cm  LV E/e' lateral: 13.7 LV IVS:        1.20 cm  LV e' medial:    5.66 cm/s LVOT diam:     2.10 cm  LV E/e' medial:  19.8 LV SV:         96 LV SV Index:   41 LVOT Area:     3.46 cm  RIGHT VENTRICLE RV S prime:     11.30 cm/s TAPSE (M-mode): 1.8 cm LEFT ATRIUM              Index       RIGHT ATRIUM           Index LA diam:        3.90 cm  1.67 cm/m  RA Area:     21.00 cm LA Vol (A2C):   141.0 ml 60.42 ml/m RA Volume:   62.00 ml  26.57 ml/m LA Vol (A4C):   133.0 ml 56.99 ml/m LA Biplane Vol: 143.0 ml 61.28 ml/m  AORTIC VALVE AV Area (Vmax):    1.29 cm AV Area (Vmean):   1.31 cm AV Area (VTI):     1.42 cm AV Vmax:           327.00 cm/s AV Vmean:          237.000 cm/s AV VTI:  0.678 m AV Peak Grad:      42.8 mmHg AV Mean Grad:      26.0 mmHg LVOT Vmax:         122.00 cm/s LVOT Vmean:        89.600 cm/s LVOT VTI:          0.278 m LVOT/AV VTI ratio: 0.41 AI PHT:            140 msec  AORTA Ao Root diam: 2.70 cm Ao Asc diam:  4.15 cm MITRAL VALVE                TRICUSPID VALVE MV Area (PHT): 3.42 cm     TR Peak grad:   24.6 mmHg MV Decel Time: 222 msec     TR Vmax:        248.00 cm/s MV E velocity: 112.00 cm/s                             SHUNTS                             Systemic VTI:  0.28 m                             Systemic Diam: 2.10 cm Lyman Bishop MD Electronically signed by Lyman Bishop MD Signature Date/Time: 12/10/2019/1:52:09 PM    Final     Cardiac Studies   Echo: 12/10/19  IMPRESSIONS    1. Left ventricular ejection fraction, by estimation, is 65 to 70%. The  left ventricle has normal function. The left ventricle has no regional  wall motion abnormalities. There is mild left ventricular hypertrophy.  Left ventricular diastolic function  could not be evaluated.  2. Right ventricular systolic  function is normal. The right ventricular  size is normal. There is mildly elevated pulmonary artery systolic  pressure. The estimated right ventricular systolic pressure is 99991111 mmHg.  3. Left atrial size was severely dilated.  4. The mitral valve is abnormal. Trivial mitral valve regurgitation.  5. The tricuspid valve is abnormal.  6. Valve leaflets are thickened with reduced mobility - the cusp in the  area of the left coronary artery is not well-visualized and may be  immobile. The aortic valve has been repaired/replaced. Aortic valve  regurgitation is severe. Mild to moderate  aortic valve stenosis. There is a Unknown bioprosthetic valve present in  the aortic position. Aortic regurgitation PHT measures 140 msec. Aortic  valve area, by VTI measures 1.42 cm. Aortic valve mean gradient measures  26.0 mmHg. Aortic valve Vmax  measures 3.27 m/s.  7. Aortic dilatation noted. There is mild to moderate dilatation of the  ascending aorta measuring 42 mm.  8. The inferior vena cava is dilated in size with <50% respiratory  variability, suggesting right atrial pressure of 15 mmHg.   Patient Profile     74 y.o. male  with a hx of bioprosthetic aortic valve replacement in 2008 by Dr. [name] atrial flutter,hypertension, hyperlipidemia and recent admission to [facility] 11/14/19 with pneumonia who directly admitted from clinic for acute on chronic combined CHF.  Assessment & Plan    1. Acute on chronic combined CHF: - BNP 988. Net - 2.3L. Weight is down. Will hold lasix this morning. Plan for [facility] this afternoon as he has improved from a respiratory stand point.  -  Continue BB  2. S/p bioprosthetic aortic valve replacement: echo noted above with valve area, by VTI measures 1.42 cm. Aortic valve mean gradient measures 26.0 mmHg. Aortic valve Vmax measures 3.27 m/s.  -- plan as above, cath this afternoon. The patient understands that risks included but are not limited  to stroke (1 in 1000), death (1 in 63), kidney failure [usually temporary] (1 in 500), bleeding (1 in 200), allergic reaction [possibly serious] (1 in 200).   3. HTN - Bp stable on current medications.   4. HLD  For questions or updates, please contact Vivian Please consult [email or web address] for contact info under      Signed, [name], NP  12/11/2019, 9:52 AM     Patient seen and examined. Agree with assessment and plan.  Patient feels better.  I/O -2388 since admission.  Blood cultures no growth so far but results pending.  PE consistent with systolic and diastolic aortic murmur consistent with AF/AR.  Echo Doppler study has confirmed severe aortic insufficiency with moderate aortic stenosis with a mean gradient of 26 and peak gradient 42.  LV function is normal.  There is mild to moderate aortic root dilatation at 42 mm.  Patient has been using his CPAP in the hospital for his obstructive sleep apnea.  Plan is for right and left heart cardiac catheterization this afternoon with Dr. Tamala Julian.  Patient aware of risks/benefits of the procedure.  Troy Sine, MD, [facility] 12/11/2019 11:37 AM

## 2019-12-11 NOTE — H&P (View-Only) (Signed)
Progress Note  Patient Name: Douglas Hart Date of Encounter: 12/11/2019  Primary Cardiologist: [name], MD   Subjective   Breathing has significantly improved.   Inpatient Medications    Scheduled Meds: . enoxaparin (LOVENOX) injection  40 mg Subcutaneous Q24H  . Living Better with Heart Failure Book   Does not apply Once  . metoprolol succinate  50 mg Oral Daily  . sodium chloride flush  3 mL Intravenous Q12H   Continuous Infusions: . sodium chloride     PRN Meds: sodium chloride, acetaminophen, ondansetron (ZOFRAN) IV, sodium chloride flush   Vital Signs    Vitals:   12/10/19 1652 12/10/19 1731 12/10/19 2005 12/11/19 0539  BP: (!) 144/57 (!) 139/59 140/86 (!) 141/55  Pulse: 78 75 74 74  Resp:   20   Temp: 98 F (36.7 C) 97.9 F (36.6 C) 98.2 F (36.8 C) 97.6 F (36.4 C)  TempSrc: Oral Oral Oral Oral  SpO2: 96% 93%  95%  Weight:    109.1 kg  Height:        Intake/Output Summary (Last 24 hours) at 12/11/2019 [id number] Last data filed at 12/10/2019 2300 Gross per 24 hour  Intake 222 ml  Output 2050 ml  Net -1828 ml   Last 3 Weights 12/11/2019 12/10/2019 12/09/2019  Weight (lbs) 240 lb 9.6 oz 242 lb 1.6 oz 247 lb 1.9 oz  Weight (kg) 109.135 kg 109.816 kg 112.093 kg      Telemetry    NSR - Personally Reviewed  ECG    No new tracing.   Physical Exam  Pleasant older WM, sitting up on the side of the bed.  GEN: No acute distress.   Neck: No JVD Cardiac: RRR, + systolic murmur, rubs, or gallops.  Respiratory: Clear to auscultation bilaterally. GI: Soft, nontender, non-distended  MS: No edema; No deformity. Neuro:  Nonfocal  Psych: Normal affect   Labs    High Sensitivity Troponin:  No results for input(s): TROPONINIHS in the last 720 hours.    Chemistry Recent Labs  Lab 12/09/19 2053 12/10/19 0435 12/11/19 0215  NA 140 142 136  K 3.8 3.5 4.0  CL 108 108 108  CO2 21* 24 21*  GLUCOSE 124* 100* 93  BUN 19 19 24*  CREATININE 1.13 1.24  1.19  CALCIUM 9.4 9.2 9.1  PROT 6.5  --   --   ALBUMIN 3.7  --   --   AST 53*  --   --   ALT 62*  --   --   ALKPHOS 67  --   --   BILITOT 1.1  --   --   GFRNONAA >60 57* 59*  GFRAA >60 >60 >60  ANIONGAP 11 10 7      Hematology Recent Labs  Lab 12/09/19 2053 12/10/19 0435 12/11/19 0215  WBC 6.3 5.6 5.7  RBC 3.60* 3.43* 3.67*  HGB 11.4* 11.0* 11.8*  HCT 34.3* 33.0* 35.8*  MCV 95.3 96.2 97.5  MCH 31.7 32.1 32.2  MCHC 33.2 33.3 33.0  RDW 14.6 14.6 14.8  PLT 134* 118* 131*    BNP Recent Labs  Lab 12/09/19 2053  BNP 988.3*     DDimer No results for input(s): DDIMER in the last 168 hours.   Radiology    DG Chest 2 View  Result Date: 12/10/2019 CLINICAL DATA:  Chest pain and shortness of breath with fluid retention EXAM: CHEST - 2 VIEW COMPARISON:  10/20/2006 FINDINGS: Stable borderline cardiomegaly with symmetric  interstitial opacity and mild fissure thickening. A few Kerley lines are present. Aortic valve replacement. Trace pleural fluid. IMPRESSION: Mild CHF. Electronically Signed   By: Monte Fantasia M.D.   On: 12/10/2019 08:28   ECHOCARDIOGRAM COMPLETE  Result Date: 12/10/2019    ECHOCARDIOGRAM REPORT   Patient Name:   Douglas Hart Date of Exam: 12/10/2019 Medical Rec #:  [id number]     Height:       73.0 in Accession #:    [id number]    Weight:       242.1 lb Date of Birth:  1946/06/10     BSA:          2.334 m Patient Age:    74 years      BP:           140/46 mmHg Patient Gender: M             HR:           77 bpm. Exam Location:  Inpatient Procedure: 2D Echo, Color Doppler and Cardiac Doppler Indications:    Aortic Valve Disorder i35.9  History:        Patient has prior history of Echocardiogram examinations, most                 recent 11/14/2019. CHF, Arrythmias:Atrial Flutter; Risk                 Factors:Hypertension and Dyslipidemia. 2008 Bioprosthetic Aortic                 Valve Replacement.                 Aortic Valve: Unknown bioprosthetic valve is present in  the                 aortic position.  Sonographer:    Raquel Sarna Senior RDCS Referring Phys: [place]  1. Left ventricular ejection fraction, by estimation, is 65 to 70%. The left ventricle has normal function. The left ventricle has no regional wall motion abnormalities. There is mild left ventricular hypertrophy. Left ventricular diastolic function could not be evaluated.  2. Right ventricular systolic function is normal. The right ventricular size is normal. There is mildly elevated pulmonary artery systolic pressure. The estimated right ventricular systolic pressure is 99991111 mmHg.  3. Left atrial size was severely dilated.  4. The mitral valve is abnormal. Trivial mitral valve regurgitation.  5. The tricuspid valve is abnormal.  6. Valve leaflets are thickened with reduced mobility - the cusp in the area of the left coronary artery is not well-visualized and may be immobile. The aortic valve has been repaired/replaced. Aortic valve regurgitation is severe. Mild to moderate aortic valve stenosis. There is a Unknown bioprosthetic valve present in the aortic position. Aortic regurgitation PHT measures 140 msec. Aortic valve area, by VTI measures 1.42 cm. Aortic valve mean gradient measures 26.0 mmHg. Aortic valve Vmax measures 3.27 m/s.  7. Aortic dilatation noted. 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  8. The inferior vena cava is dilated in size with <50% respiratory variability, suggesting right atrial pressure of 15 mmHg. 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 Recommend TEE to further evalute and to more definitively evaluate for endocarditis - this could not be excluded on the basis of this study. FINDINGS  Left Ventricle: Left ventricular ejection fraction, by estimation, is 65 to 70%. The left ventricle  has normal function. The left ventricle has no regional wall motion  abnormalities. The left ventricular internal cavity size was normal in size. There is  mild left ventricular hypertrophy. Left ventricular diastolic function could not be evaluated due to atrial fibrillation. Left ventricular diastolic function could not be evaluated. Right Ventricle: The right ventricular size is normal. No increase in right ventricular wall thickness. Right ventricular systolic function is normal. There is mildly elevated pulmonary artery systolic pressure. The tricuspid regurgitant velocity is 2.48  m/s, and with an assumed right atrial pressure of 15 mmHg, the estimated right ventricular systolic pressure is 99991111 mmHg. Left Atrium: Left atrial size was severely dilated. Right Atrium: Right atrial size was normal in size. Pericardium: There is no evidence of pericardial effusion. Mitral Valve: The mitral valve is abnormal. There is mild thickening of the mitral valve leaflet(s). Trivial mitral valve regurgitation. Tricuspid Valve: The tricuspid valve is abnormal. Tricuspid valve regurgitation is trivial. Aortic Valve: Valve leaflets are thickened with reduced mobility - the cusp in the area of the left coronary artery is not well-visualized and may be immobile. The aortic valve has been repaired/replaced. Aortic valve regurgitation is severe. Aortic regurgitation PHT measures 140 msec. Mild to moderate aortic stenosis is present. Aortic valve mean gradient measures 26.0 mmHg. Aortic valve peak gradient measures 42.8 mmHg. Aortic valve area, by VTI measures 1.42 cm. There is a Unknown bioprosthetic valve present in the aortic position. Pulmonic Valve: The pulmonic valve was normal in structure. Pulmonic valve regurgitation is not visualized. Aorta: Aortic dilatation noted. 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 Venous: The inferior vena cava is dilated in size with less than 50% respiratory variability, suggesting right atrial pressure of 15 mmHg. IAS/Shunts:  No atrial level shunt detected by color flow Doppler.  LEFT VENTRICLE PLAX 2D LVIDd:         4.90 cm  Diastology LVIDs:         3.00 cm  LV e' lateral:   8.16 cm/s LV PW:         1.10 cm  LV E/e' lateral: 13.7 LV IVS:        1.20 cm  LV e' medial:    5.66 cm/s LVOT diam:     2.10 cm  LV E/e' medial:  19.8 LV SV:         96 LV SV Index:   41 LVOT Area:     3.46 cm  RIGHT VENTRICLE RV S prime:     11.30 cm/s TAPSE (M-mode): 1.8 cm LEFT ATRIUM              Index       RIGHT ATRIUM           Index LA diam:        3.90 cm  1.67 cm/m  RA Area:     21.00 cm LA Vol (A2C):   141.0 ml 60.42 ml/m RA Volume:   62.00 ml  26.57 ml/m LA Vol (A4C):   133.0 ml 56.99 ml/m LA Biplane Vol: 143.0 ml 61.28 ml/m  AORTIC VALVE AV Area (Vmax):    1.29 cm AV Area (Vmean):   1.31 cm AV Area (VTI):     1.42 cm AV Vmax:           327.00 cm/s AV Vmean:          237.000 cm/s AV VTI:  0.678 m AV Peak Grad:      42.8 mmHg AV Mean Grad:      26.0 mmHg LVOT Vmax:         122.00 cm/s LVOT Vmean:        89.600 cm/s LVOT VTI:          0.278 m LVOT/AV VTI ratio: 0.41 AI PHT:            140 msec  AORTA Ao Root diam: 2.70 cm Ao Asc diam:  4.15 cm MITRAL VALVE                TRICUSPID VALVE MV Area (PHT): 3.42 cm     TR Peak grad:   24.6 mmHg MV Decel Time: 222 msec     TR Vmax:        248.00 cm/s MV E velocity: 112.00 cm/s                             SHUNTS                             Systemic VTI:  0.28 m                             Systemic Diam: 2.10 cm Lyman Bishop MD Electronically signed by Lyman Bishop MD Signature Date/Time: 12/10/2019/1:52:09 PM    Final     Cardiac Studies   Echo: 12/10/19  IMPRESSIONS    1. Left ventricular ejection fraction, by estimation, is 65 to 70%. The  left ventricle has normal function. The left ventricle has no regional  wall motion abnormalities. There is mild left ventricular hypertrophy.  Left ventricular diastolic function  could not be evaluated.  2. Right ventricular systolic  function is normal. The right ventricular  size is normal. There is mildly elevated pulmonary artery systolic  pressure. The estimated right ventricular systolic pressure is 99991111 mmHg.  3. Left atrial size was severely dilated.  4. The mitral valve is abnormal. Trivial mitral valve regurgitation.  5. The tricuspid valve is abnormal.  6. Valve leaflets are thickened with reduced mobility - the cusp in the  area of the left coronary artery is not well-visualized and may be  immobile. The aortic valve has been repaired/replaced. Aortic valve  regurgitation is severe. Mild to moderate  aortic valve stenosis. There is a Unknown bioprosthetic valve present in  the aortic position. Aortic regurgitation PHT measures 140 msec. Aortic  valve area, by VTI measures 1.42 cm. Aortic valve mean gradient measures  26.0 mmHg. Aortic valve Vmax  measures 3.27 m/s.  7. Aortic dilatation noted. There is mild to moderate dilatation of the  ascending aorta measuring 42 mm.  8. The inferior vena cava is dilated in size with <50% respiratory  variability, suggesting right atrial pressure of 15 mmHg.   Patient Profile     74 y.o. male  with a hx of bioprosthetic aortic valve replacement in 2008 by Dr. [name] atrial flutter,hypertension, hyperlipidemia and recent admission to [facility] 11/14/19 with pneumonia who directly admitted from clinic for acute on chronic combined CHF.  Assessment & Plan    1. Acute on chronic combined CHF: - BNP 988. Net - 2.3L. Weight is down. Will hold lasix this morning. Plan for [facility] this afternoon as he has improved from a respiratory stand point.  -  Continue BB  2. S/p bioprosthetic aortic valve replacement: echo noted above with valve area, by VTI measures 1.42 cm. Aortic valve mean gradient measures 26.0 mmHg. Aortic valve Vmax measures 3.27 m/s.  -- plan as above, cath this afternoon. The patient understands that risks included but are not limited  to stroke (1 in 1000), death (1 in 69), kidney failure [usually temporary] (1 in 500), bleeding (1 in 200), allergic reaction [possibly serious] (1 in 200).   3. HTN - Bp stable on current medications.   4. HLD  For questions or updates, please contact [place] Please consult [email or web address] for contact info under      Signed, [name], NP  12/11/2019, 9:52 AM     Patient seen and examined. Agree with assessment and plan.  Patient feels better.  I/O -2388 since admission.  Blood cultures no growth so far but results pending.  PE consistent with systolic and diastolic aortic murmur consistent with AF/AR.  Echo Doppler study has confirmed severe aortic insufficiency with moderate aortic stenosis with a mean gradient of 26 and peak gradient 42.  LV function is normal.  There is mild to moderate aortic root dilatation at 42 mm.  Patient has been using his CPAP in the hospital for his obstructive sleep apnea.  Plan is for right and left heart cardiac catheterization this afternoon with Dr. Tamala Julian.  Patient aware of risks/benefits of the procedure.  Troy Sine, MD, [facility] 12/11/2019 11:37 AM

## 2019-12-11 NOTE — CV Procedure (Signed)
   Right heart cath and left heart cath via right antecubital vein and radial artery respectively using real-time vascular ultrasound for access at both sites.  Severe pulmonary hypertension with associated severe elevation in pulmonary capillary wedge pressure, mean 34 mmHg.  Widely patent coronary arteries.  Severe aortic regurgitation secondary to dysfunction of bioprosthetic aortic valve.  We will need repeat valve replacement, surgical versus TAVR.  Dr.Edward [name] is the patient's cardiac surgeon.

## 2019-12-11 NOTE — Plan of Care (Signed)

## 2019-12-11 NOTE — Progress Notes (Signed)
Pt sat's 81% on RA, placed on 2L with little change, increased to 4L sat's 93%. Pt had dry hacky cough, but did not feel SOB. Paged Doreene Adas, PA. She stated Dr. Acie Fredrickson would assess pt. Cont to monitor. Carroll Kinds RN

## 2019-12-11 NOTE — Progress Notes (Signed)
   Was asked to look in on Douglas Hart by Douglas Hart, charge RN on 6E. Pt is just back from cath , Has severe AI,  Pulmonary HTN Did not get lasix today because of cath Will give Lasix 40 mg IV now Port. CXR . Supplemental O2.    Mertie Moores, MD  12/11/2019 5:48 PM    [address] [place],  [place], [place]  [address] Phone: [phone]; Fax: [phone]

## 2019-12-12 ENCOUNTER — Encounter: Payer: Self-pay | Admitting: Cardiothoracic Surgery

## 2019-12-12 ENCOUNTER — Ambulatory Visit (HOSPITAL_COMMUNITY)
Admission: RE | Admit: 2019-12-12 | Payer: BC Managed Care – PPO | Source: Home / Self Care | Admitting: Interventional Cardiology

## 2019-12-12 DIAGNOSIS — I2721 Secondary pulmonary arterial hypertension: Secondary | ICD-10-CM

## 2019-12-12 DIAGNOSIS — T82897A Other specified complication of cardiac prosthetic devices, implants and grafts, initial encounter: Principal | ICD-10-CM

## 2019-12-12 LAB — CBC
HCT: 34.6 % — ABNORMAL LOW (ref 39.0–52.0)
Hemoglobin: 11.4 g/dL — ABNORMAL LOW (ref 13.0–17.0)
MCH: 32 pg (ref 26.0–34.0)
MCHC: 32.9 g/dL (ref 30.0–36.0)
MCV: 97.2 fL (ref 80.0–100.0)
Platelets: 129 10*3/uL — ABNORMAL LOW (ref 150–400)
RBC: 3.56 MIL/uL — ABNORMAL LOW (ref 4.22–5.81)
RDW: 14.6 % (ref 11.5–15.5)
WBC: 6.3 10*3/uL (ref 4.0–10.5)
nRBC: 0 % (ref 0.0–0.2)

## 2019-12-12 LAB — BASIC METABOLIC PANEL
Anion gap: 12 (ref 5–15)
BUN: 25 mg/dL — ABNORMAL HIGH (ref 8–23)
CO2: 24 mmol/L (ref 22–32)
Calcium: 9.2 mg/dL (ref 8.9–10.3)
Chloride: 107 mmol/L (ref 98–111)
Creatinine, Ser: 1.22 mg/dL (ref 0.61–1.24)
GFR calc Af Amer: >60 mL/min (ref >60)
GFR calc non Af Amer: 58 mL/min — ABNORMAL LOW (ref >60)
Glucose, Bld: 96 mg/dL (ref 70–99)
Potassium: 3.8 mmol/L (ref 3.5–5.1)
Sodium: 143 mmol/L (ref 135–145)

## 2019-12-12 LAB — MAGNESIUM: Magnesium: 2.2 mg/dL (ref 1.7–2.4)

## 2019-12-12 LAB — BRAIN NATRIURETIC PEPTIDE: B Natriuretic Peptide: 884 pg/mL — ABNORMAL HIGH (ref 0.0–100.0)

## 2019-12-12 NOTE — Plan of Care (Signed)

## 2019-12-12 NOTE — Consult Note (Addendum)
[address]       [place] [address]             [id number]        Ewell C Nessler [place] Medical Record [id number] Date of Birth: 1946-04-20  Referring: No ref. provider found Primary Care: Street, Sharon Mt, MD Primary Cardiologist:Henry Nicholes Stairs III, MD  Chief Complaint:   Shortness of breath   History of Present Illness:  Douglas Hart is a very pleasant 74 year old gentleman with past history significant for hypertension, obstructive sleep apnea requiring CPAP who is status post aortic valve replacement with bioprosthetic valve by Dr. [name] in 2008.  He reports he recently joined good health and was symptom-free until late February this year.  Few days after receiving the first of his Covid vaccinations, he noticed significant shortness of breath with minimal activity and noticed he would fatigue more than usual.  His symptoms worsened over the next few days and he presented to [facility].  He was apparently diagnosed and treated for pneumonia but was also noted at the time to have significant left pleural effusion and underwent thoracentesis during that admission yielding 1 L of fluid from the left side.  During the same admission he had an right thoracentesis yielding about 300 mL of fluid.  After treatment with diuresis and antibiotics, he was discharged to home and followed up with his primary care physician. 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  Ejection fraction was estimated at 65 to 70%.  The mitral valve shows trivial regurgitation.  The ascending aorta was mildly dilated measuring 42 mm.  The prosthetic aortic valve leaflets were thickened with reduced mobility.  The mean gradient was measured at 26 with a peak gradient of 42.  The aortic valve area was estimated at 1.42  cm.  He was referred to Dr. Tamala Julian and admitted to the hospital for further management on 12/09/2019.  He is improved symptomatically with diuresis.  He underwent left and right heart catheterization on 12/11/2019 that demonstrated a 50 to 60% ostial circumflex lesion as well as a 50 to 60% mid LAD stenosis.  There was severe pulmonary hypertension on right heart cath.   He denies any chest pain or shortness of breath.  He reports his symptoms have improved substantially with diuresis.  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  He generally remains active and enjoys good health.  His last dental visit was in the fall 2020.  We have been asked to evaluate Douglas Hart in regards to management of his prosthetic valve dysfunction .  Current Activity/ Functional Status:   Zubrod Score: At the time of surgery this patient's most appropriate activity status/level should be described as: []     0    Normal activity, no symptoms [x]     1    Restricted in physical strenuous activity but ambulatory, able to do out light work []     2    Ambulatory and capable of self care, unable to do work activities, up and about                 more than 50%  Of the time                            []   3    Only limited self care, in bed greater than 50% of waking hours []     4    Completely disabled, no self care, confined to bed or chair []     5    Moribund  Past Medical History:  Diagnosis Date  . Asthma   . CHF (congestive heart failure) ([place])   . Fatigue   . Hearing loss   . Hypertension   . Knee pain   . Sleep apnea    uses cpap    Past Surgical History:  Procedure Laterality Date  . CARDIAC SURGERY    . RIGHT HEART CATH AND CORONARY/GRAFT ANGIOGRAPHY N/A 12/11/2019   Procedure: RIGHT HEART CATH AND CORONARY/GRAFT ANGIOGRAPHY;  Surgeon: Belva Crome, MD;  Location: [facility];  Service: Cardiovascular;  Laterality: N/A;    Social History   Tobacco Use  Smoking Status Never Smoker  Smokeless Tobacco Never Used    Social History   Substance and Sexual Activity  Alcohol Use No   Family History: Alzheimer's disease--mother Heart disease--father and brother Leukemia--brother  Allergies  Allergen Reactions  . Azithromycin     Can tolerate in small doses, eye burning    Current Facility-Administered Medications  Medication Dose Route Frequency Provider Last Rate Last Admin  . 0.9 %  sodium chloride infusion  250 mL Intravenous PRN Belva Crome, MD      . 0.9 %  sodium chloride infusion  250 mL Intravenous PRN Belva Crome, MD      . acetaminophen (TYLENOL) tablet 650 mg  650 mg Oral Q4H PRN Belva Crome, MD      . aspirin chewable tablet 81 mg  81 mg Oral Daily Belva Crome, MD   81 mg at 12/11/19 2031  . enoxaparin (LOVENOX) injection 40 mg  40 mg Subcutaneous Q24H Belva Crome, MD      . furosemide (LASIX) injection 40 mg  40 mg Intravenous Daily Nahser, Wonda Cheng, MD   40 mg at 12/11/19 1750  . Living Better with Heart Failure Book   Does not apply Once Belva Crome, MD      . metoprolol succinate (TOPROL-XL) 24 hr tablet 50 mg  50 mg Oral Daily Belva Crome, MD   50 mg at 12/11/19 0954  . ondansetron (ZOFRAN) injection 4 mg  4 mg Intravenous Q6H PRN Belva Crome, MD      . sodium chloride flush (NS) 0.9 % injection 3 mL  3 mL Intravenous Q12H Belva Crome, MD   3 mL at 12/11/19 0954  . sodium chloride flush (NS) 0.9 % injection 3 mL  3 mL Intravenous PRN Belva Crome, MD      . sodium chloride flush (NS) 0.9 % injection 3 mL  3 mL Intravenous Q12H Belva Crome, MD      . sodium chloride flush (NS) 0.9 % injection 3 mL  3 mL Intravenous Q12H Belva Crome, MD   3 mL at 12/11/19 2200  . sodium chloride flush (NS) 0.9 % injection 3 mL  3 mL Intravenous PRN Belva Crome, MD        Medications Prior to Admission   Medication Sig Dispense Refill Last Dose  . amLODipine (NORVASC) 5 MG tablet Take 1 tablet (5 mg total) by mouth daily. 90 tablet 3   . amoxicillin (AMOXIL) 500 MG capsule Take 2,000 mg by mouth See admin instructions. Take 2000 mg  by mouth 1 hour prior to dental appointments     . Ascorbic Acid (VITAMIN C) 1000 MG tablet Take 1,000 mg by mouth daily.     . furosemide (LASIX) 40 MG tablet Take 1 tablet (40 mg total) by mouth daily. 90 tablet 3   . lisinopril (PRINIVIL,ZESTRIL) 40 MG tablet Take 1 tablet (40 mg total) by mouth daily. 90 tablet 3   . metoprolol succinate (TOPROL-XL) 50 MG 24 hr tablet TAKE 1 TABLET BY MOUTH ONCE DAILY OR IMMEDIATELY FOLLOWING A MEAL (Patient taking differently: Take 50 mg by mouth daily. ) 90 tablet 2   . Multiple Vitamin (MULTIVITAMIN WITH MINERALS) TABS tablet Take 1 tablet by mouth daily.     . potassium chloride SA (K-DUR,KLOR-CON) 20 MEQ tablet Take 1 tablet (20 mEq total) by mouth daily. 90 tablet 3   . zinc gluconate 50 MG tablet Take 50 mg by mouth daily.       Family History  Problem Relation Age of Onset  . Alzheimer's disease Mother   . Heart disease Father 75       CABG  . Hypertension Father   . Other Sister        Knee problems  . Heart disease Brother        STENTS PLACED  . Leukemia Brother 55  . Other Daughter        [place]  . Other Daughter        [place]  . Hypertension Brother   . Heart attack Neg Hx   . Stroke Neg Hx      Review of Systems:   ROS     Cardiac Review of Systems: Y or  [    ]= no  Chest Pain [    ]  Resting SOB [ xmore  ] Exertional SOB  [ x]]  Orthopnea [  ]   Pedal Edema [ x  ]    Palpitations [  ] Syncope  [  ]   Presyncope [   ]  General Review of Systems: [Y] = yes [  ]=no Constitional: recent weight change [  ]; anorexia [  ]; fatigue [ x ]; nausea [  ]; night sweats [  ]; fever [  ]; or chills [  ]                                                               Dental: Last Dentist visit: Fall  2020  Eye : blurred vision [  ]; diplopia [   ]; vision changes [  ];  Amaurosis fugax[  ]; Resp: cough [  ];  wheezing[  ];  hemoptysis[  ]; shortness of breath[x ]; paroxysmal nocturnal dyspnea[x  ]; dyspnea on exertion[  ]; or orthopnea[  ];  GI:  gallstones[  ], vomiting[  ];  dysphagia[  ]; melena[  ];  hematochezia [  ]; heartburn[  ];   Hx of  Colonoscopy[  ]; GU: kidney stones [  ]; hematuria[  ];   dysuria [  ];  nocturia[  ];  history of     obstruction [  ]; urinary frequency [  ]             Skin: rash, swelling[  ];,  hair loss[  ];  peripheral edema[  ];  or itching[  ]; Musculosketetal: myalgias[  ];  joint swelling[  ];  joint erythema[  ];  joint pain[  ];  back pain[  ];  Heme/Lymph: bruising[  ];  bleeding[  ];  anemia[  ];  Neuro: TIA[  ];  headaches[  ];  stroke[  ];  vertigo[  ];  seizures[  ];   paresthesias[  ];  difficulty walking[  ];  Psych:depression[  ]; anxiety[  ];  Endocrine: diabetes[  ];  thyroid dysfunction[  ];               Physical Exam: BP (!) 137/52 (BP Location: Left Arm)   Pulse 72   Temp 97.9 F (36.6 C) (Oral)   Resp 15   Ht 6\' 1"  (1.854 m)   Wt 108.6 kg   SpO2 96%   BMI 31.59 kg/m    General appearance: alert, cooperative and no distress Head: Normocephalic, without obvious abnormality, atraumatic Neck: no adenopathy, no carotid bruit, no JVD, supple, symmetrical, trachea midline and thyroid not enlarged, symmetric, no tenderness/mass/nodules Lymph nodes: Cervical, supraclavicular, and axillary nodes normal. and No obvious lymphadenopathy Resp: Clear to auscultation anterior and posterior Cardio: regular rate and rhythm. There is a loud, high-pitched murmur with both systolic and diastolic components.  GI: Soft, nontender Extremities: Lower extremity edema.  All extremities are well perfused.  He was cathed via the right radial artery.  He has strongly palpable radial arteries bilaterally.  Both feet are well-perfused with brisk  capillary refill Neurologic: Grossly normal  Diagnostic Studies & Laboratory data:  ECHOCARDIOGRAM REPORT         Patient Name:   DEMONTREY DAVYDOV Date of Exam: 12/10/2019  Medical Rec #:  [id number]     Height:       73.0 in  Accession #:    [id number]    Weight:       242.1 lb  Date of Birth:  [birth year]     BSA:          2.334 m  Patient Age:    55 years      BP:           140/46 mmHg  Patient Gender: M             HR:           77 bpm.  Exam Location:  Inpatient   Procedure: 2D Echo, Color Doppler and Cardiac Doppler   Indications:    Aortic Valve Disorder i35.9     History:        Patient has prior history of Echocardiogram examinations,  most                  recent 11/14/2019. CHF, Arrythmias:Atrial Flutter; Risk                  Factors:Hypertension and Dyslipidemia. 2008 Bioprosthetic  Aortic                  Valve Replacement.                  Aortic Valve: Unknown bioprosthetic valve is present in  the                  aortic position.     Sonographer:    Raquel Sarna Senior RDCS  Referring Phys: [place]  1. Left ventricular ejection fraction, by estimation, is 65 to 70%. The  left ventricle has normal function. The left ventricle has no regional  wall motion abnormalities. There is mild left ventricular hypertrophy.  Left ventricular diastolic function  could not be evaluated.   2. Right ventricular systolic function is normal. The right ventricular  size is normal. There is mildly elevated pulmonary artery systolic  pressure. The estimated right ventricular systolic pressure is 99991111 mmHg.   3. Left atrial size was severely dilated.   4. The mitral valve is abnormal. Trivial mitral valve regurgitation.   5. The tricuspid valve is abnormal.   6. Valve leaflets are thickened with reduced mobility - the cusp in the  area of the left coronary artery is not well-visualized and may be  immobile. The aortic valve has been repaired/replaced. Aortic  valve  regurgitation is severe. Mild to moderate  aortic valve stenosis. There is a Unknown bioprosthetic valve present in  the aortic position. Aortic regurgitation PHT measures 140 msec. Aortic  valve area, by VTI measures 1.42 cm. Aortic valve mean gradient measures  26.0 mmHg. Aortic valve Vmax  measures 3.27 m/s.   7. Aortic dilatation noted. There is mild to moderate dilatation of the  ascending aorta measuring 42 mm.   8. The inferior vena cava is dilated in size with <50% respiratory  variability, suggesting right atrial pressure of 15 mmHg.   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 Recommend TEE to further evalute and  to more definitively evaluate for endocarditis - this could not be  excluded on the basis of this study.   FINDINGS   Left Ventricle: Left ventricular ejection fraction, by estimation, is 65  to 70%. The left ventricle has normal function. The left ventricle has no  regional wall motion abnormalities. The left ventricular internal cavity  size was normal in size. There is   mild left ventricular hypertrophy. Left ventricular diastolic function  could not be evaluated due to atrial fibrillation. Left ventricular  diastolic function could not be evaluated.   Right Ventricle: The right ventricular size is normal. No increase in  right ventricular wall thickness. Right ventricular systolic function is  normal. There is mildly elevated pulmonary artery systolic pressure. The  tricuspid regurgitant velocity is 2.48   m/s, and with an assumed right atrial pressure of 15 mmHg, the estimated  right ventricular systolic pressure is 99991111 mmHg.   Left Atrium: Left atrial size was severely dilated.   Right Atrium: Right atrial size was normal in size.   Pericardium: There is no evidence of pericardial effusion.   Mitral Valve: The mitral valve is abnormal. There is mild thickening of  the mitral  valve leaflet(s). Trivial mitral valve regurgitation.   Tricuspid Valve: The tricuspid valve is abnormal. Tricuspid valve  regurgitation is trivial.   Aortic Valve: Valve leaflets are thickened with reduced mobility - the  cusp in the area of the left coronary artery is not well-visualized and  may be immobile. The aortic valve has been repaired/replaced. Aortic valve  regurgitation is severe. Aortic  regurgitation PHT measures 140 msec. Mild to moderate aortic stenosis is  present. Aortic valve mean gradient measures 26.0 mmHg. Aortic valve peak  gradient measures 42.8 mmHg. Aortic valve area, by VTI measures 1.42 cm.  There is a Unknown bioprosthetic  valve present in the aortic position.   Pulmonic Valve: The pulmonic valve was normal in structure. Pulmonic valve  regurgitation is  not visualized.   Aorta: Aortic dilatation noted. There is mild to moderate dilatation of  the ascending aorta measuring 42 mm.   Venous: The inferior vena cava is dilated in size with less than 50%  respiratory variability, suggesting right atrial pressure of 15 mmHg.   IAS/Shunts: No atrial level shunt detected by color flow Doppler.      LEFT VENTRICLE  PLAX 2D  LVIDd:         4.90 cm  Diastology  LVIDs:         3.00 cm  LV e' lateral:   8.16 cm/s  LV PW:         1.10 cm  LV E/e' lateral: 13.7  LV IVS:        1.20 cm  LV e' medial:    5.66 cm/s  LVOT diam:     2.10 cm  LV E/e' medial:  19.8  LV SV:         96  LV SV Index:   41  LVOT Area:     3.46 cm      RIGHT VENTRICLE  RV S prime:     11.30 cm/s  TAPSE (M-mode): 1.8 cm   LEFT ATRIUM              Index       RIGHT ATRIUM           Index  LA diam:        3.90 cm  1.67 cm/m  RA Area:     21.00 cm  LA Vol (A2C):   141.0 ml 60.42 ml/m RA Volume:   62.00 ml  26.57 ml/m  LA Vol (A4C):   133.0 ml 56.99 ml/m  LA Biplane Vol: 143.0 ml 61.28 ml/m   AORTIC VALVE  AV Area (Vmax):    1.29 cm  AV Area (Vmean):   1.31 cm  AV Area  (VTI):     1.42 cm  AV Vmax:           327.00 cm/s  AV Vmean:          237.000 cm/s  AV VTI:            0.678 m  AV Peak Grad:      42.8 mmHg  AV Mean Grad:      26.0 mmHg  LVOT Vmax:         122.00 cm/s  LVOT Vmean:        89.600 cm/s  LVOT VTI:          0.278 m  LVOT/AV VTI ratio: 0.41  AI PHT:            140 msec     AORTA  Ao Root diam: 2.70 cm  Ao Asc diam:  4.15 cm   MITRAL VALVE                TRICUSPID VALVE  MV Area (PHT): 3.42 cm     TR Peak grad:   24.6 mmHg  MV Decel Time: 222 msec     TR Vmax:        248.00 cm/s  MV E velocity: 112.00 cm/s                              SHUNTS  Systemic VTI:  0.28 m                              Systemic Diam: 2.10 cm   Lyman Bishop MD  Electronically signed by Lyman Bishop MD  Signature Date/Time: 12/10/2019/1:52:09 PM      RIGHT HEART CATH AND CORONARY/GRAFT ANGIOGRAPHY  Conclusion   Severe pulmonary hypertension secondary to acute diastolic heart failure secondary to acute aortic regurgitation.  Widely patent left main, RCA, with 50 to 60% ostial circumflex and 50 to 60% mid LAD stenoses.  Severe bioprosthetic valve regurgitation.   RECOMMENDATIONS:    We will speak with Dr. [name] concerning management of this patient's aortic bioprosthetic dysfunction with severe aortic regurgitation.  Currently out of heart failure and feeling back to baseline.        Coronary Findings  Diagnostic Dominance: Right Left Anterior Descending  Mid LAD lesion 50% stenosed  Mid LAD lesion is 50% stenosed.  First Diagonal Branch  Vessel is large in size.  Second Diagonal Branch  Vessel is small in size.  Third Diagonal Branch  Vessel is small in size.  Left Circumflex  Ost Cx lesion 50% stenosed  Ost Cx lesion is 50% stenosed.  Intervention  No interventions have been documented. Right Heart  Right Heart Pressures Hemodynamic findings consistent with severe pulmonary hypertension. Elevated  LV EDP consistent with volume overload.  Right Atrium Right atrial pressure is elevated.  Wall Motion  Resting               Left Heart  Aortic Valve There is moderate aortic valve stenosis. There is moderate (3+) aortic regurgitation. The patient has a bioprosthetic aortic valve .  Coronary Diagrams  Diagnostic Dominance: Right  Fick Cardiac Output 8.03 L/min  Fick Cardiac Output Index 3.45 (L/min)/BSA  RA A Wave 18 mmHg  RA V Wave 19 mmHg  RA Mean 16 mmHg  RV Systolic Pressure 69 mmHg  RV Diastolic Pressure 11 mmHg  RV EDP 20 mmHg  PA Systolic Pressure 68 mmHg  PA Diastolic Pressure 32 mmHg  PA Mean 47 mmHg  PW A Wave 33 mmHg  PW V Wave 46 mmHg  PW Mean 34 mmHg  AO Systolic Pressure 0000000 mmHg  AO Diastolic Pressure 58 mmHg  AO Mean 87 mmHg  QP/QS 1  TPVR Index 13.61 HRUI  TSVR Index 25.2 HRUI  PVR SVR Ratio 0.18  TPVR/TSVR Ratio 0.54        Recent Radiology Findings:   EXAM: CHEST - 2 VIEW   COMPARISON:  10/20/2006   FINDINGS: Stable borderline cardiomegaly with symmetric interstitial opacity and mild fissure thickening. A few Kerley lines are present. Aortic valve replacement. Trace pleural fluid.   IMPRESSION: Mild CHF.     Electronically Signed   By: Monte Fantasia M.D.   On: 12/10/2019 08:28    I have independently reviewed the above radiologic studies and discussed with the patient   Recent Lab Findings: Lab Results  Component Value Date   WBC 6.3 12/12/2019   HGB 11.4 (L) 12/12/2019   HCT 34.6 (L) 12/12/2019   PLT 129 (L) 12/12/2019   GLUCOSE 96 12/12/2019   ALT 62 (H) 12/09/2019   AST 53 (H) 12/09/2019   NA 143 12/12/2019   K 3.8 12/12/2019   CL 107 12/12/2019   CREATININE 1.22 12/12/2019   BUN 25 (H) 12/12/2019   CO2 24 12/12/2019  Assessment / Plan:      74 year old gentleman status post aortic valve replacement with a bioprosthetic valve in 2008- At time of surgery had [place] 3000 pericardial  tissue valve placed- serial number [id number]  81mm. He  presents with acute combined systolic and diastolic heart failure with normal EF.  He has significant prosthetic valvular dysfunction with severe aortic insufficiency and aortic stenosis.  The mean gradient is 26, peak gradient 42, and aortic valve area is 1.42 cm.  Right heart cath confirms PA pressure 68/32.  With his none obstructive coronary disease and severe valve deterioration-without signs symptoms or culture suggesting endocarditis he should be considered for valve in valve TAVR.   I have seen the patient reviewed his findings and discussed pros and cons of open valve procedure versus TAVR.  He is agreeable to consider valve in valve TAVR.  Cardiology service to arrange appropriate preprocedure studies.  Grace Isaac MD      [address] [place] [address] Office [phone]

## 2019-12-12 NOTE — Progress Notes (Addendum)
Progress Note  Patient Name: Douglas Hart Date of Encounter: 12/12/2019  Primary Cardiologist: [name], MD   Subjective   Feeling well this morning.   Inpatient Medications    Scheduled Meds: . aspirin  81 mg Oral Daily  . enoxaparin (LOVENOX) injection  40 mg Subcutaneous Q24H  . furosemide  40 mg Intravenous Daily  . Living Better with Heart Failure Book   Does not apply Once  . metoprolol succinate  50 mg Oral Daily  . sodium chloride flush  3 mL Intravenous Q12H  . sodium chloride flush  3 mL Intravenous Q12H  . sodium chloride flush  3 mL Intravenous Q12H   Continuous Infusions: . sodium chloride    . sodium chloride     PRN Meds: sodium chloride, sodium chloride, acetaminophen, ondansetron (ZOFRAN) IV, sodium chloride flush, sodium chloride flush   Vital Signs    Vitals:   12/11/19 1738 12/11/19 2022 12/11/19 2101 12/12/19 0700  BP: (!) 139/58 121/60  (!) 137/52  Pulse: 77 77  72  Resp:  16  15  Temp:   98.3 F (36.8 C) 97.9 F (36.6 C)  TempSrc:   Oral Oral  SpO2: 93% 97%  96%  Weight:    108.6 kg  Height:        Intake/Output Summary (Last 24 hours) at 12/12/2019 1053 Last data filed at 12/12/2019 0700 Gross per 24 hour  Intake 480 ml  Output 2875 ml  Net -2395 ml   Last 3 Weights 12/12/2019 12/11/2019 12/10/2019  Weight (lbs) 239 lb 6.4 oz 240 lb 9.6 oz 242 lb 1.6 oz  Weight (kg) 108.591 kg 109.135 kg 109.816 kg      Telemetry    SR - Personally Reviewed  ECG    No new tracing this morning.   Physical Exam  Pleasant older WM, sitting up in the chair.  GEN: No acute distress.   Neck: No JVD Cardiac: RRR, + systolic/diastolic murmur, no rubs, or gallops.  Respiratory: Clear to auscultation bilaterally. GI: Soft, nontender, non-distended  MS: No edema; No deformity. Right radial cath site stable.  Neuro:  Nonfocal  Psych: Normal affect   Labs    High Sensitivity Troponin:  No results for input(s): TROPONINIHS in the last 720  hours.    Chemistry Recent Labs  Lab 12/09/19 2053 12/09/19 2053 12/10/19 0435 12/10/19 0435 12/11/19 0215 12/11/19 1605 12/12/19 0403  NA 140   < > 142   < > 136 144  143 143  K 3.8   < > 3.5   < > 4.0 3.5  3.5 3.8  CL 108   < > 108  --  108  --  107  CO2 21*   < > 24  --  21*  --  24  GLUCOSE 124*   < > 100*  --  93  --  96  BUN 19   < > 19  --  24*  --  25*  CREATININE 1.13   < > 1.24  --  1.19  --  1.22  CALCIUM 9.4   < > 9.2  --  9.1  --  9.2  PROT 6.5  --   --   --   --   --   --   ALBUMIN 3.7  --   --   --   --   --   --   AST 53*  --   --   --   --   --   --  ALT 62*  --   --   --   --   --   --   ALKPHOS 67  --   --   --   --   --   --   BILITOT 1.1  --   --   --   --   --   --   GFRNONAA >60   < > 57*  --  59*  --  58*  GFRAA >60   < > >60  --  >60  --  >60  ANIONGAP 11   < > 10  --  7  --  12   < > = values in this interval not displayed.     Hematology Recent Labs  Lab 12/10/19 0435 12/10/19 0435 12/11/19 0215 12/11/19 1605 12/12/19 0403  WBC 5.6  --  5.7  --  6.3  RBC 3.43*  --  3.67*  --  3.56*  HGB 11.0*   < > 11.8* 10.9*  10.9* 11.4*  HCT 33.0*   < > 35.8* 32.0*  32.0* 34.6*  MCV 96.2  --  97.5  --  97.2  MCH 32.1  --  32.2  --  32.0  MCHC 33.3  --  33.0  --  32.9  RDW 14.6  --  14.8  --  14.6  PLT 118*  --  131*  --  129*   < > = values in this interval not displayed.    BNP Recent Labs  Lab 12/09/19 2053  BNP 988.3*     DDimer No results for input(s): DDIMER in the last 168 hours.   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  Widely patent left main, RCA, with 50 to 60% ostial circumflex and 50 to 60% mid LAD stenoses.  Severe bioprosthetic valve regurgitation. RECOMMENDATIONS:  We will speak with Dr. 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  Currently out of heart failure and feeling back to baseline.  DG CHEST PORT 1 VIEW  Result Date: 12/11/2019 CLINICAL DATA:  Shortness of breath and decreased urinary output. Prior aortic valve prosthesis. EXAM: PORTABLE CHEST 1 VIEW COMPARISON:  12/10/2019 FINDINGS: Reversal or ptotic projection. Mild cardiomegaly. Pulmonary interstitial accentuation. Indistinct pulmonary vasculature. The appearance suggests interstitial pulmonary edema. The diaphragms are somewhat obscured due to the reverse lordotic projection. Aortic valve prosthesis noted. IMPRESSION: Mild cardiomegaly with interstitial pulmonary edema, likely from congestive heart failure. Electronically Signed   By: Van Clines M.D.   On: 12/11/2019 19:00   ECHOCARDIOGRAM COMPLETE  Result Date: 12/10/2019    ECHOCARDIOGRAM REPORT   Patient Name:   Douglas Hart Date of Exam: 12/10/2019 Medical Rec #:  [id number]     Height:       73.0 in Accession #:    [id number]    Weight:       242.1 lb Date of Birth:  Jan 28, 1946     BSA:          2.334 m Patient Age:    74 years      BP:           140/46 mmHg Patient Gender: M             HR:           77 bpm. Exam Location:  Inpatient Procedure: 2D Echo, Color Doppler  and Cardiac Doppler Indications:    Aortic Valve Disorder i35.9  History:        Patient has prior history of Echocardiogram examinations, most                 recent 11/14/2019. CHF, Arrythmias:Atrial Flutter; Risk                 Factors:Hypertension and Dyslipidemia. 2008 Bioprosthetic Aortic                 Valve Replacement.                 Aortic Valve: Unknown bioprosthetic valve is present in the                 aortic position.  Sonographer:    Raquel Sarna Senior RDCS Referring Phys: [place]  1. Left ventricular ejection fraction, by estimation, is 65 to 70%. The left ventricle has normal function. The left ventricle has no regional wall motion abnormalities. There is mild left ventricular  hypertrophy. Left ventricular diastolic function could not be evaluated.  2. Right ventricular systolic function is normal. The right ventricular size is normal. There is mildly elevated pulmonary artery systolic pressure. The estimated right ventricular systolic pressure is 99991111 mmHg.  3. Left atrial size was severely dilated.  4. The mitral valve is abnormal. Trivial mitral valve regurgitation.  5. The tricuspid valve is abnormal.  6. Valve leaflets are thickened with reduced mobility - the cusp in the area of the left coronary artery is not well-visualized and may be immobile. The aortic valve has been repaired/replaced. Aortic valve regurgitation is severe. Mild to moderate aortic valve stenosis. There is a Unknown bioprosthetic valve present in the aortic position. Aortic regurgitation PHT measures 140 msec. Aortic valve area, by VTI measures 1.42 cm. Aortic valve mean gradient measures 26.0 mmHg. Aortic valve Vmax measures 3.27 m/s.  7. Aortic dilatation noted. 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  8. The inferior vena cava is dilated in size with <50% respiratory variability, suggesting right atrial pressure of 15 mmHg. 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 Recommend TEE to further evalute and to more definitively evaluate for endocarditis - this could not be excluded on the basis of this study. FINDINGS  Left Ventricle: Left ventricular ejection fraction, by estimation, is 65 to 70%. The left ventricle has normal function. The left ventricle has no regional wall motion abnormalities. The left ventricular internal cavity size was normal in size. There is  mild left ventricular hypertrophy. Left ventricular diastolic function could not be evaluated due to atrial fibrillation. Left ventricular diastolic function could not be evaluated. Right Ventricle: The right ventricular size is normal. No  increase in right ventricular wall thickness. Right ventricular systolic function is normal. There is mildly elevated pulmonary artery systolic pressure. The tricuspid regurgitant velocity is 2.48  m/s, and with an assumed right atrial pressure of 15 mmHg, the estimated right ventricular systolic pressure is 99991111 mmHg. Left Atrium: Left atrial size was severely dilated. Right Atrium: Right atrial size was normal in size. Pericardium: There is no evidence of pericardial effusion. Mitral Valve: The mitral valve is abnormal. There is mild thickening of the mitral valve leaflet(s). Trivial mitral valve regurgitation. Tricuspid Valve: The tricuspid valve is abnormal. Tricuspid valve regurgitation is trivial. Aortic Valve: Valve leaflets are thickened with reduced mobility - the cusp in the area of the  left coronary artery is not well-visualized and may be immobile. The aortic valve has been repaired/replaced. Aortic valve regurgitation is severe. Aortic regurgitation PHT measures 140 msec. Mild to moderate aortic stenosis is present. Aortic valve mean gradient measures 26.0 mmHg. Aortic valve peak gradient measures 42.8 mmHg. Aortic valve area, by VTI measures 1.42 cm. There is a Unknown bioprosthetic valve present in the aortic position. Pulmonic Valve: The pulmonic valve was normal in structure. Pulmonic valve regurgitation is not visualized. Aorta: Aortic dilatation noted. 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 Venous: The inferior vena cava is dilated in size with less than 50% respiratory variability, suggesting right atrial pressure of 15 mmHg. IAS/Shunts: No atrial level shunt detected by color flow Doppler.  LEFT VENTRICLE PLAX 2D LVIDd:         4.90 cm  Diastology LVIDs:         3.00 cm  LV e' lateral:   8.16 cm/s LV PW:         1.10 cm  LV E/e' lateral: 13.7 LV IVS:        1.20 cm  LV e' medial:    5.66 cm/s LVOT diam:     2.10 cm  LV E/e' medial:  19.8 LV SV:         96  LV SV Index:   41 LVOT Area:     3.46 cm  RIGHT VENTRICLE RV S prime:     11.30 cm/s TAPSE (M-mode): 1.8 cm LEFT ATRIUM              Index       RIGHT ATRIUM           Index LA diam:        3.90 cm  1.67 cm/m  RA Area:     21.00 cm LA Vol (A2C):   141.0 ml 60.42 ml/m RA Volume:   62.00 ml  26.57 ml/m LA Vol (A4C):   133.0 ml 56.99 ml/m LA Biplane Vol: 143.0 ml 61.28 ml/m  AORTIC VALVE AV Area (Vmax):    1.29 cm AV Area (Vmean):   1.31 cm AV Area (VTI):     1.42 cm AV Vmax:           327.00 cm/s AV Vmean:          237.000 cm/s AV VTI:            0.678 m AV Peak Grad:      42.8 mmHg AV Mean Grad:      26.0 mmHg LVOT Vmax:         122.00 cm/s LVOT Vmean:        89.600 cm/s LVOT VTI:          0.278 m LVOT/AV VTI ratio: 0.41 AI PHT:            140 msec  AORTA Ao Root diam: 2.70 cm Ao Asc diam:  4.15 cm MITRAL VALVE                TRICUSPID VALVE MV Area (PHT): 3.42 cm     TR Peak grad:   24.6 mmHg MV Decel Time: 222 msec     TR Vmax:        248.00 cm/s MV E velocity: 112.00 cm/s                             SHUNTS  Systemic VTI:  0.28 m                             Systemic Diam: 2.10 cm Lyman Bishop MD Electronically signed by Lyman Bishop MD Signature Date/Time: 12/10/2019/1:52:09 PM    Final     Cardiac Studies   Cath: 12/11/19   Severe pulmonary hypertension secondary to acute diastolic heart failure secondary to acute aortic regurgitation.  Widely patent left main, RCA, with 50 to 60% ostial circumflex and 50 to 60% mid LAD stenoses.  Severe bioprosthetic valve regurgitation.  RECOMMENDATIONS:   We will speak with Dr. [name] concerning management of this patient's aortic bioprosthetic dysfunction with severe aortic regurgitation.  Currently out of heart failure and feeling back to baseline.  Diagnostic Dominance: Right    Patient Profile     74 y.o. male with a hx of bioprosthetic aortic valve replacement in 2008 by Dr. [name] atrial  flutter,hypertension, hyperlipidemiaand recent admission [facility] with pneumonia who directly admitted from clinic for acute on chronic combined CHF.  Assessment & Plan    1. Acute on chronic combined CHF: BNP 988. Net - 4.7L. Weight is down. Back on IV lasix this morning. Breathing is stable. Underwent R/LHC noted above with no significant coronary disease.  - check BNP today  - Continue BB  2. S/p bioprosthetic aortic valve replacement: echo noted above with valve area, by VTI measures 1.42 cm. Aortic valve mean gradient measures 26.0 mmHg. Aortic valve Vmax measures 3.27 m/s.  - underwent cath yesterday. Dr. [name] was consulted with recommendations for TAVR. This was discussed with the patient, who is agreeable. TAVR team is aware of the patient and likely to see tomorrow.   3. HTN - Bp stable on current medications.   4. HLD - check lipids  For questions or updates, please contact [place] Please consult [email or web address] for contact info under   Signed, [name], NP  12/12/2019, 10:53 AM    Patient seen and examined. Agree with assessment and plan.  Patient feels better today.  He had experienced a mild increase shortness of breath while lying flat after his procedure yesterday.  I have personally reviewed the cath data and right heart pressure data.  He has severe pulmonary hypertension and prosthetic valve dysfunction with severe aortic insufficiency as well as at least moderate aortic stenosis.  Right heart catheterization demonstrated severe pulmonary hypertension with PA systolic pressure at 68 mm.  He feels improved today.  On exam he appears fairly euvolemic.  I will check a BNP today.  Patient had his AVR card and was able to ascertain data and has a 25 mm [employer] pericardial tissue valve.  Patient has been evaluated by Dr. [name] this morning.  We will try to arrange for the TAVR team to evaluate the patient and order appropriate  preprocedural imaging studies prior to potential valve in valve TAVR.  Blood cultures negative so far for endocarditis but not finalized.   Troy Sine, MD, [facility] 12/12/2019 11:51 AM

## 2019-12-12 NOTE — Consult Note (Addendum)
Cardiology Consultation:   Patient ID: Douglas Hart MRN: [id number]; DOB: 06-14-1946  Admit date: 12/09/2019 Date of Consult: 12/12/2019  Primary Care Provider: Venetia Maxon, Sharon Mt, MD Primary Cardiologist: [name], MD   History of Present Illness:   Douglas Hart is a 74 yo male with history of aortic stenosis s/p bioprosthetic AVR in 2008, post-op atrial flutter, HTN, hyperlipidemia, asthma, sleep apnea on CPAP who has had recent progressive dyspnea on exertion and lower extremity edema. He notes feeling poorly for the past 4-6 weeks. He was admitted to [facility] on March 4,2021 with respiratory distress and was found to have bilateral pleural effusions. Thoracentesis performed on both lungs. He was also treated for a possible pneumonia. His dyspnea continued following discharge from the hospital. He was seen by Dr. Tamala Julian on 12/09/19 and was admitted for acute CHF. Echo 12/10/19 with LVEF=65-70%, mild LVH. The bioprosthetic AVR was found to be degenerative with severe aortic insufficiency and moderate aortic stenosis (mean gradient 26 mmHg, peak gradient 42.8 mmHg). Cardiac cath 12/11/19 with moderate non-obstructive CAD. (50-60% ostial Circumflex, 50-60% mid LAD stenosis). He has diuresed well with symptomatic improvement but still with lower extremity edema and orthopnea at night. The structural heart team is asked to evaluate for possible TAVR. His bioprosthetic AVR is an [place] 3000 pericardial tissue valve-serial number [id number]  79mm.  He tells me today that he feels much better after diuresis. He continues to have some lower extremity edema. No chest pain. He has had trouble sleeping at night. No dizziness, near syncope or syncope. He lives in [place] with his wife. He has partial dentures but sees a dentist every six months. He has no active dental issues. He retired from [employer]. 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    Past  Medical History:  Diagnosis Date  . Aortic valve disease   . Asthma   . CHF (congestive heart failure) ([place])   . Fatigue   . Hearing loss   . Hypertension   . Knee pain   . Sleep apnea    uses cpap    Past Surgical History:  Procedure Laterality Date  . CARDIAC SURGERY    . RIGHT HEART CATH AND CORONARY/GRAFT ANGIOGRAPHY N/A 12/11/2019   Procedure: RIGHT HEART CATH AND CORONARY/GRAFT ANGIOGRAPHY;  Surgeon: Belva Crome, MD;  Location: [facility];  Service: Cardiovascular;  Laterality: N/A;     Home Medications:  Prior to Admission medications   Medication Sig Start Date End Date Taking? Authorizing Provider  amLODipine (NORVASC) 5 MG tablet Take 1 tablet (5 mg total) by mouth daily. 04/11/16   Belva Crome, MD  amoxicillin (AMOXIL) 500 MG capsule Take 2,000 mg by mouth See admin instructions. Take 2000 mg by mouth 1 hour prior to dental appointments    [provider]  Ascorbic Acid (VITAMIN C) 1000 MG tablet Take 1,000 mg by mouth daily.    [provider]  furosemide (LASIX) 40 MG tablet Take 1 tablet (40 mg total) by mouth daily. 04/11/16   Belva Crome, MD  lisinopril (PRINIVIL,ZESTRIL) 40 MG tablet Take 1 tablet (40 mg total) by mouth daily. 04/11/16   Belva Crome, MD  metoprolol succinate (TOPROL-XL) 50 MG 24 hr tablet TAKE 1 TABLET BY MOUTH ONCE DAILY OR IMMEDIATELY FOLLOWING A MEAL Patient taking differently: Take 50 mg by mouth daily.  09/19/19   Belva Crome, MD  Multiple Vitamin (MULTIVITAMIN WITH MINERALS) TABS tablet Take 1 tablet by mouth daily.    [provider]  potassium chloride SA (K-DUR,KLOR-CON) 20 MEQ tablet Take 1 tablet (20 mEq total) by mouth daily. 04/11/16   Belva Crome, MD  zinc gluconate 50 MG tablet Take 50 mg by mouth daily.    [provider]    Inpatient Medications: Scheduled Meds: . aspirin  81 mg Oral Daily  . enoxaparin (LOVENOX) injection  40 mg Subcutaneous Q24H  . furosemide  40 mg  Intravenous Daily  . Living Better with Heart Failure Book   Does not apply Once  . metoprolol succinate  50 mg Oral Daily  . sodium chloride flush  3 mL Intravenous Q12H  . sodium chloride flush  3 mL Intravenous Q12H  . sodium chloride flush  3 mL Intravenous Q12H   Continuous Infusions: . sodium chloride    . sodium chloride     PRN Meds: sodium chloride, sodium chloride, acetaminophen, ondansetron (ZOFRAN) IV, sodium chloride flush, sodium chloride flush  Allergies:    Allergies  Allergen Reactions  . Azithromycin     Can tolerate in small doses, eye burning    Social History:   Social History   Socioeconomic History  . Marital status: Married    Spouse name: Not on file  . Number of children: Not on file  . Years of education: Not on file  . Highest education level: Not on file  Occupational History  . Not on file  Tobacco Use  . Smoking status: Never Smoker  . Smokeless tobacco: Never Used  Substance and Sexual Activity  . Alcohol use: No  . Drug use: No  . Sexual activity: Not on file  Other Topics Concern  . Not on file  Social History Narrative  . Not on file   Social Determinants of Health   Financial Resource Strain:   . Difficulty of Paying Living Expenses:   Food Insecurity:   . Worried About Charity fundraiser in the Last Year:   . Arboriculturist in the Last Year:   Transportation Needs:   . Film/video editor (Medical):   [name] Lack of Transportation (Non-Medical):   Physical Activity:   . Days of Exercise per Week:   . Minutes of Exercise per Session:   Stress:   . Feeling of Stress :   Social Connections:   . Frequency of Communication with Friends and Family:   . Frequency of Social Gatherings with Friends and Family:   . Attends Religious Services:   . Active Member of Clubs or Organizations:   . Attends Archivist Meetings:   [name] Marital Status:   Intimate Partner Violence:   . Fear of Current or Ex-Partner:   .  Emotionally Abused:   [name] Physically Abused:   . Sexually Abused:     Family History:   Family History  Problem Relation Age of Onset  . Alzheimer's disease Mother   . Heart disease Father 89       CABG  . Hypertension Father   . Other Sister        Knee problems  . Heart disease Brother        STENTS PLACED  . Leukemia Brother 89  . Other Daughter        [place]  . Other Daughter        [place]  . Hypertension Brother   . Heart attack  Neg Hx   . Stroke Neg Hx      ROS:  Please see the history of present illness.  All other ROS reviewed and negative.     Physical Exam/Data:   Vitals:   12/11/19 2022 12/11/19 2101 12/12/19 0700 12/12/19 1608  BP: 121/60  (!) 137/52   Pulse: 77  72 74  Resp: 16  15   Temp:  98.3 F (36.8 C) 97.9 F (36.6 C) 97.7 F (36.5 C)  TempSrc:  Oral Oral Oral  SpO2: 97%  96% 96%  Weight:   108.6 kg   Height:        Intake/Output Summary (Last 24 hours) at 12/12/2019 1621 Last data filed at 12/12/2019 1608 Gross per 24 hour  Intake 720 ml  Output 2900 ml  Net -2180 ml   Last 3 Weights 12/12/2019 12/11/2019 12/10/2019  Weight (lbs) 239 lb 6.4 oz 240 lb 9.6 oz 242 lb 1.6 oz  Weight (kg) 108.591 kg 109.135 kg 109.816 kg     Body mass index is 31.59 kg/m.  General:  Well nourished, well developed, in no acute distress HEENT: normal Lymph: no adenopathy Neck: no JVD Endocrine:  No thryomegaly Vascular: No carotid bruits; FA pulses 2+ bilaterally without bruits  Cardiac:  normal S1, S2; RRR; Loud, harsh systolic and diastolic murmur.  Lungs:  clear to auscultation bilaterally, no wheezing, rhonchi or rales  Abd: soft, nontender, no hepatomegaly  Ext: no edema Musculoskeletal:  No deformities, BUE and BLE strength normal and equal Skin: warm and dry  Neuro:  CNs 2-12 intact, no focal abnormalities noted Psych:  Normal affect   EKG:  The EKG was personally reviewed and demonstrates: sinus rhythm with LAE.  Telemetry:  Telemetry was  personally reviewed and demonstrates:  Sinus rhythm  Laboratory Data:  High Sensitivity Troponin:  No results for input(s): TROPONINIHS in the last 720 hours.   Chemistry Recent Labs  Lab 12/10/19 0435 12/10/19 0435 12/11/19 0215 12/11/19 1605 12/12/19 0403  NA 142   < > 136 144  143 143  K 3.5   < > 4.0 3.5  3.5 3.8  CL 108  --  108  --  107  CO2 24  --  21*  --  24  GLUCOSE 100*  --  93  --  96  BUN 19  --  24*  --  25*  CREATININE 1.24  --  1.19  --  1.22  CALCIUM 9.2  --  9.1  --  9.2  GFRNONAA 57*  --  59*  --  58*  GFRAA >60  --  >60  --  >60  ANIONGAP 10  --  7  --  12   < > = values in this interval not displayed.    Recent Labs  Lab 12/09/19 2053  PROT 6.5  ALBUMIN 3.7  AST 53*  ALT 62*  ALKPHOS 67  BILITOT 1.1   Hematology Recent Labs  Lab 12/10/19 0435 12/10/19 0435 12/11/19 0215 12/11/19 1605 12/12/19 0403  WBC 5.6  --  5.7  --  6.3  RBC 3.43*  --  3.67*  --  3.56*  HGB 11.0*   < > 11.8* 10.9*  10.9* 11.4*  HCT 33.0*   < > 35.8* 32.0*  32.0* 34.6*  MCV 96.2  --  97.5  --  97.2  MCH 32.1  --  32.2  --  32.0  MCHC 33.3  --  33.0  --  32.9  RDW 14.6  --  14.8  --  14.6  PLT 118*  --  131*  --  129*   < > = values in this interval not displayed.   BNP Recent Labs  Lab 12/09/19 2053 12/12/19 1038  BNP 988.3* 884.0*    DDimer No results for input(s): DDIMER in the last 168 hours.   Radiology/Studies:  DG Chest 2 View  Result Date: 12/10/2019 CLINICAL DATA:  Chest pain and shortness of breath with fluid retention EXAM: CHEST - 2 VIEW COMPARISON:  10/20/2006 FINDINGS: Stable borderline cardiomegaly with symmetric interstitial opacity and mild fissure thickening. A few Kerley lines are present. Aortic valve replacement. Trace pleural fluid. IMPRESSION: Mild CHF. Electronically Signed   By: Monte Fantasia M.D.   On: 12/10/2019 08:28   CARDIAC CATHETERIZATION  Result Date: 12/11/2019  Severe pulmonary hypertension secondary to acute  diastolic heart failure secondary to acute aortic regurgitation.  Widely patent left main, RCA, with 50 to 60% ostial circumflex and 50 to 60% mid LAD stenoses.  Severe bioprosthetic valve regurgitation. RECOMMENDATIONS:  We will speak with Dr. [name] concerning management of this patient's aortic bioprosthetic dysfunction with severe aortic regurgitation.  Currently out of heart failure and feeling back to baseline.  DG CHEST PORT 1 VIEW  Result Date: 12/11/2019 CLINICAL DATA:  Shortness of breath and decreased urinary output. Prior aortic valve prosthesis. EXAM: PORTABLE CHEST 1 VIEW COMPARISON:  12/10/2019 FINDINGS: Reversal or ptotic projection. Mild cardiomegaly. Pulmonary interstitial accentuation. Indistinct pulmonary vasculature. The appearance suggests interstitial pulmonary edema. The diaphragms are somewhat obscured due to the reverse lordotic projection. Aortic valve prosthesis noted. IMPRESSION: Mild cardiomegaly with interstitial pulmonary edema, likely from congestive heart failure. Electronically Signed   By: Van Clines M.D.   On: 12/11/2019 19:00   ECHOCARDIOGRAM COMPLETE  Result Date: 12/10/2019    ECHOCARDIOGRAM REPORT   Patient Name:   Douglas Hart Date of Exam: 12/10/2019 Medical Rec #:  [id number]     Height:       73.0 in Accession #:    [id number]    Weight:       242.1 lb Date of Birth:  1946/05/13     BSA:          2.334 m Patient Age:    74 years      BP:           140/46 mmHg Patient Gender: M             HR:           77 bpm. Exam Location:  Inpatient Procedure: 2D Echo, Color Doppler and Cardiac Doppler Indications:    Aortic Valve Disorder i35.9  History:        Patient has prior history of Echocardiogram examinations, most                 recent 11/14/2019. CHF, Arrythmias:Atrial Flutter; Risk                 Factors:Hypertension and Dyslipidemia. 2008 Bioprosthetic Aortic                 Valve Replacement.                 Aortic Valve: Unknown bioprosthetic valve  is present in the                 aortic position.  Sonographer:    Raquel Sarna Senior RDCS Referring Phys: [place]  1. Left ventricular ejection fraction, by estimation, is 65 to 70%. The left ventricle has normal function. The left ventricle has no regional wall motion abnormalities. There is mild left ventricular hypertrophy. Left ventricular diastolic function could not be evaluated.  2. Right ventricular systolic function is normal. The right ventricular size is normal. There is mildly elevated pulmonary artery systolic pressure. The estimated right ventricular systolic pressure is 99991111 mmHg.  3. Left atrial size was severely dilated.  4. The mitral valve is abnormal. Trivial mitral valve regurgitation.  5. The tricuspid valve is abnormal.  6. Valve leaflets are thickened with reduced mobility - the cusp in the area of the left coronary artery is not well-visualized and may be immobile. The aortic valve has been repaired/replaced. Aortic valve regurgitation is severe. Mild to moderate aortic valve stenosis. There is a Unknown bioprosthetic valve present in the aortic position. Aortic regurgitation PHT measures 140 msec. Aortic valve area, by VTI measures 1.42 cm. Aortic valve mean gradient measures 26.0 mmHg. Aortic valve Vmax measures 3.27 m/s.  7. Aortic dilatation noted. 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  8. The inferior vena cava is dilated in size with <50% respiratory variability, suggesting right atrial pressure of 15 mmHg. 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 Recommend TEE to further evalute and to more definitively evaluate for endocarditis - this could not be excluded on the basis of this study. FINDINGS  Left Ventricle: Left ventricular ejection fraction, by estimation, is 65 to 70%. The left ventricle has normal function. The left ventricle has no regional wall motion  abnormalities. The left ventricular internal cavity size was normal in size. There is  mild left ventricular hypertrophy. Left ventricular diastolic function could not be evaluated due to atrial fibrillation. Left ventricular diastolic function could not be evaluated. Right Ventricle: The right ventricular size is normal. No increase in right ventricular wall thickness. Right ventricular systolic function is normal. There is mildly elevated pulmonary artery systolic pressure. The tricuspid regurgitant velocity is 2.48  m/s, and with an assumed right atrial pressure of 15 mmHg, the estimated right ventricular systolic pressure is 99991111 mmHg. Left Atrium: Left atrial size was severely dilated. Right Atrium: Right atrial size was normal in size. Pericardium: There is no evidence of pericardial effusion. Mitral Valve: The mitral valve is abnormal. There is mild thickening of the mitral valve leaflet(s). Trivial mitral valve regurgitation. Tricuspid Valve: The tricuspid valve is abnormal. Tricuspid valve regurgitation is trivial. Aortic Valve: Valve leaflets are thickened with reduced mobility - the cusp in the area of the left coronary artery is not well-visualized and may be immobile. The aortic valve has been repaired/replaced. Aortic valve regurgitation is severe. Aortic regurgitation PHT measures 140 msec. Mild to moderate aortic stenosis is present. Aortic valve mean gradient measures 26.0 mmHg. Aortic valve peak gradient measures 42.8 mmHg. Aortic valve area, by VTI measures 1.42 cm. There is a Unknown bioprosthetic valve present in the aortic position. Pulmonic Valve: The pulmonic valve was normal in structure. Pulmonic valve regurgitation is not visualized. Aorta: Aortic dilatation noted. 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 Venous: The inferior vena cava is dilated in size with less than 50% respiratory variability, suggesting right atrial pressure of 15 mmHg. IAS/Shunts:  No atrial level shunt detected by color flow Doppler.  LEFT VENTRICLE PLAX 2D LVIDd:         4.90 cm  Diastology  LVIDs:         3.00 cm  LV e' lateral:   8.16 cm/s LV PW:         1.10 cm  LV E/e' lateral: 13.7 LV IVS:        1.20 cm  LV e' medial:    5.66 cm/s LVOT diam:     2.10 cm  LV E/e' medial:  19.8 LV SV:         96 LV SV Index:   41 LVOT Area:     3.46 cm  RIGHT VENTRICLE RV S prime:     11.30 cm/s TAPSE (M-mode): 1.8 cm LEFT ATRIUM              Index       RIGHT ATRIUM           Index LA diam:        3.90 cm  1.67 cm/m  RA Area:     21.00 cm LA Vol (A2C):   141.0 ml 60.42 ml/m RA Volume:   62.00 ml  26.57 ml/m LA Vol (A4C):   133.0 ml 56.99 ml/m LA Biplane Vol: 143.0 ml 61.28 ml/m  AORTIC VALVE AV Area (Vmax):    1.29 cm AV Area (Vmean):   1.31 cm AV Area (VTI):     1.42 cm AV Vmax:           327.00 cm/s AV Vmean:          237.000 cm/s AV VTI:            0.678 m AV Peak Grad:      42.8 mmHg AV Mean Grad:      26.0 mmHg LVOT Vmax:         122.00 cm/s LVOT Vmean:        89.600 cm/s LVOT VTI:          0.278 m LVOT/AV VTI ratio: 0.41 AI PHT:            140 msec  AORTA Ao Root diam: 2.70 cm Ao Asc diam:  4.15 cm MITRAL VALVE                TRICUSPID VALVE MV Area (PHT): 3.42 cm     TR Peak grad:   24.6 mmHg MV Decel Time: 222 msec     TR Vmax:        248.00 cm/s MV E velocity: 112.00 cm/s                             SHUNTS                             Systemic VTI:  0.28 m                             Systemic Diam: 2.10 cm Lyman Bishop MD Electronically signed by Lyman Bishop MD Signature Date/Time: 12/10/2019/1:52:09 PM    Final    _____________________    STS RISK SCORE Procedure: Isolated AVR  Risk of Mortality: 2.401% Renal Failure: 3.353% Permanent Stroke: 1.121% Prolonged Ventilation: 9.133% DSW Infection: 0.160% Reoperation: 5.711% Morbidity or Mortality: 14.422% Short Length of Stay: 37.049% Long Length of Stay: 5.481%   ____________________   [facility] Cardiomyopathy  Questionnaire  KCCQ-12 12/13/2019  1 a. Ability to shower/bathe Slightly limited  1  b. Ability to walk 1 block Extremely limited  1 c. Ability to hurry/jog Extremely limited  2. Edema feet/ankles/legs Every morning  3. Limited by fatigue All of the time  4. Limited by dyspnea All of the time  5. Sitting up / on 3+ pillows Every night  6. Limited enjoyment of life Extremely limited  7. Rest of life w/ symptoms Not at all satisfied  8 a. Participation in hobbies Severely limited  8 b. Participation in chores Severely limited  8 c. Visiting family/friends Severely limited      Assessment and Plan:   1. 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 AVR is indicated. He has been seen by Dr. [name] and is not felt to be a candidate for surgical AVR. I think he would be a good candidate for TAVR with valve in valve procedure. He has completed his cardiac cath and there is no obstructive CAD. I have personally reviewed the cath films. I have personally reviewed his echo images. There is severe AI and moderate AS. I have reviewed bioprosthetic AVR failure with the patient. We have discussed the limitations of medical therapy and the poor prognosis associated with symptomatic aortic insufficiency and aortic stenosis. We have reviewed potential treatment options, including palliative medical therapy, re-do conventional surgical aortic valve replacement, and transcatheter aortic valve replacement. We discussed treatment options in the context of the patient's specific comorbid medical conditions.   He would like to proceed with planning for valve in valve TAVR. We will arrange a gated cardiac CTA and chest/abd/pelvis CTA tomorrow. He will need carotid artery dopplers and a PT assessment. Risks and benefits of the valve procedure reviewed with the patient. He agrees to  proceed. We will tentatively plan for TAVR on Tuesday April 6th. He does not need a dental consult.   2. Acute on chronic diastolic CHF: presenting with NYHA class III symptoms. He remains volume overloaded. I would continue diuresis with IV Lasix. He will remain inpatient until his TAVR on Tuesday of next week.    For questions or updates, please contact [place] Please consult [email or web address] for contact info under     Signed, Lauree Chandler, MD  12/12/2019 4:21 PM

## 2019-12-13 ENCOUNTER — Inpatient Hospital Stay (HOSPITAL_COMMUNITY): Payer: BC Managed Care – PPO

## 2019-12-13 ENCOUNTER — Encounter (HOSPITAL_COMMUNITY): Payer: Self-pay | Admitting: Interventional Cardiology

## 2019-12-13 DIAGNOSIS — Z01810 Encounter for preprocedural cardiovascular examination: Secondary | ICD-10-CM

## 2019-12-13 DIAGNOSIS — I350 Nonrheumatic aortic (valve) stenosis: Secondary | ICD-10-CM | POA: Diagnosis not present

## 2019-12-13 DIAGNOSIS — T8209XA Other mechanical complication of heart valve prosthesis, initial encounter: Secondary | ICD-10-CM | POA: Diagnosis present

## 2019-12-13 DIAGNOSIS — Z952 Presence of prosthetic heart valve: Secondary | ICD-10-CM

## 2019-12-13 LAB — BASIC METABOLIC PANEL
Anion gap: 9 (ref 5–15)
BUN: 26 mg/dL — ABNORMAL HIGH (ref 8–23)
CO2: 25 mmol/L (ref 22–32)
Calcium: 9.5 mg/dL (ref 8.9–10.3)
Chloride: 108 mmol/L (ref 98–111)
Creatinine, Ser: 1.43 mg/dL — ABNORMAL HIGH (ref 0.61–1.24)
GFR calc Af Amer: 55 mL/min — ABNORMAL LOW (ref >60)
GFR calc non Af Amer: 48 mL/min — ABNORMAL LOW (ref >60)
Glucose, Bld: 102 mg/dL — ABNORMAL HIGH (ref 70–99)
Potassium: 3.9 mmol/L (ref 3.5–5.1)
Sodium: 142 mmol/L (ref 135–145)

## 2019-12-13 LAB — LIPID PANEL
Cholesterol: 165 mg/dL (ref 0–200)
HDL: 40 mg/dL — ABNORMAL LOW (ref >40)
LDL Cholesterol: 105 mg/dL — ABNORMAL HIGH (ref 0–99)
Total CHOL/HDL Ratio: 4.1 RATIO
Triglycerides: 98 mg/dL (ref <150)
VLDL: 20 mg/dL (ref 0–40)

## 2019-12-13 MED ORDER — METOPROLOL TARTRATE 50 MG PO TABS
50.0000 mg | ORAL_TABLET | Freq: Once | ORAL | Status: AC
  Administered 2019-12-13: 13:00:00 50 mg via ORAL
  Filled 2019-12-13: qty 1

## 2019-12-13 MED ORDER — ATORVASTATIN CALCIUM 40 MG PO TABS
40.0000 mg | ORAL_TABLET | Freq: Every day | ORAL | Status: DC
  Administered 2019-12-13 – 2019-12-16 (×4): 40 mg via ORAL
  Filled 2019-12-13 (×4): qty 1

## 2019-12-13 MED ORDER — METOPROLOL TARTRATE 100 MG PO TABS
100.0000 mg | ORAL_TABLET | Freq: Once | ORAL | Status: AC
  Administered 2019-12-13: 100 mg via ORAL
  Filled 2019-12-13: qty 1

## 2019-12-13 MED ORDER — IOHEXOL 350 MG/ML SOLN
100.0000 mL | Freq: Once | INTRAVENOUS | Status: AC | PRN
  Administered 2019-12-13: 18:00:00 100 mL via INTRAVENOUS

## 2019-12-13 MED ORDER — METOPROLOL TARTRATE 50 MG PO TABS
50.0000 mg | ORAL_TABLET | Freq: Once | ORAL | Status: DC
  Filled 2019-12-13: qty 1

## 2019-12-13 NOTE — Therapy (Signed)
12/13/2019 PT TAVR Pre-Assessment  HPI: 74 y.o. 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 Presented to ED w/ c/o CHF and cardiac valve problems. 3/4- 11/17/19 hospitalized w/ respiratory distress. Pt found to have acute on chronic CHF, orthopnea, PND, BLE swelling, and bioprosthetic aortic valve dysfunction. Also admitted for IV diuresis. s/p RIGHT/LEFT HEART CATH AND CORONARY ANGIOGRAPHY 3/31.  Clinical Impression Statement: Pt is a 74 y.o male being assessed for pre-TAVR.  Pt reports symptoms of none with activity (attempts to have conversations and says hi to everyone he passes in hall needing reminder of test being completed).  Pt has 5/5 strength, WFL ROM, and fair+ balance.  Pt ambulated  682 ft during the 6 minute walk test requiring 0 rest breaks with max HR of 97 bpm , lowest O2 sat 95%, BP 142/62 (86) during mobility.  5 meter walk test produced an average gait speed of 9.82 sec which indicates.  RPE was 9 -very light and dyspnea was 2- slight during mobility.  Pt was limited by nothing.  Pt's frailty rating was 3 - managing well which is considered good for age.  Pt would benefit from continued PT in the acute care setting due decreased activity tolerance and c/o limitations in L knee.  General UE/LE Strength and ROM:  Strength (0-5/5) ROM (limited/full)  R UE   5/5   WFL  L UE   5/5   WFL  R LE   5/5   WFL  L LE   5/5   WFL    6 Minute Walk Test:   Total Distance Walked: 682 ft.    Did the pt need a rest break? no If yes, why? Pain: denied Fatigue:very little; Dyspnea/O2 saturations: no  Comments:  Pt did very well with ambulation, needed continued reminders of test as was noted to be cheerfully starting conversations and greeting everyone in hall.   Pre-Test Post-Test  BP 131/56 (78)     142/62 (86)  HR  78     91  O2 saturations (indicated RA or L/min [place])  96   95  Modified Borg Dyspnea Scale (0  none-10 maximal)  0  2  RPE (6 very light-17 very hard)  6   9  Comments:  5 Meter Walk Test:  Trial 1 11.52 seconds  Trial 2 8.59 seconds  Trial 3 9.35 seconds  3 Trial Average/Gait Speed 9.82 seconds/1.63 ft/sec (<1.8 ft/sec indicates high fall risk)   Comments: Pt did well with 5 meter walks test with average of 9.82 sec and 1.54ft/sec gait indicating low risk of fall. Pt did use RW to complete testing.  Clinical Frailty Scale (1 very fit - 9 terminally ill): 2 (</= 5/12 is considered frail)    Horald Chestnut, PT

## 2019-12-13 NOTE — Progress Notes (Addendum)
Progress Note  Patient Name: Douglas Hart Date of Encounter: 12/13/2019  Primary Cardiologist: [name], MD   Subjective   Feeling well this morning. Seen by TAVR team yesterday.   Inpatient Medications    Scheduled Meds: . aspirin  81 mg Oral Daily  . atorvastatin  40 mg Oral q1800  . enoxaparin (LOVENOX) injection  40 mg Subcutaneous Q24H  . furosemide  40 mg Intravenous Daily  . Living Better with Heart Failure Book   Does not apply Once  . metoprolol succinate  50 mg Oral Daily  . sodium chloride flush  3 mL Intravenous Q12H  . sodium chloride flush  3 mL Intravenous Q12H  . sodium chloride flush  3 mL Intravenous Q12H   Continuous Infusions: . sodium chloride    . sodium chloride     PRN Meds: sodium chloride, sodium chloride, acetaminophen, ondansetron (ZOFRAN) IV, sodium chloride flush, sodium chloride flush   Vital Signs    Vitals:   12/12/19 1608 12/12/19 2055 12/13/19 0313 12/13/19 0807  BP:  139/63 (!) 144/57 (!) 135/50  Pulse: 74 79 76 74  Resp:  19 20   Temp: 97.7 F (36.5 C) 98.1 F (36.7 C) 98.2 F (36.8 C)   TempSrc: Oral Oral Oral   SpO2: 96% 97% 94%   Weight:   108.7 kg   Height:        Intake/Output Summary (Last 24 hours) at 12/13/2019 0918 Last data filed at 12/13/2019 0655 Gross per 24 hour  Intake 243 ml  Output 2025 ml  Net -1782 ml   Last 3 Weights 12/13/2019 12/12/2019 12/11/2019  Weight (lbs) 239 lb 10.2 oz 239 lb 6.4 oz 240 lb 9.6 oz  Weight (kg) 108.7 kg 108.591 kg 109.135 kg      Telemetry    SR - Personally Reviewed  ECG    No new tracing this morning.  Physical Exam  Pleasant older WM, sitting up in chair. GEN: No acute distress.   Neck: No JVD Cardiac: RRR, +systolic/diastolic murmur, no rubs, or gallops.  Respiratory: Clear to auscultation bilaterally. GI: Soft, nontender, non-distended  MS: No edema; No deformity. Neuro:  Nonfocal  Psych: Normal affect   Labs    High Sensitivity Troponin:  No  results for input(s): TROPONINIHS in the last 720 hours.    Chemistry Recent Labs  Lab 12/09/19 2053 12/09/19 2053 12/10/19 0435 12/10/19 0435 12/11/19 0215 12/11/19 1605 12/12/19 0403  NA 140   < > 142   < > 136 144  143 143  K 3.8   < > 3.5   < > 4.0 3.5  3.5 3.8  CL 108   < > 108  --  108  --  107  CO2 21*   < > 24  --  21*  --  24  GLUCOSE 124*   < > 100*  --  93  --  96  BUN 19   < > 19  --  24*  --  25*  CREATININE 1.13   < > 1.24  --  1.19  --  1.22  CALCIUM 9.4   < > 9.2  --  9.1  --  9.2  PROT 6.5  --   --   --   --   --   --   ALBUMIN 3.7  --   --   --   --   --   --   AST 53*  --   --   --   --   --   --  ALT 62*  --   --   --   --   --   --   ALKPHOS 67  --   --   --   --   --   --   BILITOT 1.1  --   --   --   --   --   --   GFRNONAA >60   < > 57*  --  59*  --  58*  GFRAA >60   < > >60  --  >60  --  >60  ANIONGAP 11   < > 10  --  7  --  12   < > = values in this interval not displayed.     Hematology Recent Labs  Lab 12/10/19 0435 12/10/19 0435 12/11/19 0215 12/11/19 1605 12/12/19 0403  WBC 5.6  --  5.7  --  6.3  RBC 3.43*  --  3.67*  --  3.56*  HGB 11.0*   < > 11.8* 10.9*  10.9* 11.4*  HCT 33.0*   < > 35.8* 32.0*  32.0* 34.6*  MCV 96.2  --  97.5  --  97.2  MCH 32.1  --  32.2  --  32.0  MCHC 33.3  --  33.0  --  32.9  RDW 14.6  --  14.8  --  14.6  PLT 118*  --  131*  --  129*   < > = values in this interval not displayed.    BNP Recent Labs  Lab 12/09/19 2053 12/12/19 1038  BNP 988.3* 884.0*     DDimer No results for input(s): DDIMER in the last 168 hours.   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  Widely patent left main, RCA, with 50 to 60% ostial circumflex and 50 to 60% mid LAD stenoses.  Severe bioprosthetic valve regurgitation. RECOMMENDATIONS:  We will speak with Dr. [name] concerning  management of this patient's aortic bioprosthetic dysfunction with severe aortic regurgitation.  Currently out of heart failure and feeling back to baseline.  DG CHEST PORT 1 VIEW  Result Date: 12/11/2019 CLINICAL DATA:  Shortness of breath and decreased urinary output. Prior aortic valve prosthesis. EXAM: PORTABLE CHEST 1 VIEW COMPARISON:  12/10/2019 FINDINGS: Reversal or ptotic projection. Mild cardiomegaly. Pulmonary interstitial accentuation. Indistinct pulmonary vasculature. The appearance suggests interstitial pulmonary edema. The diaphragms are somewhat obscured due to the reverse lordotic projection. Aortic valve prosthesis noted. IMPRESSION: Mild cardiomegaly with interstitial pulmonary edema, likely from congestive heart failure. Electronically Signed   By: Van Clines M.D.   On: 12/11/2019 19:00    Cardiac Studies   Cath: 12/11/19   Severe pulmonary hypertension secondary to acute diastolic heart failure secondary to acute aortic regurgitation.  Widely patent left main, RCA, with 50 to 60% ostial circumflex and 50 to 60% mid LAD stenoses.  Severe bioprosthetic valve regurgitation.  RECOMMENDATIONS:   We will speak with Dr. [name] concerning management of this patient's aortic bioprosthetic dysfunction with severe aortic regurgitation.  Currently out of heart failure and feeling back to baseline.  Diagnostic Dominance: Right   Patient Profile     74 y.o. male with a hx of bioprosthetic aortic valve replacement in 2008 by Dr. [name] atrial flutter,hypertension, hyperlipidemiaand recent admission to [facility] 11/14/19 with pneumonia who directly admitted from clinic for acute on chronic combined CHF.  Assessment & Plan    1. Acute on  chronic combined CHF: BNP 988.Net - 6.5L. Weight is down. Back on IV lasix this morning. Breathing is stable. Underwent R/LHC noted above with no significant coronary disease.  - BNP slightly improved to 884  yesterday. Will continue with IV lasix through today. Reassess in the morning. - Continue BB  2. S/p bioprosthetic aortic valve replacement: echo noted above withvalve area, by VTI measures 1.42 cm. Aortic valve mean gradient measures 26.0 mmHg. Aortic valve Vmax measures 3.27 m/s. - underwent cath. Dr. [name] was consulted with recommendations for TAVR. This was discussed with the patient, who is agreeable. Seen by TAVR team with plans for TAVR on 4/6. Will remain inpatient until then.  3. HTN - Bp stable on current medications.   4. HLD - LDL 105, will add statin  For questions or updates, please contact [place] Please consult [email or web address] for contact info under   Signed, [name], NP  12/13/2019, 9:18 AM     Patient seen and examined. Agree with assessment and plan.  Breathing better.  I/O -[phone] since admission.  Appreciate TAVR team consultation.  For imaging studies today.  We will continue with IV Lasix.  Good urine output.  No JVD today.  No rales on exam.  Aortic systolic and diastolic murmur compatible with AS/AR.  BNP still elevated yesterday at 884, slightly improved.  We will follow up in a.m. to see if continued improvement but if further increase may need dose adjustment of furosemide.  Plan to stay in hospital with a valve in valve TAVR on Tuesday 12/17/2019 if imaging studies are appropriate   Troy Sine, MD, [facility] 12/13/2019 10:39 AM

## 2019-12-13 NOTE — Progress Notes (Signed)
Carotid artery duplex has been completed. Preliminary results can be found in CV Proc through chart review.   12/13/19 11:38 AM Carlos Levering RVT

## 2019-12-13 NOTE — Progress Notes (Signed)
[address]       [place] [address]             [phone]                 2 Days Post-Op Procedure(s) (LRB): RIGHT HEART CATH AND CORONARY/GRAFT ANGIOGRAPHY (N/A)  LOS: 4 days   Subjective: Patient , up in chair and comfortable. Noted orthopnea last night  Objective: Vital signs in last 24 hours: Patient Vitals for the past 24 hrs:  BP Temp Temp src Pulse Resp SpO2 Weight  12/13/19 0807 (!) 135/50 -- -- 74 -- -- --  12/13/19 0313 (!) 144/57 98.2 F (36.8 C) Oral 76 20 94 % 108.7 kg  12/12/19 2055 139/63 98.1 F (36.7 C) Oral 79 19 97 % --  12/12/19 1608 -- 97.7 F (36.5 C) Oral 74 -- 96 % --    Filed Weights   12/11/19 0539 12/12/19 0700 12/13/19 0313  Weight: 109.1 kg 108.6 kg 108.7 kg    Hemodynamic parameters for last 24 hours:    Intake/Output from previous day: 04/01 0701 - 04/02 0700 In: 243 [P.O.:240; I.V.:3] Out: 2025 [Urine:2025] Intake/Output this shift: No intake/output data recorded.  Scheduled Meds: . aspirin  81 mg Oral Daily  . atorvastatin  40 mg Oral q1800  . enoxaparin (LOVENOX) injection  40 mg Subcutaneous Q24H  . furosemide  40 mg Intravenous Daily  . Living Better with Heart Failure Book   Does not apply Once  . metoprolol succinate  50 mg Oral Daily  . sodium chloride flush  3 mL Intravenous Q12H  . sodium chloride flush  3 mL Intravenous Q12H  . sodium chloride flush  3 mL Intravenous Q12H   Continuous Infusions: . sodium chloride    . sodium chloride     PRN Meds:.sodium chloride, sodium chloride, acetaminophen, ondansetron (ZOFRAN) IV, sodium chloride flush, sodium chloride flush  General appearance: alert, cooperative and no distress Neurologic: intact Heart: regular rate and rhythm Lungs: diminished breath sounds bibasilar Murmur of AI present  Lab Results: CBC: Recent Labs    12/11/19 0215 12/11/19 0215 12/11/19 1605 12/12/19 0403  WBC 5.7  --   --  6.3  HGB 11.8*   < > 10.9*  10.9*  11.4*  HCT 35.8*   < > 32.0*  32.0* 34.6*  PLT 131*  --   --  129*   < > = values in this interval not displayed.   BMET:  Recent Labs    12/11/19 0215 12/11/19 0215 12/11/19 1605 12/12/19 0403  NA 136   < > 144  143 143  K 4.0   < > 3.5  3.5 3.8  CL 108  --   --  107  CO2 21*  --   --  24  GLUCOSE 93  --   --  96  BUN 24*  --   --  25*  CREATININE 1.19  --   --  1.22  CALCIUM 9.1  --   --  9.2   < > = values in this interval not displayed.    PT/INR: No results for input(s): LABPROT, INR in the last 72 hours.   Radiology CARDIAC CATHETERIZATION  Result Date: 12/11/2019  Severe pulmonary hypertension secondary to acute diastolic heart failure secondary to acute aortic regurgitation.  Widely patent left main, RCA, with 50 to 60% ostial circumflex and 50 to 60% mid LAD stenoses.  Severe bioprosthetic valve regurgitation.  RECOMMENDATIONS:  We will speak with Dr. [name] concerning management of this patient's aortic bioprosthetic dysfunction with severe aortic regurgitation.  Currently out of heart failure and feeling back to baseline.  DG CHEST PORT 1 VIEW  Result Date: 12/11/2019 CLINICAL DATA:  Shortness of breath and decreased urinary output. Prior aortic valve prosthesis. EXAM: PORTABLE CHEST 1 VIEW COMPARISON:  12/10/2019 FINDINGS: Reversal or ptotic projection. Mild cardiomegaly. Pulmonary interstitial accentuation. Indistinct pulmonary vasculature. The appearance suggests interstitial pulmonary edema. The diaphragms are somewhat obscured due to the reverse lordotic projection. Aortic valve prosthesis noted. IMPRESSION: Mild cardiomegaly with interstitial pulmonary edema, likely from congestive heart failure. Electronically Signed   By: Van Clines M.D.   On: 12/11/2019 19:00     Assessment/Plan: S/P Procedure(s) (LRB): RIGHT HEART CATH AND CORONARY/GRAFT ANGIOGRAPHY (N/A) Scans for TVAR  eval proceeding  Old op note - scanned and in the media  tab    Grace Isaac MD 12/13/2019 9:15 AM

## 2019-12-13 NOTE — Plan of Care (Signed)
  Problem: Education: Goal: Knowledge of General Education information will improve Description: Including pain rating scale, medication(s)/side effects and non-pharmacologic comfort measures Outcome: Progressing   Problem: Health Behavior/Discharge Planning: Goal: Ability to manage health-related needs will improve Outcome: Progressing   Problem: Clinical Measurements: Goal: Will remain free from infection Outcome: Progressing   Problem: Activity: Goal: Risk for activity intolerance will decrease Outcome: Progressing   Problem: Nutrition: Goal: Adequate nutrition will be maintained Outcome: Progressing   Problem: Coping: Goal: Level of anxiety will decrease Outcome: Progressing   Problem: Elimination: Goal: Will not experience complications related to bowel motility Outcome: Progressing   Problem: Pain Managment: Goal: General experience of comfort will improve Outcome: Progressing   Problem: Safety: Goal: Ability to remain free from injury will improve Outcome: Progressing

## 2019-12-13 NOTE — Evaluation (Addendum)
Physical Therapy Evaluation Patient Details Name: Douglas Hart MRN: [id number] DOB: [birth year] Today's Date: 12/13/2019   History of Present Illness  74 y.o. 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 Presented to ED w/ c/o CHF and cardiac valve problems. 3/4- 11/17/19 hospitalized w/ respiratory distress. Pt found to have acute on chronic CHF, orthopnea, PND, BLE swelling, and bioprosthetic aortic valve dysfunction. Also admitted for IV diuresis. s/p RIGHT/LEFT HEART CATH AND CORONARY ANGIOGRAPHY 3/31.  Clinical Impression   Pt admitted with above hx and above dx. 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 He ha also taken to using walker sec to having L knee instability and pain. This am pt is moving very well, denies pain in knee but cautious using sleeve brace on knee and walker to complete ambulation. Pt was able to amblate approx 614ft with RW and SBA, no frank balance deficits noted, HR increased from low 80s to low 90s with ambulation  But pt remained quite asymptiomatic. Pt is scheduled to have TAVR on 12/17/19. Will continue to work with pt while in hospital and re-assess after TAVR and ammend recommendation as needed.     Follow Up Recommendations No PT follow up;Other (comment)(will re-assess after surgery)    Equipment Recommendations  None recommended by PT    Recommendations for Other Services       Precautions / Restrictions Precautions Precautions: Fall Restrictions Weight Bearing Restrictions: No      Mobility  Bed Mobility Overal bed mobility: Modified Independent                Transfers Overall transfer level: Modified independent                  Ambulation/Gait Ambulation/Gait assistance: Supervision Gait Distance (Feet): 600 Feet Assistive device: Rolling walker (2 wheeled) Gait  Pattern/deviations: Step-through pattern Gait velocity: fair   General Gait Details: ambulated on room air VSS HR from low 80s to low 90s  Stairs            Wheelchair Mobility    Modified Rankin (Stroke Patients Only)       Balance Overall balance assessment: No apparent balance deficits (not formally assessed)                                           Pertinent Vitals/Pain Pain Assessment: No/denies pain    Home Living Family/patient expects to be discharged to:: Private residence Living Arrangements: Spouse/significant other Available Help at Discharge: Family Type of Home: House Home Access: Stairs to enter   Technical brewer of Steps: 3 Home Layout: One level Home Equipment: Environmental consultant - 2 wheels;Shower seat      Prior Function Level of Independence: Independent;Independent with assistive device(s)               Hand Dominance        Extremity/Trunk Assessment   Upper Extremity Assessment Upper Extremity Assessment: Overall WFL for tasks assessed    Lower Extremity Assessment Lower Extremity Assessment: Overall WFL for tasks assessed    Cervical / Trunk Assessment Cervical / Trunk Assessment: Normal  Communication   Communication: No difficulties  Cognition Arousal/Alertness: Awake/alert Behavior During Therapy: WFL for tasks assessed/performed Overall Cognitive Status: Within Functional Limits for tasks assessed  General Comments      Exercises     Assessment/Plan    PT Assessment Patient needs continued PT services  PT Problem List Decreased activity tolerance       PT Treatment Interventions Gait training;DME instruction;Stair training;Functional mobility training;Therapeutic activities;Therapeutic exercise;Balance training;Neuromuscular re-education;Patient/family education    PT Goals (Current goals can be found in the Care Plan section)  Acute  Rehab PT Goals Patient Stated Goal: "Whatever i can do to get back into exercising i'm gonna do it" PT Goal Formulation: With patient Time For Goal Achievement: 12/27/19 Potential to Achieve Goals: Good    Frequency Min 3X/week   Barriers to discharge        Co-evaluation               AM-PAC PT "6 Clicks" Mobility  Outcome Measure Help needed turning from your back to your side while in a flat bed without using bedrails?: None Help needed moving from lying on your back to sitting on the side of a flat bed without using bedrails?: None Help needed moving to and from a bed to a chair (including a wheelchair)?: None Help needed standing up from a chair using your arms (e.g., wheelchair or bedside chair)?: None Help needed to walk in hospital room?: A Little Help needed climbing 3-5 steps with a railing? : A Little 6 Click Score: 22    End of Session   Activity Tolerance: Patient tolerated treatment well Patient left: in chair;with call bell/phone within reach   PT Visit Diagnosis: Other abnormalities of gait and mobility (R26.89)    Time: [id number] PT Time Calculation (min) (ACUTE ONLY): 21 min   Charges:   PT Evaluation $PT Eval Moderate Complexity: [place], PT   Delford Field 12/13/2019, 1:17 PM

## 2019-12-14 DIAGNOSIS — Z952 Presence of prosthetic heart valve: Secondary | ICD-10-CM

## 2019-12-14 DIAGNOSIS — E782 Mixed hyperlipidemia: Secondary | ICD-10-CM

## 2019-12-14 LAB — BASIC METABOLIC PANEL
Anion gap: 11 (ref 5–15)
BUN: 28 mg/dL — ABNORMAL HIGH (ref 8–23)
CO2: 25 mmol/L (ref 22–32)
Calcium: 9.3 mg/dL (ref 8.9–10.3)
Chloride: 105 mmol/L (ref 98–111)
Creatinine, Ser: 1.33 mg/dL — ABNORMAL HIGH (ref 0.61–1.24)
GFR calc Af Amer: >60 mL/min (ref >60)
GFR calc non Af Amer: 52 mL/min — ABNORMAL LOW (ref >60)
Glucose, Bld: 95 mg/dL (ref 70–99)
Potassium: 3.6 mmol/L (ref 3.5–5.1)
Sodium: 141 mmol/L (ref 135–145)

## 2019-12-14 NOTE — Progress Notes (Signed)
Progress Note  Patient Name: Douglas Hart Date of Encounter: 12/14/2019  Primary Cardiologist: Belva Crome III, MD   Subjective   No chest pain - breathing well. Structural heart team planning TAVR on Tuesday, 4/6.  Inpatient Medications    Scheduled Meds: . aspirin  81 mg Oral Daily  . atorvastatin  40 mg Oral q1800  . enoxaparin (LOVENOX) injection  40 mg Subcutaneous Q24H  . furosemide  40 mg Intravenous Daily  . Living Better with Heart Failure Book   Does not apply Once  . metoprolol succinate  50 mg Oral Daily  . sodium chloride flush  3 mL Intravenous Q12H  . sodium chloride flush  3 mL Intravenous Q12H  . sodium chloride flush  3 mL Intravenous Q12H   Continuous Infusions: . sodium chloride    . sodium chloride     PRN Meds: sodium chloride, sodium chloride, acetaminophen, ondansetron (ZOFRAN) IV, sodium chloride flush, sodium chloride flush   Vital Signs    Vitals:   12/13/19 1711 12/13/19 2042 12/14/19 0417 12/14/19 0905  BP: (!) 130/54 (!) 122/53 (!) 117/49 129/83  Pulse: 79 74 69 88  Resp:  16 16   Temp:  (!) 97.5 F (36.4 C) 97.8 F (36.6 C)   TempSrc:  Oral Oral   SpO2:  98% 93%   Weight:   107.8 kg   Height:        Intake/Output Summary (Last 24 hours) at 12/14/2019 1000 Last data filed at 12/14/2019 0900 Gross per 24 hour  Intake 1206 ml  Output 1775 ml  Net -569 ml   Last 3 Weights 12/14/2019 12/13/2019 12/12/2019  Weight (lbs) 237 lb 10.5 oz 239 lb 10.2 oz 239 lb 6.4 oz  Weight (kg) 107.8 kg 108.7 kg 108.591 kg      Telemetry    Sinus rhythm - Personally Reviewed  ECG    N/A  Physical Exam   General appearance: alert and no distress Lungs: clear to auscultation bilaterally Heart: regular rate and rhythm, S1, S2 normal and systolic murmur: systolic ejection 3/6, crescendo at 2nd right intercostal space Extremities: extremities normal, atraumatic, no cyanosis or edema Neurologic: Grossly normal   Labs    High Sensitivity  Troponin:  No results for input(s): TROPONINIHS in the last 720 hours.    Chemistry Recent Labs  Lab 12/09/19 2053 12/10/19 0435 12/12/19 0403 12/13/19 0901 12/14/19 0351  NA 140   < > 143 142 141  K 3.8   < > 3.8 3.9 3.6  CL 108   < > 107 108 105  CO2 21*   < > 24 25 25   GLUCOSE 124*   < > 96 102* 95  BUN 19   < > 25* 26* 28*  CREATININE 1.13   < > 1.22 1.43* 1.33*  CALCIUM 9.4   < > 9.2 9.5 9.3  PROT 6.5  --   --   --   --   ALBUMIN 3.7  --   --   --   --   AST 53*  --   --   --   --   ALT 62*  --   --   --   --   ALKPHOS 67  --   --   --   --   BILITOT 1.1  --   --   --   --   GFRNONAA >60   < > 58* 48* 52*  GFRAA >60   < > >  60 55* >60  ANIONGAP 11   < > 12 9 11    < > = values in this interval not displayed.     Hematology Recent Labs  Lab 12/10/19 0435 12/10/19 0435 12/11/19 0215 12/11/19 1605 12/12/19 0403  WBC 5.6  --  5.7  --  6.3  RBC 3.43*  --  3.67*  --  3.56*  HGB 11.0*   < > 11.8* 10.9*  10.9* 11.4*  HCT 33.0*   < > 35.8* 32.0*  32.0* 34.6*  MCV 96.2  --  97.5  --  97.2  MCH 32.1  --  32.2  --  32.0  MCHC 33.3  --  33.0  --  32.9  RDW 14.6  --  14.8  --  14.6  PLT 118*  --  131*  --  129*   < > = values in this interval not displayed.    BNP Recent Labs  Lab 12/09/19 2053 12/12/19 1038  BNP 988.3* 884.0*     DDimer No results for input(s): DDIMER in the last 168 hours.   Radiology    VAS US CAROTID  Result Date: 12/13/2019 Carotid Arterial Duplex Study Indications:       Pre-TAVR. Risk Factors:      Hypertension, hyperlipidemia. Comparison Study:  No prior studies. Performing Technologist: Oliver Hum RVT  Examination Guidelines: A complete evaluation includes B-mode imaging, spectral Doppler, color Doppler, and power Doppler as needed of all accessible portions of each vessel. Bilateral testing is considered an integral part of a complete examination. Limited examinations for reoccurring indications may be performed as noted.  Right  Carotid Findings: +----------+--------+--------+--------+-----------------------+--------+           PSV cm/sEDV cm/sStenosisPlaque Description     Comments +----------+--------+--------+--------+-----------------------+--------+ CCA Prox  135     1               smooth and heterogenous         +----------+--------+--------+--------+-----------------------+--------+ CCA Distal101     8               smooth and heterogenous         +----------+--------+--------+--------+-----------------------+--------+ ICA Prox  73      11              smooth and heterogenous         +----------+--------+--------+--------+-----------------------+--------+ ICA Distal88      13                                     tortuous +----------+--------+--------+--------+-----------------------+--------+ ECA       104     0                                               +----------+--------+--------+--------+-----------------------+--------+ +----------+--------+-------+--------+-------------------+           PSV cm/sEDV cmsDescribeArm Pressure (mmHG) +----------+--------+-------+--------+-------------------+ Subclavian122                                        +----------+--------+-------+--------+-------------------+ +---------+--------+--+--------+-+---------+ VertebralPSV cm/s39EDV cm/s3Antegrade +---------+--------+--+--------+-+---------+  Left Carotid Findings: +----------+--------+--------+--------+-----------------------+--------+           PSV cm/sEDV cm/sStenosisPlaque Description     Comments +----------+--------+--------+--------+-----------------------+--------+  CCA Prox  139     11              smooth and heterogenous         +----------+--------+--------+--------+-----------------------+--------+ CCA Distal104     9               smooth and heterogenous         +----------+--------+--------+--------+-----------------------+--------+ ICA Prox  85       7               smooth and heterogenoustortuous +----------+--------+--------+--------+-----------------------+--------+ ICA Distal80      10                                     tortuous +----------+--------+--------+--------+-----------------------+--------+ ECA       65      0                                               +----------+--------+--------+--------+-----------------------+--------+ +----------+--------+--------+--------+-------------------+           PSV cm/sEDV cm/sDescribeArm Pressure (mmHG) +----------+--------+--------+--------+-------------------+ [id number]                                         +----------+--------+--------+--------+-------------------+ +---------+--------+--+--------+-+---------+ VertebralPSV cm/s56EDV cm/s7Antegrade +---------+--------+--+--------+-+---------+   Summary: Right Carotid: Velocities in the right ICA are consistent with a 1-39% stenosis. Left Carotid: Velocities in the left ICA are consistent with a 1-39% stenosis. Vertebrals: Bilateral vertebral arteries demonstrate antegrade flow. *See table(s) above for measurements and observations.  Electronically signed by Curt Jews MD on 12/13/2019 at 2:23:05 PM.    Final     Cardiac Studies   Cath: 12/11/19   Severe pulmonary hypertension secondary to acute diastolic heart failure secondary to acute aortic regurgitation.  Widely patent left main, RCA, with 50 to 60% ostial circumflex and 50 to 60% mid LAD stenoses.  Severe bioprosthetic valve regurgitation.  RECOMMENDATIONS:   We will speak with Dr. [name] concerning management of this patient's aortic bioprosthetic dysfunction with severe aortic regurgitation.  Currently out of heart failure and feeling back to baseline.  Diagnostic Dominance: Right   Patient Profile     74 y.o. male with a hx of bioprosthetic aortic valve replacement in 2008 by Dr. [name] atrial flutter,hypertension,  hyperlipidemiaand recent admission to [facility] 11/14/19 with pneumonia who directly admitted from clinic for acute on chronic combined CHF.  Assessment & Plan    1. Acute on chronic combined CHF: BNP 988.Net - 6.5L. Weight is down. Back on IV lasix this morning. Breathing is stable. Underwent R/LHC noted above with no significant coronary disease.  - BNP slightly improved to 884 yesterday. Diuresed about 1L negative overnight. Creatinine stable -continue IV lasix today -Continue BB  2. S/p bioprosthetic aortic valve replacement: echo noted above withvalve area, by VTI measures 1.42 cm. Aortic valve mean gradient measures 26.0 mmHg. Aortic valve Vmax measures 3.27 m/s. - underwent cath. Dr. [name] was consulted with recommendations for TAVR. This was discussed with the patient, who is agreeable. Seen by TAVR team with plans for TAVR on 4/6. Will remain inpatient until then.  3. HTN - Bp stable on current medications.   4. HLD - LDL 105,  will add statin  For questions or updates, please contact [place] Please consult [email or web address] for contact info under   Pixie Casino, MD, FACC, [place] Director of the Advanced Lipid Disorders &  Cardiovascular Risk Reduction Clinic Diplomate of the American Board of Clinical Lipidology Attending Cardiologist  Direct Dial: [phone]  Fax: [phone]  Website:  [email or web address]  Pixie Casino, MD  12/14/2019, 10:00 AM

## 2019-12-15 DIAGNOSIS — T8209XA Other mechanical complication of heart valve prosthesis, initial encounter: Secondary | ICD-10-CM

## 2019-12-15 LAB — CULTURE, BLOOD (ROUTINE X 2)
Culture: NO GROWTH
Culture: NO GROWTH
Special Requests: ADEQUATE
Special Requests: ADEQUATE

## 2019-12-15 LAB — BASIC METABOLIC PANEL
Anion gap: 11 (ref 5–15)
BUN: 25 mg/dL — ABNORMAL HIGH (ref 8–23)
CO2: 24 mmol/L (ref 22–32)
Calcium: 9.2 mg/dL (ref 8.9–10.3)
Chloride: 105 mmol/L (ref 98–111)
Creatinine, Ser: 1.2 mg/dL (ref 0.61–1.24)
GFR calc Af Amer: >60 mL/min (ref >60)
GFR calc non Af Amer: 59 mL/min — ABNORMAL LOW (ref >60)
Glucose, Bld: 95 mg/dL (ref 70–99)
Potassium: 3.2 mmol/L — ABNORMAL LOW (ref 3.5–5.1)
Sodium: 140 mmol/L (ref 135–145)

## 2019-12-15 MED ORDER — POTASSIUM CHLORIDE CRYS ER 20 MEQ PO TBCR
40.0000 meq | EXTENDED_RELEASE_TABLET | Freq: Once | ORAL | Status: AC
  Administered 2019-12-15: 40 meq via ORAL
  Filled 2019-12-15: qty 2

## 2019-12-15 MED ORDER — FUROSEMIDE 40 MG PO TABS
40.0000 mg | ORAL_TABLET | Freq: Every day | ORAL | Status: DC
  Administered 2019-12-16: 08:00:00 40 mg via ORAL
  Filled 2019-12-15: qty 1

## 2019-12-15 NOTE — Progress Notes (Signed)
Progress Note  Patient Name: Douglas Hart Date of Encounter: 12/15/2019  Primary Cardiologist: [name], MD   Subjective   No issues overnight. Structural heart team planning TAVR on Tuesday, 4/6. Potassium low today at 3.2.  Inpatient Medications    Scheduled Meds: . aspirin  81 mg Oral Daily  . atorvastatin  40 mg Oral q1800  . enoxaparin (LOVENOX) injection  40 mg Subcutaneous Q24H  . furosemide  40 mg Intravenous Daily  . Living Better with Heart Failure Book   Does not apply Once  . metoprolol succinate  50 mg Oral Daily  . sodium chloride flush  3 mL Intravenous Q12H  . sodium chloride flush  3 mL Intravenous Q12H  . sodium chloride flush  3 mL Intravenous Q12H   Continuous Infusions: . sodium chloride    . sodium chloride     PRN Meds: sodium chloride, sodium chloride, acetaminophen, ondansetron (ZOFRAN) IV, sodium chloride flush, sodium chloride flush   Vital Signs    Vitals:   12/14/19 0905 12/14/19 2053 12/15/19 0646 12/15/19 0857  BP: 129/83 (!) 133/97 (!) 142/59 (!) 133/57  Pulse: 88 72 77 87  Resp:      Temp:  97.7 F (36.5 C) 97.8 F (36.6 C)   TempSrc:  Oral Oral   SpO2:  96% 96%   Weight:   113.4 kg   Height:        Intake/Output Summary (Last 24 hours) at 12/15/2019 1148 Last data filed at 12/15/2019 1106 Gross per 24 hour  Intake 486 ml  Output 2950 ml  Net -2464 ml   Last 3 Weights 12/15/2019 12/14/2019 12/13/2019  Weight (lbs) 250 lb 237 lb 10.5 oz 239 lb 10.2 oz  Weight (kg) 113.4 kg 107.8 kg 108.7 kg      Telemetry    Sinus rhythm - Personally Reviewed  ECG    N/A  Physical Exam   General appearance: alert and no distress Lungs: clear to auscultation bilaterally Heart: regular rate and rhythm, S1, S2 normal and systolic murmur: systolic ejection 3/6, crescendo at 2nd right intercostal space Extremities: extremities normal, atraumatic, no cyanosis or edema Neurologic: Grossly normal   Labs    High Sensitivity  Troponin:  No results for input(s): TROPONINIHS in the last 720 hours.    Chemistry Recent Labs  Lab 12/09/19 2053 12/10/19 0435 12/13/19 0901 12/14/19 0351 12/15/19 0401  NA 140   < > 142 141 140  K 3.8   < > 3.9 3.6 3.2*  CL 108   < > 108 105 105  CO2 21*   < > 25 25 24   GLUCOSE 124*   < > 102* 95 95  BUN 19   < > 26* 28* 25*  CREATININE 1.13   < > 1.43* 1.33* 1.20  CALCIUM 9.4   < > 9.5 9.3 9.2  PROT 6.5  --   --   --   --   ALBUMIN 3.7  --   --   --   --   AST 53*  --   --   --   --   ALT 62*  --   --   --   --   ALKPHOS 67  --   --   --   --   BILITOT 1.1  --   --   --   --   GFRNONAA >60   < > 48* 52* 59*  GFRAA >60   < >  55* >60 >60  ANIONGAP 11   < > 9 11 11    < > = values in this interval not displayed.     Hematology Recent Labs  Lab 12/10/19 0435 12/10/19 0435 12/11/19 0215 12/11/19 1605 12/12/19 0403  WBC 5.6  --  5.7  --  6.3  RBC 3.43*  --  3.67*  --  3.56*  HGB 11.0*   < > 11.8* 10.9*  10.9* 11.4*  HCT 33.0*   < > 35.8* 32.0*  32.0* 34.6*  MCV 96.2  --  97.5  --  97.2  MCH 32.1  --  32.2  --  32.0  MCHC 33.3  --  33.0  --  32.9  RDW 14.6  --  14.8  --  14.6  PLT 118*  --  131*  --  129*   < > = values in this interval not displayed.    BNP Recent Labs  Lab 12/09/19 2053 12/12/19 1038  BNP 988.3* 884.0*     DDimer No results for input(s): DDIMER in the last 168 hours.   Radiology    No results found.  Cardiac Studies   Cath: 12/11/19   Severe pulmonary hypertension secondary to acute diastolic heart failure secondary to acute aortic regurgitation.  Widely patent left main, RCA, with 50 to 60% ostial circumflex and 50 to 60% mid LAD stenoses.  Severe bioprosthetic valve regurgitation.  RECOMMENDATIONS:   We will speak with Dr. [name] concerning management of this patient's aortic bioprosthetic dysfunction with severe aortic regurgitation.  Currently out of heart failure and feeling back to  baseline.  Diagnostic Dominance: Right   Patient Profile     74 y.o. male with a hx of bioprosthetic aortic valve replacement in 2008 by Dr. [name] atrial flutter,hypertension, hyperlipidemiaand recent admission to [facility] 11/14/19 with pneumonia who directly admitted from clinic for acute on chronic combined CHF.  Assessment & Plan    1. Acute on chronic combined CHF: BNP 988.Net - 6.5L. Weight is down. Back on IV lasix this morning. Breathing is stable. Underwent R/LHC noted above with no significant coronary disease.  - BNP slightly improved to 884 yesterday. Diuresed less than 1L negative overnight, overall 8L negative. Creatinine stable/improved - now 1.2 today. -switch from IV to po lasix 40 mg daily tomorrow -Continue BB  2. S/p bioprosthetic aortic valve replacement: echo noted above withvalve area, by VTI measures 1.42 cm. Aortic valve mean gradient measures 26.0 mmHg. Aortic valve Vmax measures 3.27 m/s. - underwent cath. Dr. [name] was consulted with recommendations for TAVR. This was discussed with the patient, who is agreeable. Seen by TAVR team with plans for TAVR on 4/6. Will remain inpatient until then.  3. HTN - Bp stable on current medications.    4. HLD - LDL 105, will add statin  5. Hypokalemia - replete 40MEQ today, check magnesium with labs tomorrow am  For questions or updates, please contact [place] Please consult [email or web address] for contact info under   Pixie Casino, MD, FACC, [place] Director of the Advanced Lipid Disorders &  Cardiovascular Risk Reduction Clinic Diplomate of the American Board of Clinical Lipidology Attending Cardiologist  Direct Dial: [phone]  Fax: [phone]  Website:  [email or web address]  Pixie Casino, MD  12/15/2019, 11:48 AM

## 2019-12-16 DIAGNOSIS — T82897A Other specified complication of cardiac prosthetic devices, implants and grafts, initial encounter: Principal | ICD-10-CM

## 2019-12-16 DIAGNOSIS — I350 Nonrheumatic aortic (valve) stenosis: Secondary | ICD-10-CM

## 2019-12-16 DIAGNOSIS — G4733 Obstructive sleep apnea (adult) (pediatric): Secondary | ICD-10-CM

## 2019-12-16 HISTORY — DX: Obstructive sleep apnea (adult) (pediatric): G47.33

## 2019-12-16 LAB — CULTURE, BLOOD (ROUTINE X 2)
Culture: NO GROWTH
Culture: NO GROWTH

## 2019-12-16 LAB — PROTIME-INR
INR: 1.1 (ref 0.8–1.2)
Prothrombin Time: 13.6 seconds (ref 11.4–15.2)

## 2019-12-16 LAB — BASIC METABOLIC PANEL
Anion gap: 11 (ref 5–15)
BUN: 24 mg/dL — ABNORMAL HIGH (ref 8–23)
CO2: 24 mmol/L (ref 22–32)
Calcium: 9.4 mg/dL (ref 8.9–10.3)
Chloride: 106 mmol/L (ref 98–111)
Creatinine, Ser: 1.31 mg/dL — ABNORMAL HIGH (ref 0.61–1.24)
GFR calc Af Amer: >60 mL/min (ref >60)
GFR calc non Af Amer: 53 mL/min — ABNORMAL LOW (ref >60)
Glucose, Bld: 159 mg/dL — ABNORMAL HIGH (ref 70–99)
Potassium: 3.4 mmol/L — ABNORMAL LOW (ref 3.5–5.1)
Sodium: 141 mmol/L (ref 135–145)

## 2019-12-16 LAB — TYPE AND SCREEN
ABO/RH(D): O POS
Antibody Screen: NEGATIVE

## 2019-12-16 LAB — MAGNESIUM: Magnesium: 2.2 mg/dL (ref 1.7–2.4)

## 2019-12-16 MED ORDER — SODIUM CHLORIDE 0.9 % IV SOLN
INTRAVENOUS | Status: DC
  Filled 2019-12-16: qty 30

## 2019-12-16 MED ORDER — SODIUM CHLORIDE 0.9 % IV SOLN
1.5000 g | INTRAVENOUS | Status: AC
  Administered 2019-12-17: 1.5 g via INTRAVENOUS
  Filled 2019-12-16: qty 1.5

## 2019-12-16 MED ORDER — CHLORHEXIDINE GLUCONATE 0.12 % MT SOLN: 15.0000 mL | Freq: Once | OROMUCOSAL | Status: DC

## 2019-12-16 MED ORDER — DEXMEDETOMIDINE HCL IN NACL 400 MCG/100ML IV SOLN
0.1000 ug/kg/h | INTRAVENOUS | Status: AC
  Administered 2019-12-17: 1 ug/kg/h via INTRAVENOUS
  Filled 2019-12-16 (×2): qty 100

## 2019-12-16 MED ORDER — CHLORHEXIDINE GLUCONATE 4 % EX LIQD
1.0000 "application " | Freq: Once | CUTANEOUS | Status: AC
  Administered 2019-12-16: 1 via TOPICAL
  Filled 2019-12-16: qty 60

## 2019-12-16 MED ORDER — TEMAZEPAM 15 MG PO CAPS: 15.0000 mg | ORAL_CAPSULE | Freq: Once | ORAL | Status: DC | PRN

## 2019-12-16 MED ORDER — POTASSIUM CHLORIDE 2 MEQ/ML IV SOLN
80.0000 meq | INTRAVENOUS | Status: DC
  Filled 2019-12-16: qty 40

## 2019-12-16 MED ORDER — POTASSIUM CHLORIDE CRYS ER 20 MEQ PO TBCR
40.0000 meq | EXTENDED_RELEASE_TABLET | Freq: Two times a day (BID) | ORAL | Status: AC
  Administered 2019-12-16 (×2): 40 meq via ORAL
  Filled 2019-12-16 (×2): qty 2

## 2019-12-16 MED ORDER — NOREPINEPHRINE 4 MG/250ML-% IV SOLN
0.0000 ug/min | INTRAVENOUS | Status: AC
  Administered 2019-12-17: 2 ug/min via INTRAVENOUS
  Filled 2019-12-16 (×2): qty 250

## 2019-12-16 MED ORDER — MAGNESIUM SULFATE 50 % IJ SOLN
40.0000 meq | INTRAMUSCULAR | Status: DC
  Filled 2019-12-16: qty 9.85

## 2019-12-16 MED ORDER — VANCOMYCIN HCL 1500 MG/300ML IV SOLN
1500.0000 mg | INTRAVENOUS | Status: AC
  Administered 2019-12-17: 1500 mg via INTRAVENOUS
  Filled 2019-12-16 (×2): qty 300

## 2019-12-16 MED ORDER — BISACODYL 5 MG PO TBEC
5.0000 mg | DELAYED_RELEASE_TABLET | Freq: Once | ORAL | Status: AC
  Administered 2019-12-16: 5 mg via ORAL
  Filled 2019-12-16: qty 1

## 2019-12-16 NOTE — Progress Notes (Addendum)
[facility]  Patient Name: Douglas Hart Date of Encounter: 12/16/2019  Primary Cardiologist: Dr. Laurena Bering Problem List     Principal Problem:   Aortic prosthetic valve regurgitation Active Problems:   H/O aortic valve replacement   Essential hypertension   Hyperlipidemia   Acute diastolic congestive heart failure (HCC)   OSA on CPAP    Subjective   No complaints. Slept okay last night. Feels better sleeping in recliner. Ready for surgery tomorrow. Has several questions. Enjoyed reviewing his CT scans with Dr. Burt Knack this morning .  Inpatient Medications    Scheduled Meds: . aspirin  81 mg Oral Daily  . atorvastatin  40 mg Oral q1800  . enoxaparin (LOVENOX) injection  40 mg Subcutaneous Q24H  . furosemide  40 mg Oral Daily  . Living Better with Heart Failure Book   Does not apply Once  . metoprolol succinate  50 mg Oral Daily  . potassium chloride  40 mEq Oral BID  . sodium chloride flush  3 mL Intravenous Q12H  . sodium chloride flush  3 mL Intravenous Q12H  . sodium chloride flush  3 mL Intravenous Q12H   Continuous Infusions: . sodium chloride    . sodium chloride     PRN Meds: sodium chloride, sodium chloride, acetaminophen, ondansetron (ZOFRAN) IV, sodium chloride flush, sodium chloride flush   Vital Signs    Vitals:   12/15/19 1423 12/15/19 1932 12/16/19 0330 12/16/19 0828  BP: (!) 111/53 128/66 (!) 124/54 (!) 141/57  Pulse: 78 75 71   Resp: 18 16 18    Temp: 98.1 F (36.7 C) (!) 97.5 F (36.4 C) 98 F (36.7 C)   TempSrc: Oral Oral Oral   SpO2: 98% 98% 96%   Weight:   107.8 kg   Height:        Intake/Output Summary (Last 24 hours) at 12/16/2019 1239 Last data filed at 12/16/2019 1008 Gross per 24 hour  Intake 705 ml  Output 1325 ml  Net -620 ml   Filed Weights   12/14/19 0417 12/15/19 0646 12/16/19 0330  Weight: 107.8 kg 113.4 kg 107.8 kg    Physical Exam    GEN: Well nourished,  well developed, in no acute distress.  HEENT: Grossly normal.  Neck: Supple, no JVD, carotid bruits, or masses. Cardiac: RRR, 3/6 SEM heart best at RUSB and LUSB. Soft decrescendo murmur at RLSB. No rubs, or gallops. No clubbing, cyanosis, resolved LE edema.   Respiratory:  Respirations regular and unlabored, clear to auscultation bilaterally. GI: Soft, nontender, nondistended, BS + x 4. MS: no deformity or atrophy. Skin: warm and dry, no rash. Neuro:  Strength and sensation are intact. Psych: AAOx3.  Normal affect.  Labs    CBC No results for input(s): WBC, NEUTROABS, HGB, HCT, MCV, PLT in the last 72 hours. Basic Metabolic Panel Recent Labs    12/15/19 0401 12/16/19 0325  NA 140 141  K 3.2* 3.4*  CL 105 106  CO2 24 24  GLUCOSE 95 159*  BUN 25* 24*  CREATININE 1.20 1.31*  CALCIUM 9.2 9.4  MG  --  2.2   Liver Function Tests No results for input(s): AST, ALT, ALKPHOS, BILITOT, PROT, ALBUMIN in the last 72 hours. No results for input(s): LIPASE, AMYLASE in the last 72 hours. Cardiac Enzymes No results for input(s): CKTOTAL, CKMB, CKMBINDEX, TROPONINI in the last 72 hours. BNP Invalid input(s): POCBNP D-Dimer No results for input(s): DDIMER  in the last 72 hours. Hemoglobin A1C No results for input(s): HGBA1C in the last 72 hours. Fasting Lipid Panel No results for input(s): CHOL, HDL, LDLCALC, TRIG, CHOLHDL, LDLDIRECT in the last 72 hours. Thyroid Function Tests No results for input(s): TSH, T4TOTAL, T3FREE, THYROIDAB in the last 72 hours.  Invalid input(s): Dexter  Radiology    No results found.  Cardiac Studies   Echo 12/10/19 IMPRESSIONS  1. Left ventricular ejection fraction, by estimation, is 65 to 70%. The  left ventricle has normal function. The left ventricle has no regional  wall motion abnormalities. There is mild left ventricular hypertrophy.  Left ventricular diastolic function  could not be evaluated.  2. Right ventricular systolic function is  normal. The right ventricular  size is normal. There is mildly elevated pulmonary artery systolic  pressure. The estimated right ventricular systolic pressure is 99991111 mmHg.  3. Left atrial size was severely dilated.  4. The mitral valve is abnormal. Trivial mitral valve regurgitation.  5. The tricuspid valve is abnormal.  6. Valve leaflets are thickened with reduced mobility - the cusp in the  area of the left coronary artery is not well-visualized and may be  immobile. The aortic valve has been repaired/replaced. Aortic valve  regurgitation is severe. Mild to moderate  aortic valve stenosis. There is a Unknown bioprosthetic valve present in  the aortic position. Aortic regurgitation PHT measures 140 msec. Aortic  valve area, by VTI measures 1.42 cm. Aortic valve mean gradient measures  26.0 mmHg. Aortic valve Vmax  measures 3.27 m/s.  7. Aortic dilatation noted. There is mild to moderate dilatation of the  ascending aorta measuring 42 mm.  8. The inferior vena cava is dilated in size with <50% respiratory  variability, suggesting right atrial pressure of 15 mmHg.   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 Recommend TEE to further evalute and  to more definitively evaluate for endocarditis - this could not be  excluded on the basis of this study.   FINDINGS  Left Ventricle: Left ventricular ejection fraction, by estimation, is 65  to 70%. The left ventricle has normal function. The left ventricle has no  regional wall motion abnormalities. The left ventricular internal cavity  size was normal in size. There is  mild left ventricular hypertrophy. Left ventricular diastolic function  could not be evaluated due to atrial fibrillation. Left ventricular  diastolic function could not be evaluated.   Right Ventricle: The right ventricular size is normal. No increase in  right ventricular wall thickness. Right  ventricular systolic function is  normal. There is mildly elevated pulmonary artery systolic pressure. The  tricuspid regurgitant velocity is 2.48  m/s, and with an assumed right atrial pressure of 15 mmHg, the estimated  right ventricular systolic pressure is 99991111 mmHg.   Left Atrium: Left atrial size was severely dilated.   Right Atrium: Right atrial size was normal in size.   Pericardium: There is no evidence of pericardial effusion.   Mitral Valve: The mitral valve is abnormal. There is mild thickening of  the mitral valve leaflet(s). Trivial mitral valve regurgitation.   Tricuspid Valve: The tricuspid valve is abnormal. Tricuspid valve  regurgitation is trivial.   Aortic Valve: Valve leaflets are thickened with reduced mobility - the  cusp in the area of the left coronary artery is not well-visualized and  may be immobile. The aortic valve has been repaired/replaced. Aortic valve  regurgitation is severe. Aortic  regurgitation PHT measures  140 msec. Mild to moderate aortic stenosis is  present. Aortic valve mean gradient measures 26.0 mmHg. Aortic valve peak  gradient measures 42.8 mmHg. Aortic valve area, by VTI measures 1.42 cm.  There is a Unknown bioprosthetic  valve present in the aortic position.   Pulmonic Valve: The pulmonic valve was normal in structure. Pulmonic valve  regurgitation is not visualized.   Aorta: Aortic dilatation noted. There is mild to moderate dilatation of  the ascending aorta measuring 42 mm.   Venous: The inferior vena cava is dilated in size with less than 50%  respiratory variability, suggesting right atrial pressure of 15 mmHg.   IAS/Shunts: No atrial level shunt detected by color flow Doppler.     LEFT VENTRICLE  PLAX 2D  LVIDd:     4.90 cm Diastology  LVIDs:     3.00 cm LV e' lateral:  8.16 cm/s  LV PW:     1.10 cm LV E/e' lateral: 13.7  LV IVS:    1.20 cm LV e' medial:  5.66 cm/s  LVOT diam:   2.10 cm  LV E/e' medial: 19.8  LV SV:     96  LV SV Index:  41  LVOT Area:   3.46 cm     RIGHT VENTRICLE  RV S prime:   11.30 cm/s  TAPSE (M-mode): 1.8 cm   LEFT ATRIUM       Index    RIGHT ATRIUM      Index  LA diam:    3.90 cm 1.67 cm/m RA Area:   21.00 cm  LA Vol (A2C):  141.0 ml 60.42 ml/m RA Volume:  62.00 ml 26.57 ml/m  LA Vol (A4C):  133.0 ml 56.99 ml/m  LA Biplane Vol: 143.0 ml 61.28 ml/m  AORTIC VALVE  AV Area (Vmax):  1.29 cm  AV Area (Vmean):  1.31 cm  AV Area (VTI):   1.42 cm  AV Vmax:      327.00 cm/s  AV Vmean:     237.000 cm/s  AV VTI:      0.678 m  AV Peak Grad:   42.8 mmHg  AV Mean Grad:   26.0 mmHg  LVOT Vmax:     122.00 cm/s  LVOT Vmean:    89.600 cm/s  LVOT VTI:     0.278 m  LVOT/AV VTI ratio: 0.41  AI PHT:      140 msec    AORTA  Ao Root diam: 2.70 cm  Ao Asc diam: 4.15 cm   MITRAL VALVE        TRICUSPID VALVE  MV Area (PHT): 3.42 cm   TR Peak grad:  24.6 mmHg  MV Decel Time: 222 msec   TR Vmax:    248.00 cm/s  MV E velocity: 112.00 cm/s               SHUNTS               Systemic VTI: 0.28 m               Systemic Diam: 2.10 cm   _________________    12/11/2019 RIGHT HEART CATH AND CORONARY/GRAFT ANGIOGRAPHY  Conclusion   Severe pulmonary hypertension secondary to acute diastolic heart failure secondary to acute aortic regurgitation.  Widely patent left main, RCA, with 50 to 60% ostial circumflex and 50 to 60% mid LAD stenoses.  Severe bioprosthetic valve regurgitation.  RECOMMENDATIONS:   We will speak with Dr. [name] concerning management of this patient's aortic  bioprosthetic dysfunction with severe aortic regurgitation.  Currently out of heart failure and feeling back to baseline.     _________________    Carotid duplex 12/13/19 Summary:  Right Carotid: Velocities in  the right ICA are consistent with a 1-39%  stenosis.   Left Carotid: Velocities in the left ICA are consistent with a 1-39%  stenosis.   Vertebrals: Bilateral vertebral arteries demonstrate antegrade flow.    _________________   Cardiac CT and CTA chest/abd/pelvis 12/13/19 EXAM: Cardiac TAVR CT  TECHNIQUE: The patient was scanned on a [employer]. A 120 kV retrospective scan was triggered in the descending thoracic aorta at 111 HU's. Gantry rotation speed was 250 msecs and collimation was .6 mm. No beta blockade or nitro were given. The 3D data set was reconstructed in 5% intervals of the R-R cycle. Systolic and diastolic phases were analyzed on a dedicated work station using MPR, MIP and VRT modes. The patient received 80 cc of contrast.  FINDINGS: Image quality: Excellent.  Noise artifact is: Limited.  Aortic Valve Prosthesis: The aortic valve has been replaced with a 25 mm [employer] 3000 pericardial tissue valve which was performed on 10/17/2006 per the medical record. 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 The leaflets of the prosthetic valve are thickened and there is severe prolapse of the leaflet of the LCC consistent with severe prosthetic valve regurgitation. There is no evidence of vegetation or aortic root abscess. There is no apparent paravalvular leak.  Virtual Transcatheter Heart Valve (THV) to Coronary Distance (VTC): Modeling was performed using a 26 mm Edwards Sapien 3 valve as recommended per the ViV application.  RCA VTC: 4.1 mm. The RCA originates at the tip of the post of the virtual THV.  LCA VTC: 6.7 mm. The LCA originates below the tip of the post of the virtual THV.  Subannular calcification: No significant sub-annular calcification below the sewing ring.  Optimal coplanar projection: LAO 15 CRA 6  Sinus of Valsalva Measurements: Asymmetric due to native bicuspid valve  diease.  Non-coronary: 36 mm  Right-coronary: 32 mm  Left-coronary: 36 mm  Sinus of Valsalva Height:  Non-coronary: 18 mm  Right-coronary: 21 mm  Left-coronary: 18.7 mm  Sinotubular Junction: 34 mm.  Minimal calcified plaque.  Ascending Thoracic Aorta: The ascending aorta measures up to 41 mm. No significant plaque.  Coronary Arteries: Normal coronary origin. Right dominance. The study was performed without use of NTG and is insufficient for plaque evaluation.  Cardiac Morphology:  Right Atrium: Right atrial size is within normal limits.  Right Ventricle: The right ventricular cavity is within normal limits.  Left Atrium: Left atrial size is normal in size with no left atrial appendage filling defect.  Left Ventricle: The ventricular cavity size is within normal limits. There are no stigmata of prior infarction. There is no abnormal filling defect. LVEF=63%. No regional wall motion abnormalities.  Pulmonary arteries: Normal in size without proximal filling defect.  Pulmonary veins: Normal pulmonary venous drainage.  Pericardium: Normal thickness with no significant effusion or calcium present.  Mitral Valve: The mitral valve is normal structure without significant calcification.  Extra-cardiac findings: See attached radiology report for non-cardiac structures.  IMPRESSION: 1. 25 mm Edwards Magna 3000 pericardial bioprosthetic valve is present in the aortic position with severe prolapse of the LCC leaflet suggestive of severe prosthetic valve regurgitation.  2. 26 mm Edwards Sapien 3 recommended per ViV application.  3. RCA VTC is borderline (4.1 mm)  but the RCA originates at the tip of the post of the virtual THV.  4. LCA VTC is sufficient (6.7 mm).  5. Optimal Fluoroscopic Angle for Delivery: LAO 15 CRA 6.  <CurrentUser>, MD  Electronically Signed: By: Eleonore Chiquito On: 12/15/2019 15:08    EXAM: CT ANGIOGRAPHY  CHEST, ABDOMEN AND PELVIS  TECHNIQUE: Non-contrast CT of the chest was initially obtained.  Multidetector CT imaging through the chest, abdomen and pelvis was performed using the standard protocol during bolus administration of intravenous contrast. Multiplanar reconstructed images and MIPs were obtained and reviewed to evaluate the vascular anatomy.  CONTRAST:  137mL OMNIPAQUE IOHEXOL 350 MG/ML SOLN  COMPARISON:  11/14/2019 chest CT angiogram.  FINDINGS: CTA CHEST FINDINGS  Cardiovascular: Mild cardiomegaly. Aortic valve prosthesis in place. No significant pericardial effusion/thickening. Atherosclerotic nonaneurysmal thoracic aorta. Normal caliber pulmonary arteries. No central pulmonary emboli.  Mediastinum/Nodes: No discrete thyroid nodules. Unremarkable esophagus. No axillary adenopathy. Stable enlarged 1.6 cm right paratracheal node (series 4/image 38). Stable enlarged 1.1 cm subcarinal node (series 4/image 51). Stable enlarged 1.2 cm right hilar node (series 4/image 45). No pathologically enlarged left hilar nodes.  Lungs/Pleura: No pneumothorax. Small dependent right pleural effusion. Trace dependent left pleural effusion. No acute consolidative airspace disease, lung masses or significant pulmonary nodules. Mild interlobular septal thickening in the lower lungs, right greater than left. Mild patchy right upper lobe ground-glass opacity, substantially decreased from prior.  Musculoskeletal: No aggressive appearing focal osseous lesions. Intact sternotomy wires. Mild thoracic spondylosis.  CTA ABDOMEN AND PELVIS FINDINGS  Hepatobiliary: Normal liver with no liver mass. Normal gallbladder with no radiopaque cholelithiasis. No biliary ductal dilatation.  Pancreas: Normal, with no mass or duct dilation.  Spleen: Normal size. No mass.  Adrenals/Urinary Tract: Normal adrenals. No contour deforming renal masses. No hydronephrosis. Normal  bladder.  Stomach/Bowel: Normal non-distended stomach. Normal caliber small bowel with no small bowel wall thickening. Candidate diminutive normal appendix. Normal large bowel with no diverticulosis, large bowel wall thickening or pericolonic fat stranding.  Vascular/Lymphatic: Mildly atherosclerotic nonaneurysmal abdominal aorta. No pathologically enlarged lymph nodes in the abdomen or pelvis.  Reproductive: Moderate prostatomegaly with mass-effect on the bladder base by the enlarged nodular median lobe of the prostate.  Other: No pneumoperitoneum, ascites or focal fluid collection.  Musculoskeletal: No aggressive appearing focal osseous lesions. Moderate lumbar spondylosis.  VASCULAR MEASUREMENTS PERTINENT TO TAVR:  AORTA:  Minimal Aortic Diameter-18.2 x 15.5 mm  Severity of Aortic Calcification-mild  RIGHT PELVIS:  Right Common Iliac Artery -  Minimal Diameter-11.8 x 11.7 mm  Tortuosity-moderate  Calcification-mild  Right External Iliac Artery -  Minimal Diameter-9.2 x 9.1 mm  Tortuosity-mild-to-moderate  Calcification-none  Right Common Femoral Artery -  Minimal Diameter-10.1 x 9.6 mm  Tortuosity-mild  Calcification-mild  LEFT PELVIS:  Left Common Iliac Artery -  Minimal Diameter-11.2 x 10.6 mm  Tortuosity-mild  Calcification-mild  Left External Iliac Artery -  Minimal Diameter-9.6 x 9.6 mm  Tortuosity-mild  Calcification-none  Left Common Femoral Artery -  Minimal Diameter-9.7 x 9.4 mm  Tortuosity-mild  Calcification-none  Review of the MIP images confirms the above findings.  IMPRESSION: 1. Vascular findings and measurements pertinent to potential TAVR procedure, as detailed. Aortic valve prosthesis in place. 2. Mild cardiomegaly. 3. Small dependent right pleural effusion. Trace dependent left pleural effusion. 4. Mild interlobular septal thickening in the lower lungs and mild patchy  ground-glass opacity in the right upper lobe, suggesting mild pulmonary edema, improved since 11/14/2019 chest CT. 5. Stable mild mediastinal and right  hilar adenopathy, nonspecific, probably reactive. 6. Moderate prostatomegaly. 7.  Aortic Atherosclerosis (ICD10-I70.0).   Electronically Signed   By: Ilona Sorrel M.D.   On: 12/16/2019 08:57   ________________   STS RISK SCORE Procedure: Isolated AVR   Risk of Mortality: 2.401% Renal Failure: 3.353% Permanent Stroke: 1.121% Prolonged Ventilation: 9.133% DSW Infection: 0.160% Reoperation: 5.711% Morbidity or Mortality: 14.422% Short Length of Stay: 37.049% Long Length of Stay: 5.481%    _______________  [facility] Cardiomyopathy Questionnaire  KCCQ-12 12/13/2019  1 a. Ability to shower/bathe Slightly limited  1 b. Ability to walk 1 block Extremely limited  1 c. Ability to hurry/jog Extremely limited  2. Edema feet/ankles/legs Every morning  3. Limited by fatigue All of the time  4. Limited by dyspnea All of the time  5. Sitting up / on 3+ pillows Every night  6. Limited enjoyment of life Extremely limited  7. Rest of life w/ symptoms Not at all satisfied  8 a. Participation in hobbies Severely limited  8 b. Participation in chores Severely limited  8 c. Visiting family/friends Severely limited      Patient Profile     Douglas Hart is a 74 y.o. male with a history of bioprosthetic aortic valve replacement in 2008 by Dr. [name] with a 25 mm [place]den valve,post-op atrial flutter, HTN, HLD, OSA on CPAP, and recent admission to [facility] with possible pneumonia and bilateral pleural effusion s/p thoracentesis, who was directly admitted from clinic on 12/09/19 for acute on chronic diastolic CHF. He was found to have severe bioprosthetic aortic valve dysfunction with severe aortic insufficiency. The structural heart team was consulted for valve in valve TAVR.   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 He has diuresed 8Ls since admission.   We have personally reviewed his 2D echocardiogram, cardiac catheterization, and CTA studies. Echo showed LVEF=65-70%, mild LVH. The bioprosthetic AVR found to be degenerative with severe aortic insufficiency and moderate aortic stenosis (mean gradient 26 mmHg, peak gradient 42.8 mmHg). Cardiac cath 12/11/19 with moderate non-obstructive CAD (50-60% ostial Circumflex, 50-60% mid LAD stenosis).   Given his advanced age and previous surgical valve replacement, he would not be a good surgical candidate and valve in valve TAVR would be the best treatment for him.  His gated cardiac CTA shows anatomy suitable for TAVR using a 26 mm Edwards Sapien 3 Ultra valve. His abdominal and pelvic CTA shows adequate pelvic vascular anatomy to allow transfemoral insertion.   The patient and his significant other were counseled at length regarding treatment alternatives for management of severe symptomatic aortic stenosis. The risks and benefits of surgical intervention has been discussed in detail. Long-term prognosis with medical therapy was discussed. Alternative approaches such as conventional surgical aortic valve replacement, transcatheter aortic valve replacement, and palliative medical therapy were compared and contrasted at length. This discussion was placed in the context of the patient's own specific clinical presentation and past medical history. All of their questions have been addressed.   Following the decision to proceed with transcatheter aortic valve replacement, a discussion was held regarding what types of management strategies would be attempted intraoperatively in the event of life-threatening complications, including whether or not the patient would be  considered a candidate for the use of cardiopulmonary bypass and/or  conversion to open sternotomy for attempted surgical intervention. I think he would be a candidate for emergent sternotomy if needed to manage any intraoperative complications. The patient has been advised of a variety of complications that might develop including but not limited to risks of death, stroke, paravalvular leak, aortic dissection or other major vascular complications, aortic annulus rupture, device embolization, cardiac rupture or perforation, mitral regurgitation, acute myocardial infarction, arrhythmia, heart block or bradycardia requiring permanent pacemaker placement, congestive heart failure, respiratory failure, renal failure, pneumonia, infection, other late complications related to structural valve deterioration or migration, or other complications that might ultimately cause a temporary or permanent loss of functional independence or other long term morbidity. The patient provides full informed consent for the procedure as described and all questions were answered.   TAVR tentatively scheduled for 4th case tomorrow at 78 noon. Pre op orders placed.    Dr Cyndia Bent to follow.   Signed, Angelena Form, PA-C  12/16/2019, 12:39 PM  Pager [phone]  I have personally reviewed his medical record, echocardiogram, cardiac catheterization, and CTA studies and have examined the patient.  He had a 25 mm Edwards Magna pericardial valve, model 3000 placed in 2008.  He says that he felt fine until 6 days following his COVID-19 vaccine when he developed progressive shortness of breath and fluid retention and was admitted with acute on chronic diastolic congestive heart failure.  2D echocardiogram showed severe prosthetic valve aortic insufficiency with a mean gradient across aortic valve of 26 mmHg.  Left ventricular systolic function was normal.  There was trivial mitral regurgitation.  Cardiac catheterization showed 50 to 60% ostial  left circumflex stenosis and 50 to 60% mid LAD stenosis.  There was severe pulmonary hypertension with PA pressure of 68/32.  He has responded well to diuresis and said that he felt the best that he has about 3 days ago.  Over the last 2 days he said that he has had some orthopnea when laying down has been sleeping sitting up in the chair.  I agree that valve in valve transcatheter aortic valve replacement is indicated in this patient.  His 25 mm Edwards 3000 pericardial valve is suitable for a 26 mm SAPIEN 3 valve.  His gated cardiac CTA shows relatively large sinuses of Valsalva and I think the coronary ostia should be okay.  The abdominal and pelvic CTA shows adequate pelvic vascular anatomy as well transfemoral insertion.  The patient was counseled at length regarding treatment alternatives for management of severe symptomatic prosthetic aortic valve insufficiency. The risks and benefits of surgical intervention has been discussed in detail. Long-term prognosis with medical therapy was discussed. Alternative approaches such as conventional redo surgical aortic valve replacement, transcatheter aortic valve replacement, and palliative medical therapy were compared and contrasted at length. This discussion was placed in the context of the patient's own specific clinical presentation and past medical history. All of his questions have been addressed. The patient is eager to proceed with surgical management in the morning.   Following the decision to proceed with transcatheter aortic valve replacement, a discussion was held regarding what types of management strategies would be attempted intraoperatively in the event of life-threatening complications, including whether or not the patient would be considered a candidate for the use of cardiopulmonary bypass and/or conversion to open sternotomy for attempted surgical intervention. The patient is aware of the fact that transient use of cardiopulmonary bypass may be  necessary. Since he has had prior AVR I think an emergent redo  sternotomy to manage any intraop complication would be very high risk and likely result in a poor outcome but he is a low risk surgical patient so I would still consider him a candidate for rescue surgery if needed.   The patient has been advised of a variety of complications that might develop including but not limited to risks of death, stroke, paravalvular leak, aortic dissection or other major vascular complications, aortic annulus rupture, device embolization, cardiac rupture or perforation, mitral regurgitation, acute myocardial infarction, arrhythmia, heart block or bradycardia requiring permanent pacemaker placement, congestive heart failure, respiratory failure, renal failure, pneumonia, infection, other late complications related to structural valve deterioration or migration, or other complications that might ultimately cause a temporary or permanent loss of functional independence or other long term morbidity. The patient provides full informed consent for the procedure as described and all questions were answered.    Gaye Pollack, MD

## 2019-12-16 NOTE — Progress Notes (Signed)
Physical Therapy Treatment Patient Details Name: Douglas Hart MRN: [id number] DOB: 12-26-1945 Today's Date: 12/16/2019    History of Present Illness 74 y.o. 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 Presented to ED w/ c/o CHF and cardiac valve problems. 3/4- 11/17/19 hospitalized w/ respiratory distress. Pt found to have acute on chronic CHF, orthopnea, PND, BLE swelling, and bioprosthetic aortic valve dysfunction. Also admitted for IV diuresis. s/p right/left heart cath and coronary angiography 3/31.    PT Comments    Pt OOB in recliner upon arrival of PT, has been up ambulating in the hallway already this morning using a RW to reduce pain in his L knee. The pt was able to demo good stability with ambulation both with and without use of RW, but scored 17/24 on the DGI due to his preferred use of RW during the assessment for comfort (scores < 19/24 indicate increased risk of falls). The pt demo good motivation and tolerance for return to activity and will progress well with PT. We will plan to re-assess d/c needs following TAVR planned for tomorrow (4/6).     Follow Up Recommendations  No PT follow up(re-assess after procedure)     Equipment Recommendations  None recommended by PT(re-assess after procedure)    Recommendations for Other Services       Precautions / Restrictions Precautions Precautions: Fall Restrictions Weight Bearing Restrictions: No    Mobility  Bed Mobility Overal bed mobility: Modified Independent                Transfers Overall transfer level: Modified independent                  Ambulation/Gait Ambulation/Gait assistance: Modified independent (Device/Increase time) Gait Distance (Feet): 300 Feet(300 ft in hall with RW, 20 ft in room without AD) Assistive device: Rolling walker (2 wheeled);None Gait Pattern/deviations: Step-through pattern;Trunk flexed Gait  velocity: 0.5 m/s Gait velocity interpretation: <1.8 ft/sec, indicate of risk for recurrent falls General Gait Details: pt using RW due to pain in L knee with continued activity. Pt able to ambulate without use of AD but reports that UE support is less painful on knee.   Stairs             Wheelchair Mobility    Modified Rankin (Stroke Patients Only)       Balance Overall balance assessment: No apparent balance deficits (not formally assessed)                               Standardized Balance Assessment Standardized Balance Assessment : Dynamic Gait Index   Dynamic Gait Index Level Surface: Mild Impairment Change in Gait Speed: Mild Impairment Gait with Horizontal Head Turns: Mild Impairment Gait with Vertical Head Turns: Mild Impairment Gait and Pivot Turn: Normal Step Over Obstacle: Mild Impairment Step Around Obstacles: Mild Impairment Steps: Mild Impairment Total Score: 17      Cognition Arousal/Alertness: Awake/alert Behavior During Therapy: WFL for tasks assessed/performed Overall Cognitive Status: Within Functional Limits for tasks assessed                                        Exercises      General Comments        Pertinent Vitals/Pain Pain Assessment: 0-10 Pain Score: 2  Pain Location: L knee (arthritis) Pain Descriptors / Indicators: Aching;Sore Pain Intervention(s): Limited activity within patient's tolerance;Monitored during session    Home Living                      Prior Function            PT Goals (current goals can now be found in the care plan section) Acute Rehab PT Goals Patient Stated Goal: "Whatever i can do to get back into exercising i'm gonna do it" PT Goal Formulation: With patient Time For Goal Achievement: 12/27/19 Potential to Achieve Goals: Good Progress towards PT goals: Progressing toward goals    Frequency    Min 3X/week      PT Plan Current plan remains  appropriate    Co-evaluation              AM-PAC PT "6 Clicks" Mobility   Outcome Measure  Help needed turning from your back to your side while in a flat bed without using bedrails?: None Help needed moving from lying on your back to sitting on the side of a flat bed without using bedrails?: None Help needed moving to and from a bed to a chair (including a wheelchair)?: None Help needed standing up from a chair using your arms (e.g., wheelchair or bedside chair)?: None Help needed to walk in hospital room?: None Help needed climbing 3-5 steps with a railing? : A Little 6 Click Score: 23    End of Session Equipment Utilized During Treatment: Gait belt(pt using L knee sleeve for comfort) Activity Tolerance: Patient tolerated treatment well Patient left: in chair;with call bell/phone within reach Nurse Communication: Mobility status PT Visit Diagnosis: Other abnormalities of gait and mobility (R26.89)     Time: [id number] PT Time Calculation (min) (ACUTE ONLY): 22 min  Charges:  $Gait Training: 8-22 mins                     Karma Ganja, PT, DPT   Acute Rehabilitation Department Pager #: [phone]   Otho Bellows 12/16/2019, 1:04 PM

## 2019-12-16 NOTE — Progress Notes (Addendum)
Progress Note  Patient Name: Douglas Hart Date of Encounter: 12/16/2019  Primary Cardiologist: [name], MD   Subjective   Doing well, no complaints this morning.   Inpatient Medications    Scheduled Meds: . aspirin  81 mg Oral Daily  . atorvastatin  40 mg Oral q1800  . enoxaparin (LOVENOX) injection  40 mg Subcutaneous Q24H  . furosemide  40 mg Oral Daily  . Living Better with Heart Failure Book   Does not apply Once  . metoprolol succinate  50 mg Oral Daily  . potassium chloride  40 mEq Oral BID  . sodium chloride flush  3 mL Intravenous Q12H  . sodium chloride flush  3 mL Intravenous Q12H  . sodium chloride flush  3 mL Intravenous Q12H   Continuous Infusions: . sodium chloride    . sodium chloride     PRN Meds: sodium chloride, sodium chloride, acetaminophen, ondansetron (ZOFRAN) IV, sodium chloride flush, sodium chloride flush   Vital Signs    Vitals:   12/15/19 1423 12/15/19 1932 12/16/19 0330 12/16/19 0828  BP: (!) 111/53 128/66 (!) 124/54 (!) 141/57  Pulse: 78 75 71   Resp: 18 16 18    Temp: 98.1 F (36.7 C) (!) 97.5 F (36.4 C) 98 F (36.7 C)   TempSrc: Oral Oral Oral   SpO2: 98% 98% 96%   Weight:   107.8 kg   Height:        Intake/Output Summary (Last 24 hours) at 12/16/2019 0916 Last data filed at 12/16/2019 0800 Gross per 24 hour  Intake 705 ml  Output 1925 ml  Net -1220 ml   Last 3 Weights 12/16/2019 12/15/2019 12/14/2019  Weight (lbs) 237 lb 11.2 oz 250 lb 237 lb 10.5 oz  Weight (kg) 107.82 kg 113.4 kg 107.8 kg      Telemetry    SR - Personally Reviewed  ECG    No new tracing  Physical Exam  Pleasant older WM GEN: No acute distress.   Neck: No JVD Cardiac: RRR, + diastolic/systolic murmur, no rubs, or gallops.  Respiratory: Clear to auscultation bilaterally. GI: Soft, nontender, non-distended  MS: No edema; No deformity. Neuro:  Nonfocal  Psych: Normal affect   Labs    High Sensitivity Troponin:  No results for input(s):  TROPONINIHS in the last 720 hours.    Chemistry Recent Labs  Lab 12/09/19 2053 12/10/19 0435 12/14/19 0351 12/15/19 0401 12/16/19 0325  NA 140   < > 141 140 141  K 3.8   < > 3.6 3.2* 3.4*  CL 108   < > 105 105 106  CO2 21*   < > 25 24 24   GLUCOSE 124*   < > 95 95 159*  BUN 19   < > 28* 25* 24*  CREATININE 1.13   < > 1.33* 1.20 1.31*  CALCIUM 9.4   < > 9.3 9.2 9.4  PROT 6.5  --   --   --   --   ALBUMIN 3.7  --   --   --   --   AST 53*  --   --   --   --   ALT 62*  --   --   --   --   ALKPHOS 67  --   --   --   --   BILITOT 1.1  --   --   --   --   GFRNONAA >60   < > 52* 59* 53*  GFRAA >60   < > >60 >60 >60  ANIONGAP 11   < > 11 11 11    < > = values in this interval not displayed.     Hematology Recent Labs  Lab 12/10/19 0435 12/10/19 0435 12/11/19 0215 12/11/19 1605 12/12/19 0403  WBC 5.6  --  5.7  --  6.3  RBC 3.43*  --  3.67*  --  3.56*  HGB 11.0*   < > 11.8* 10.9*  10.9* 11.4*  HCT 33.0*   < > 35.8* 32.0*  32.0* 34.6*  MCV 96.2  --  97.5  --  97.2  MCH 32.1  --  32.2  --  32.0  MCHC 33.3  --  33.0  --  32.9  RDW 14.6  --  14.8  --  14.6  PLT 118*  --  131*  --  129*   < > = values in this interval not displayed.    BNP Recent Labs  Lab 12/09/19 2053 12/12/19 1038  BNP 988.3* 884.0*     DDimer No results for input(s): DDIMER in the last 168 hours.   Radiology    No results found.  Cardiac Studies   Cath: 12/11/19   Severe pulmonary hypertension secondary to acute diastolic heart failure secondary to acute aortic regurgitation.  Widely patent left main, RCA, with 50 to 60% ostial circumflex and 50 to 60% mid LAD stenoses.  Severe bioprosthetic valve regurgitation.  RECOMMENDATIONS:   We will speak with Dr. [name] concerning management of this patient's aortic bioprosthetic dysfunction with severe aortic regurgitation.  Currently out of heart failure and feeling back to baseline.  Diagnostic Dominance: Right   Patient Profile      74 y.o. male with a hx of bioprosthetic aortic valve replacement in 2008 by Dr. [name] atrial flutter,hypertension, hyperlipidemiaand recent admission to [name] 11/14/19 with pneumonia who directly admitted from clinic for acute on chronic combined CHF.  Assessment & Plan    1. Acute on chronic combined CHF:BNP 988.Net -9.7L. Weight is down. Breathing is stable. Underwent R/LHC noted above with no significant coronary disease.  - BNP slightly improved to 884. Diuresed less than 1L negative overnight, overall 8L negative. Creatinine stable/improved - now 1.2 today. -switched from IV to po lasix 40 mg daily -Continue BB  2. S/p bioprosthetic aortic valve replacement:echo noted above withvalve area, by VTI measures 1.42 cm. Aortic valve mean gradient measures 26.0 mmHg. Aortic valve Vmax measures 3.27 m/s. -underwent cath. Dr. [name] was consulted with recommendations for TAVR. This was discussed with the patient, who is agreeable. Seen by TAVR team with plans for TAVR on 4/6. Will remain inpatient until then.  3. HTN - Bp stable on current medications.    4. HLD - LDL 105, now on statin  5. Hypokalemia - K+ 3.4. Replete, Mag 2.2   For questions or updates, please contact [name] Please consult [email or web address] for contact info under        Signed, [name], NP  12/16/2019, 9:16 AM    Patient seen, examined. Available data reviewed. Agree with findings, assessment, and plan as outlined by [name], NP.  On my exam today, the patient is alert, oriented, in no distress.  JVP is normal, lungs are clear, heart is regular rate and rhythm with a 3/6 harsh systolic murmur at the right upper sternal border, abdomen is soft and nontender, extremities with trace edema.  Skin is warm and dry without rash.  All diagnostic data is  reviewed.  I reviewed the consultation notes from the multidisciplinary heart valve team.  Plans for  valve-in-valve TAVR tomorrow.  I reviewed the patient's CTA studies with him and he appears to have transfemoral access.  All of his questions are answered.  No changes are made in his medical regimen.  [name], M.D. 12/16/2019 12:52 PM

## 2019-12-17 ENCOUNTER — Encounter (HOSPITAL_COMMUNITY)
Admission: AD | Disposition: A | Discharge status: Home / Self Care | Payer: BC Managed Care – PPO | Source: Home / Self Care | Attending: Interventional Cardiology

## 2019-12-17 ENCOUNTER — Inpatient Hospital Stay (HOSPITAL_COMMUNITY): Payer: BC Managed Care – PPO | Admitting: Certified Registered"

## 2019-12-17 ENCOUNTER — Other Ambulatory Visit: Payer: Self-pay | Admitting: Physician Assistant

## 2019-12-17 ENCOUNTER — Inpatient Hospital Stay (HOSPITAL_COMMUNITY): Payer: BC Managed Care – PPO

## 2019-12-17 ENCOUNTER — Other Ambulatory Visit: Payer: Self-pay

## 2019-12-17 ENCOUNTER — Encounter (HOSPITAL_COMMUNITY): Payer: Self-pay | Admitting: Interventional Cardiology

## 2019-12-17 DIAGNOSIS — T82897D Other specified complication of cardiac prosthetic devices, implants and grafts, subsequent encounter: Secondary | ICD-10-CM

## 2019-12-17 DIAGNOSIS — I350 Nonrheumatic aortic (valve) stenosis: Secondary | ICD-10-CM

## 2019-12-17 DIAGNOSIS — Z006 Encounter for examination for normal comparison and control in clinical research program: Secondary | ICD-10-CM

## 2019-12-17 DIAGNOSIS — Z952 Presence of prosthetic heart valve: Secondary | ICD-10-CM

## 2019-12-17 HISTORY — PX: TRANSCATHETER AORTIC VALVE REPLACEMENT, TRANSFEMORAL: SHX6400

## 2019-12-17 HISTORY — DX: Presence of prosthetic heart valve: Z95.2

## 2019-12-17 HISTORY — PX: TEE WITHOUT CARDIOVERSION: SHX5443

## 2019-12-17 LAB — POCT I-STAT, CHEM 8
BUN: 26 mg/dL — ABNORMAL HIGH (ref 8–23)
BUN: 27 mg/dL — ABNORMAL HIGH (ref 8–23)
BUN: 29 mg/dL — ABNORMAL HIGH (ref 8–23)
BUN: 27 mg/dL — ABNORMAL HIGH (ref 8–23)
Calcium, Ion: 1.24 mmol/L (ref 1.15–1.40)
Calcium, Ion: 1.21 mmol/L (ref 1.15–1.40)
Calcium, Ion: 1.22 mmol/L (ref 1.15–1.40)
Calcium, Ion: 1.27 mmol/L (ref 1.15–1.40)
Chloride: 110 mmol/L (ref 98–111)
Chloride: 106 mmol/L (ref 98–111)
Chloride: 107 mmol/L (ref 98–111)
Chloride: 106 mmol/L (ref 98–111)
Creatinine, Ser: 1.1 mg/dL (ref 0.61–1.24)
Creatinine, Ser: 1.1 mg/dL (ref 0.61–1.24)
Creatinine, Ser: 1.2 mg/dL (ref 0.61–1.24)
Creatinine, Ser: 1.2 mg/dL (ref 0.61–1.24)
Glucose, Bld: 88 mg/dL (ref 70–99)
Glucose, Bld: 106 mg/dL — ABNORMAL HIGH (ref 70–99)
Glucose, Bld: 106 mg/dL — ABNORMAL HIGH (ref 70–99)
Glucose, Bld: 106 mg/dL — ABNORMAL HIGH (ref 70–99)
HCT: 32 % — ABNORMAL LOW (ref 39.0–52.0)
HCT: 30 % — ABNORMAL LOW (ref 39.0–52.0)
HCT: 31 % — ABNORMAL LOW (ref 39.0–52.0)
HCT: 30 % — ABNORMAL LOW (ref 39.0–52.0)
Hemoglobin: 10.9 g/dL — ABNORMAL LOW (ref 13.0–17.0)
Hemoglobin: 10.2 g/dL — ABNORMAL LOW (ref 13.0–17.0)
Hemoglobin: 10.5 g/dL — ABNORMAL LOW (ref 13.0–17.0)
Hemoglobin: 10.2 g/dL — ABNORMAL LOW (ref 13.0–17.0)
Potassium: 4.5 mmol/L (ref 3.5–5.1)
Potassium: 4.5 mmol/L (ref 3.5–5.1)
Potassium: 5 mmol/L (ref 3.5–5.1)
Potassium: 4.4 mmol/L (ref 3.5–5.1)
Sodium: 142 mmol/L (ref 135–145)
Sodium: 140 mmol/L (ref 135–145)
Sodium: 141 mmol/L (ref 135–145)
Sodium: 141 mmol/L (ref 135–145)
TCO2: 26 mmol/L (ref 22–32)
TCO2: 28 mmol/L (ref 22–32)
TCO2: 28 mmol/L (ref 22–32)
TCO2: 27 mmol/L (ref 22–32)

## 2019-12-17 LAB — URINALYSIS, ROUTINE W REFLEX MICROSCOPIC
Bacteria, UA: NONE SEEN
Bilirubin Urine: NEGATIVE
Glucose, UA: NEGATIVE mg/dL
Hgb urine dipstick: NEGATIVE
Ketones, ur: NEGATIVE mg/dL
Leukocytes,Ua: NEGATIVE
Nitrite: NEGATIVE
Protein, ur: 30 mg/dL — AB
Specific Gravity, Urine: 1.02 (ref 1.005–1.030)
pH: 6 (ref 5.0–8.0)

## 2019-12-17 LAB — CBC
HCT: 35.7 % — ABNORMAL LOW (ref 39.0–52.0)
Hemoglobin: 12 g/dL — ABNORMAL LOW (ref 13.0–17.0)
MCH: 33 pg (ref 26.0–34.0)
MCHC: 33.6 g/dL (ref 30.0–36.0)
MCV: 98.1 fL (ref 80.0–100.0)
Platelets: 145 10*3/uL — ABNORMAL LOW (ref 150–400)
RBC: 3.64 MIL/uL — ABNORMAL LOW (ref 4.22–5.81)
RDW: 14.7 % (ref 11.5–15.5)
WBC: 5.1 10*3/uL (ref 4.0–10.5)
nRBC: 0 % (ref 0.0–0.2)

## 2019-12-17 LAB — BASIC METABOLIC PANEL
Anion gap: 11 (ref 5–15)
BUN: 23 mg/dL (ref 8–23)
CO2: 23 mmol/L (ref 22–32)
Calcium: 9.7 mg/dL (ref 8.9–10.3)
Chloride: 108 mmol/L (ref 98–111)
Creatinine, Ser: 1.26 mg/dL — ABNORMAL HIGH (ref 0.61–1.24)
GFR calc Af Amer: >60 mL/min (ref >60)
GFR calc non Af Amer: 55 mL/min — ABNORMAL LOW (ref >60)
Glucose, Bld: 97 mg/dL (ref 70–99)
Potassium: 4.1 mmol/L (ref 3.5–5.1)
Sodium: 142 mmol/L (ref 135–145)

## 2019-12-17 LAB — COMPREHENSIVE METABOLIC PANEL
ALT: 48 U/L — ABNORMAL HIGH (ref 0–44)
AST: 28 U/L (ref 15–41)
Albumin: 3.5 g/dL (ref 3.5–5.0)
Alkaline Phosphatase: 50 U/L (ref 38–126)
Anion gap: 10 (ref 5–15)
BUN: 25 mg/dL — ABNORMAL HIGH (ref 8–23)
CO2: 22 mmol/L (ref 22–32)
Calcium: 9.5 mg/dL (ref 8.9–10.3)
Chloride: 109 mmol/L (ref 98–111)
Creatinine, Ser: 1.3 mg/dL — ABNORMAL HIGH (ref 0.61–1.24)
GFR calc Af Amer: >60 mL/min (ref >60)
GFR calc non Af Amer: 53 mL/min — ABNORMAL LOW (ref >60)
Glucose, Bld: 97 mg/dL (ref 70–99)
Potassium: 4.1 mmol/L (ref 3.5–5.1)
Sodium: 141 mmol/L (ref 135–145)
Total Bilirubin: 1.1 mg/dL (ref 0.3–1.2)
Total Protein: 6.2 g/dL — ABNORMAL LOW (ref 6.5–8.1)

## 2019-12-17 LAB — APTT: aPTT: 35 seconds (ref 24–36)

## 2019-12-17 LAB — POCT ACTIVATED CLOTTING TIME
Activated Clotting Time: 136 seconds
Activated Clotting Time: 131 seconds
Activated Clotting Time: 312 seconds

## 2019-12-17 LAB — SURGICAL PCR SCREEN
MRSA, PCR: NEGATIVE
Staphylococcus aureus: NEGATIVE

## 2019-12-17 LAB — ECHOCARDIOGRAM LIMITED
Height: 73 in
Weight: 3809.6 oz

## 2019-12-17 LAB — HEMOGLOBIN A1C
Hgb A1c MFr Bld: 5.2 % (ref 4.8–5.6)
Mean Plasma Glucose: 102.54 mg/dL

## 2019-12-17 SURGERY — IMPLANTATION, AORTIC VALVE, TRANSCATHETER, FEMORAL APPROACH
Anesthesia: Monitor Anesthesia Care

## 2019-12-17 MED ORDER — PHENYLEPHRINE HCL-NACL 20-0.9 MG/250ML-% IV SOLN
0.0000 ug/min | INTRAVENOUS | Status: DC
  Filled 2019-12-17: qty 250

## 2019-12-17 MED ORDER — SODIUM CHLORIDE 0.9% FLUSH: 3.0000 mL | INTRAVENOUS | Status: DC | PRN

## 2019-12-17 MED ORDER — LIDOCAINE HCL (PF) 1 % IJ SOLN
INTRAMUSCULAR | Status: DC | PRN
  Administered 2019-12-17: 14:00:00 7 mL

## 2019-12-17 MED ORDER — PROTAMINE SULFATE 10 MG/ML IV SOLN
INTRAVENOUS | Status: DC | PRN
  Administered 2019-12-17: 160 mg via INTRAVENOUS

## 2019-12-17 MED ORDER — ACETAMINOPHEN 325 MG PO TABS
650.0000 mg | ORAL_TABLET | Freq: Four times a day (QID) | ORAL | Status: DC | PRN
  Administered 2019-12-18: 650 mg via ORAL
  Filled 2019-12-17: qty 2

## 2019-12-17 MED ORDER — NITROGLYCERIN IN D5W 200-5 MCG/ML-% IV SOLN: 0.0000 ug/min | INTRAVENOUS | Status: DC

## 2019-12-17 MED ORDER — ACETAMINOPHEN 650 MG RE SUPP: 650.0000 mg | Freq: Four times a day (QID) | RECTAL | Status: DC | PRN

## 2019-12-17 MED ORDER — ONDANSETRON HCL 4 MG/2ML IJ SOLN: 4.0000 mg | Freq: Four times a day (QID) | INTRAMUSCULAR | Status: DC | PRN

## 2019-12-17 MED ORDER — IOHEXOL 350 MG/ML SOLN
INTRAVENOUS | Status: AC
  Filled 2019-12-17: qty 1

## 2019-12-17 MED ORDER — FENTANYL CITRATE (PF) 100 MCG/2ML IJ SOLN
INTRAMUSCULAR | Status: DC | PRN
  Administered 2019-12-17: 50 ug via INTRAVENOUS

## 2019-12-17 MED ORDER — LIDOCAINE HCL 1 % IJ SOLN
INTRAMUSCULAR | Status: AC
  Filled 2019-12-17: qty 20

## 2019-12-17 MED ORDER — ONDANSETRON HCL 4 MG/2ML IJ SOLN
INTRAMUSCULAR | Status: DC | PRN
  Administered 2019-12-17: 4 mg via INTRAVENOUS

## 2019-12-17 MED ORDER — CLOPIDOGREL BISULFATE 75 MG PO TABS
75.0000 mg | ORAL_TABLET | Freq: Every day | ORAL | Status: DC
  Administered 2019-12-18: 75 mg via ORAL
  Filled 2019-12-17: qty 1

## 2019-12-17 MED ORDER — IOHEXOL 350 MG/ML SOLN: INTRAVENOUS | Status: DC | PRN

## 2019-12-17 MED ORDER — TRAMADOL HCL 50 MG PO TABS
50.0000 mg | ORAL_TABLET | ORAL | Status: DC | PRN
  Administered 2019-12-18: 01:00:00 100 mg via ORAL
  Filled 2019-12-17: qty 2

## 2019-12-17 MED ORDER — PROPOFOL 500 MG/50ML IV EMUL
INTRAVENOUS | Status: DC | PRN
  Administered 2019-12-17: 25 ug/kg/min via INTRAVENOUS

## 2019-12-17 MED ORDER — OXYCODONE HCL 5 MG PO TABS: 5.0000 mg | ORAL_TABLET | ORAL | Status: DC | PRN

## 2019-12-17 MED ORDER — CHLORHEXIDINE GLUCONATE 4 % EX LIQD
CUTANEOUS | Status: AC
  Filled 2019-12-17: qty 15

## 2019-12-17 MED ORDER — HEPARIN (PORCINE) IN NACL 1000-0.9 UT/500ML-% IV SOLN
INTRAVENOUS | Status: AC
  Filled 2019-12-17: qty 500

## 2019-12-17 MED ORDER — LACTATED RINGERS IV SOLN: INTRAVENOUS | Status: DC | PRN

## 2019-12-17 MED ORDER — SODIUM CHLORIDE 0.9 % IV SOLN: INTRAVENOUS | Status: AC

## 2019-12-17 MED ORDER — HEPARIN (PORCINE) IN NACL 1000-0.9 UT/500ML-% IV SOLN
INTRAVENOUS | Status: DC | PRN
  Administered 2019-12-17 (×4): 500 mL

## 2019-12-17 MED ORDER — SODIUM CHLORIDE 0.9% FLUSH
3.0000 mL | Freq: Two times a day (BID) | INTRAVENOUS | Status: DC
  Administered 2019-12-18: 08:00:00 3 mL via INTRAVENOUS

## 2019-12-17 MED ORDER — ASPIRIN 81 MG PO CHEW
81.0000 mg | CHEWABLE_TABLET | Freq: Every day | ORAL | Status: DC
  Administered 2019-12-18: 09:00:00 81 mg via ORAL
  Filled 2019-12-17: qty 1

## 2019-12-17 MED ORDER — MORPHINE SULFATE (PF) 2 MG/ML IV SOLN: 1.0000 mg | INTRAVENOUS | Status: DC | PRN

## 2019-12-17 MED ORDER — HEPARIN SODIUM (PORCINE) 1000 UNIT/ML IJ SOLN
INTRAMUSCULAR | Status: DC | PRN
  Administered 2019-12-17: 16000 [IU] via INTRAVENOUS

## 2019-12-17 MED ORDER — VANCOMYCIN HCL IN DEXTROSE 1-5 GM/200ML-% IV SOLN
1000.0000 mg | Freq: Once | INTRAVENOUS | Status: AC
  Administered 2019-12-18: 1000 mg via INTRAVENOUS
  Filled 2019-12-17: qty 200

## 2019-12-17 MED ORDER — SODIUM CHLORIDE 0.9 % IV SOLN: 250.0000 mL | INTRAVENOUS | Status: DC | PRN

## 2019-12-17 MED ORDER — MUPIROCIN 2 % EX OINT: 1.0000 "application " | TOPICAL_OINTMENT | Freq: Two times a day (BID) | CUTANEOUS | Status: DC

## 2019-12-17 MED ORDER — SODIUM CHLORIDE 0.9 % IV SOLN
1.5000 g | Freq: Two times a day (BID) | INTRAVENOUS | Status: DC
  Administered 2019-12-17 – 2019-12-18 (×2): 1.5 g via INTRAVENOUS
  Filled 2019-12-17 (×3): qty 1.5

## 2019-12-17 SURGICAL SUPPLY — 34 items
BAG SNAP BAND KOVER 36X36 (MISCELLANEOUS) ×6 IMPLANT
BLANKET WARM UNDERBOD FULL ACC (MISCELLANEOUS) ×3 IMPLANT
CABLE ADAPT PACING TEMP 12FT (ADAPTER) ×2 IMPLANT
CATH 26 ULTRA DELIVERY (CATHETERS) ×2 IMPLANT
CATH INFINITI 5FR ANG PIGTAIL (CATHETERS) ×4 IMPLANT
CATH INFINITI 6F AL2 (CATHETERS) ×2 IMPLANT
CATH S G BIP PACING (CATHETERS) ×2 IMPLANT
CLOSURE MYNX CONTROL 6F/7F (Vascular Products) ×2 IMPLANT
CRIMPER (MISCELLANEOUS) ×2 IMPLANT
DEVICE CLOSURE PERCLS PRGLD 6F (VASCULAR PRODUCTS) IMPLANT
DEVICE INFLATION ATRION QL2530 (MISCELLANEOUS) ×2 IMPLANT
ELECT DEFIB PAD ADLT CADENCE (PAD) ×2 IMPLANT
GUIDEWIRE SAFE TJ AMPLATZ EXST (WIRE) ×2 IMPLANT
KIT HEART LEFT (KITS) ×2 IMPLANT
KIT MICROPUNCTURE NIT STIFF (SHEATH) ×2 IMPLANT
PACK CARDIAC CATHETERIZATION (CUSTOM PROCEDURE TRAY) ×3 IMPLANT
PERCLOSE PROGLIDE 6F (VASCULAR PRODUCTS) ×6
SHEATH 14X36 EDWARDS (SHEATH) ×2 IMPLANT
SHEATH BRITE TIP 7FR 35CM (SHEATH) ×2 IMPLANT
SHEATH PINNACLE 6F 10CM (SHEATH) ×2 IMPLANT
SHEATH PINNACLE 8F 10CM (SHEATH) ×2 IMPLANT
SHEATH PROBE COVER 6X72 (BAG) ×2 IMPLANT
SLEEVE REPOSITIONING LENGTH 30 (MISCELLANEOUS) ×2 IMPLANT
STOPCOCK MORSE 400PSI 3WAY (MISCELLANEOUS) ×8 IMPLANT
TRANSDUCER W/STOPCOCK (MISCELLANEOUS) ×4 IMPLANT
TUBE CONN 8.8X1320 FR HP M-F (CONNECTOR) ×2 IMPLANT
TUBING ART PRESS 72  MALE/FEM (TUBING) ×6
TUBING ART PRESS 72 MALE/FEM (TUBING) IMPLANT
TUBING CIL FLEX 10 FLL-RA (TUBING) ×2 IMPLANT
VALVE 26 ULTRA SAPIEN KIT (Valve) ×2 IMPLANT
WIRE AMPLATZ SS-J .035X180CM (WIRE) ×2 IMPLANT
WIRE EMERALD 3MM-J .035X150CM (WIRE) ×2 IMPLANT
WIRE EMERALD 3MM-J .035X260CM (WIRE) ×2 IMPLANT
WIRE EMERALD ST .035X260CM (WIRE) ×2 IMPLANT

## 2019-12-17 NOTE — CV Procedure (Signed)
[facility]  TAVR OPERATIVE NOTE   Date of Procedure:  12/17/2019  Preoperative Diagnosis: Severe Aortic Stenosis   Postoperative Diagnosis: Same   Procedure:    Transcatheter Aortic Valve Replacement Valve in Valve- Transfemoral Approach  Edwards Sapien 3 THV (size 26 mm, model # [id number], serial # [id number])   Co-Surgeons:  Lauree Chandler, MD and Gaye Pollack, MD   Anesthesiologist:  Smith Robert  Echocardiographer:  Croitoru  Pre-operative Echo Findings:  Severe aortic stenosis  Normal left ventricular systolic function  Post-operative Echo Findings:  No paravalvular leak  Normal left ventricular systolic function  BRIEF CLINICAL NOTE AND INDICATIONS FOR SURGERY  [name] is a 74 yo male with history of aortic stenosis s/p bioprosthetic AVR in 2008, post-op atrial flutter, HTN, hyperlipidemia, asthma, sleep apnea on CPAP who has had recent progressive dyspnea on exertion and lower extremity edema. He notes feeling poorly for the past 4-6 weeks. He was admitted to [facility] on March 4,2021 with respiratory distress and was found to have bilateral pleural effusions. Thoracentesis performed on both lungs. He was also treated for a possible pneumonia. His dyspnea continued following discharge from the hospital. He was seen by Dr. Tamala Julian on 12/09/19 and was admitted for acute CHF. Echo 12/10/19 with LVEF=65-70%, mild LVH. The bioprosthetic AVR was found to be degenerative with severe aortic insufficiency and moderate aortic stenosis (mean gradient 26 mmHg, peak gradient 42.8 mmHg). Cardiac cath 12/11/19 with moderate non-obstructive CAD. (50-60% ostial Circumflex, 50-60% mid LAD stenosis). He has diuresed well with symptomatic improvement but still with lower extremity edema and orthopnea at night. The structural heart team is asked to evaluate for possible TAVR. His bioprosthetic AVR is an[employer] model 3000 pericardial tissue valve-serial number  [id number] 50m.  During the course of the patient's preoperative work up they have been evaluated comprehensively by a multidisciplinary team of specialists [facility].  They have been demonstrated to suffer from symptomatic severe aortic stenosis as noted above. The patient has been counseled extensively as to the relative risks and benefits of all options for the treatment of severe aortic stenosis including long term medical therapy, conventional surgery for aortic valve replacement, and transcatheter aortic valve replacement.  The patient has been independently evaluated by Dr. BCyndia Bentwith CT surgery and they are felt to be at high risk for conventional surgical aortic valve replacement. The surgeon indicated the patient would be a poor candidate for conventional surgery. Based upon review of all of the patient's preoperative diagnostic tests they are felt to be candidate for transcatheter aortic valve replacement using the transfemoral approach as an alternative to high risk conventional surgery.    Following the decision to proceed with transcatheter aortic valve replacement, a discussion has been held regarding what types of management strategies would be attempted intraoperatively in the event of life-threatening complications, including whether or not the patient would be considered a candidate for the use of cardiopulmonary bypass and/or conversion to open sternotomy for attempted surgical intervention.  The patient has been advised of a variety of complications that might develop peculiar to this approach including but not limited to risks of death, stroke, paravalvular leak, aortic dissection or other major vascular complications, aortic annulus rupture, device embolization, cardiac rupture or perforation, acute myocardial infarction, arrhythmia, heart block or bradycardia requiring permanent pacemaker  placement, congestive heart failure, respiratory failure, renal failure, pneumonia, infection, other late complications related  to structural valve deterioration or migration, or other complications that might ultimately cause a temporary or permanent loss of functional independence or other long term morbidity.  The patient provides full informed consent for the procedure as described and all questions were answered preoperatively.    DETAILS OF THE OPERATIVE PROCEDURE  PREPARATION:   The patient is brought to the operating room on the above mentioned date and central monitoring was established by the anesthesia team including placement of a radial arterial line. The patient is placed in the supine position on the operating table.  Intravenous antibiotics are administered. Conscious sedation is used.   Baseline transthoracic echocardiogram was performed. The patient's chest, abdomen, both groins, and both lower extremities are prepared and draped in a sterile manner. A time out procedure is performed.   PERIPHERAL ACCESS:   Using the modified Seldinger technique, femoral arterial and venous access were obtained with placement of 6 Fr sheaths on the left side using u/s guidance.  A pigtail diagnostic catheter was passed through the femoral arterial sheath under fluoroscopic guidance into the aortic root.  A temporary transvenous pacemaker catheter was passed through the femoral venous sheath under fluoroscopic guidance into the right ventricle.  The pacemaker was tested to ensure stable lead placement and pacemaker capture.   TRANSFEMORAL ACCESS:  A micropuncture kit was used to gain access to the right femoral artery using u/s guidance. Position confirmed with angiography. Pre-closure with double ProGlide closure devices. The patient was heparinized systemically and ACT verified > 250 seconds.    A 14 Fr transfemoral E-sheath was introduced into the right femoral artery after progressively  dilating over an Amplatz superstiff wire. An AL=2 catheter was used to direct a straight-tip exchange length wire across the native aortic valve into the left ventricle. This was exchanged out for a pigtail catheter and position was confirmed in the LV apex. The pigtail catheter was then exchanged for an Amplatz Extra-stiff wire in the LV apex.   TRANSCATHETER HEART VALVE DEPLOYMENT:  An Edwards Sapien 3 THV (size 53m) was prepared and crimped per manufacturer's guidelines, and the proper orientation of the valve is confirmed on the [employer] Corporationdelivery system. The valve was advanced through the introducer sheath using normal technique until in an appropriate position in the abdominal aorta beyond the sheath tip. The balloon was then retracted and using the fine-tuning wheel was centered on the valve. The valve was then advanced across the aortic arch using appropriate flexion of the catheter. The valve was carefully positioned across the aortic valve annulus. The Commander catheter was retracted using normal technique. Once final position of the valve has been confirmed inside the old valve, the valve is deployed while temporarily holding ventilation and during rapid ventricular pacing to maintain systolic blood pressure < 50 mmHg and pulse pressure < 10 mmHg. The balloon inflation is held for >3 seconds after reaching full deployment volume. Once the balloon has fully deflated the balloon is retracted into the ascending aorta and valve function is assessed using TTE. There is felt to be no paravalvular leak and no central aortic insufficiency.  The patient's hemodynamic recovery following valve deployment is good.  The deployment balloon and guidewire are both removed. Echo demostrated acceptable post-procedural gradients, stable mitral valve function, and no AI.   PROCEDURE COMPLETION:  The sheath was then removed and closure devices were completed. Protamine was administered once femoral arterial  repair was complete. The temporary pacemaker, pigtail catheters and femoral sheaths were removed with  Mynx closure device placed in the artery and manual pressure used for venous hemostasis.    The patient tolerated the procedure well and is transported to the surgical intensive care in stable condition. There were no immediate intraoperative complications. All sponge instrument and needle counts are verified correct at completion of the operation.   No blood products were administered during the operation.  The patient received a total of 0 mL of intravenous contrast during the procedure.  Lauree Chandler MD 12/17/2019 3:05 PM

## 2019-12-17 NOTE — Interval H&P Note (Signed)
History and Physical Interval Note:  12/17/2019 12:30 PM  Douglas Hart  has presented today for surgery, with the diagnosis of aortic stenosis.  The various methods of treatment have been discussed with the patient and family. After consideration of risks, benefits and other options for treatment, the patient has consented to  Procedure(s): TRANSCATHETER AORTIC VALVE REPLACEMENT, TRANSFEMORAL (N/A) TRANSESOPHAGEAL ECHOCARDIOGRAM (TEE) (N/A) as a surgical intervention.  The patient's history has been reviewed, patient examined, no change in status, stable for surgery.  I have reviewed the patient's chart and labs.  Questions were answered to the patient's satisfaction.     Lauree Chandler

## 2019-12-17 NOTE — Discharge Instructions (Signed)
ACTIVITY AND EXERCISE °• Daily activity and exercise are an important part of your recovery. People recover at different rates depending on their general health and type of valve procedure. °• Most people recovering from TAVR feel better relatively quickly  °• No lifting, pushing, pulling more than 10 pounds (examples to avoid: groceries, vacuuming, gardening, golfing): °            - For one week with a procedure through the groin. °            - For six weeks for procedures through the chest wall or neck. °NOTE: You will typically see one of our providers 7-14 days after your procedure to discuss WHEN TO RESUME the above activities.  °  °  °DRIVING °• Do not drive until you are seen for follow up and cleared by a provider. Generally, we ask patient to not drive for 1 week after their procedure. °• If you have been told by your doctor in the past that you may not drive, you must talk with him/her before you begin driving again. °  °DRESSING °• Groin site: you may leave the clear dressing over the site for up to one week or until it falls off. °  °HYGIENE °• If you had a femoral (leg) procedure, you may take a shower when you return home. After the shower, pat the site dry. Do NOT use powder, oils or lotions in your groin area until the site has completely healed. °• If you had a chest procedure, you may shower when you return home unless specifically instructed not to by your discharging practitioner. °            - DO NOT scrub incision; pat dry with a towel. °            - DO NOT apply any lotions, oils, powders to the incision. °            - No tub baths / swimming for at least 2 weeks. °• If you notice any fevers, chills, increased pain, swelling, bleeding or pus, please contact your doctor. °  °ADDITIONAL INFORMATION °• If you are going to have an upcoming dental procedure, please contact our office as you will require antibiotics ahead of time to prevent infection on your heart valve.  ° ° °If you have any  questions or concerns you can call the structural heart phone during normal business hours 8am-4pm. If you have an urgent need after hours or weekends please call [phone] to talk to the on call provider for general cardiology. If you have an emergency that requires immediate attention, please call 911.  ° ° °After TAVR Checklist ° °Check  Test Description  ° Follow up appointment in 1-2 weeks  You will see our structural heart physician assistant, Katie Nicoli Nardozzi. Your incision sites will be checked and you will be cleared to drive and resume all normal activities if you are doing well.    ° 1 month echo and follow up  You will have an echo to check on your new heart valve and be seen back in the office by Katie Mozell Haber. Many times the echo is not read by your appointment time, but Katie will call you later that day or the following day to report your results.  ° Follow up with your primary cardiologist You will need to be seen by your primary cardiologist in the following 3-6 months after your 1 month appointment in the valve   clinic. Often times your Plavix or Aspirin will be discontinued during this time, but this is decided on a case by case basis.   ° 1 year echo and follow up You will have another echo to check on your heart valve after 1 year and be seen back in the office by Katie Calee Nugent. This your last structural heart visit.  ° Bacterial endocarditis prophylaxis  You will have to take antibiotics for the rest of your life before all dental procedures (even teeth cleanings) to protect your heart valve. Antibiotics are also required before some surgeries. Please check with your cardiologist before scheduling any surgeries. Also, please make sure to tell us if you have a penicillin allergy as you will require an alternative antibiotic.   ° ° °

## 2019-12-17 NOTE — Progress Notes (Addendum)
Progress Note  Patient Name: Douglas Hart Date of Encounter: 12/17/2019  Primary Cardiologist: [name], MD   Subjective   Feeling well, planned for TAVR today.   Inpatient Medications    Scheduled Meds: . aspirin  81 mg Oral Daily  . atorvastatin  40 mg Oral q1800  . chlorhexidine  15 mL Mouth/Throat Once  . enoxaparin (LOVENOX) injection  40 mg Subcutaneous Q24H  . furosemide  40 mg Oral Daily  . Living Better with Heart Failure Book   Does not apply Once  . magnesium sulfate  40 mEq Other To OR  . metoprolol succinate  50 mg Oral Daily  . mupirocin ointment  1 application Nasal BID  . potassium chloride  80 mEq Other To OR  . sodium chloride flush  3 mL Intravenous Q12H   Continuous Infusions: . sodium chloride    . cefUROXime (ZINACEF)  IV    . dexmedetomidine    . heparin 30,000 units/NS 1000 mL solution for CELLSAVER    . norepinephrine (LEVOPHED) Adult infusion    . vancomycin     PRN Meds: sodium chloride, acetaminophen, ondansetron (ZOFRAN) IV, temazepam   Vital Signs    Vitals:   12/16/19 0828 12/16/19 1259 12/16/19 1733 12/17/19 0350  BP: (!) 141/57  (!) 129/91 (!) 140/49  Pulse:  (!) 111 73 73  Resp:   18 17  Temp:   98.2 F (36.8 C) 97.9 F (36.6 C)  TempSrc:   Oral Oral  SpO2:  98% 98% 96%  Weight:    108 kg  Height:        Intake/Output Summary (Last 24 hours) at 12/17/2019 0803 Last data filed at 12/17/2019 0356 Gross per 24 hour  Intake 669 ml  Output 2450 ml  Net -1781 ml   Last 3 Weights 12/17/2019 12/16/2019 12/15/2019  Weight (lbs) 238 lb 1.6 oz 237 lb 11.2 oz 250 lb  Weight (kg) 108.001 kg 107.82 kg 113.4 kg      Telemetry    SR - Personally Reviewed  ECG    No new tracing.   Physical Exam  Pleasant older WM GEN: No acute distress.   Neck: No JVD Cardiac: RRR, + systolic/diastolic murmur, no rubs, or gallops.  Respiratory: Clear to auscultation bilaterally. GI: Soft, nontender, non-distended  MS: No edema; No  deformity. Neuro:  Nonfocal  Psych: Normal affect   Labs    High Sensitivity Troponin:  No results for input(s): TROPONINIHS in the last 720 hours.    Chemistry Recent Labs  Lab 12/15/19 0401 12/16/19 0325 12/17/19 0316  NA 140 141 142  K 3.2* 3.4* 4.1  CL 105 106 108  CO2 24 24 23   GLUCOSE 95 159* 97  BUN 25* 24* 23  CREATININE 1.20 1.31* 1.26*  CALCIUM 9.2 9.4 9.7  GFRNONAA 59* 53* 55*  GFRAA >60 >60 >60  ANIONGAP 11 11 11      Hematology Recent Labs  Lab 12/11/19 0215 12/11/19 0215 12/11/19 1605 12/12/19 0403 12/17/19 0316  WBC 5.7  --   --  6.3 5.1  RBC 3.67*  --   --  3.56* 3.64*  HGB 11.8*   < > 10.9*  10.9* 11.4* 12.0*  HCT 35.8*   < > 32.0*  32.0* 34.6* 35.7*  MCV 97.5  --   --  97.2 98.1  MCH 32.2  --   --  32.0 33.0  MCHC 33.0  --   --  32.9 33.6  RDW 14.8  --   --  14.6 14.7  PLT 131*  --   --  129* 145*   < > = values in this interval not displayed.    BNP Recent Labs  Lab 12/12/19 1038  BNP 884.0*     DDimer No results for input(s): DDIMER in the last 168 hours.   Radiology    No results found.  Cardiac Studies   Cath: 12/11/19   Severe pulmonary hypertension secondary to acute diastolic heart failure secondary to acute aortic regurgitation.  Widely patent left main, RCA, with 50 to 60% ostial circumflex and 50 to 60% mid LAD stenoses.  Severe bioprosthetic valve regurgitation.  RECOMMENDATIONS:   We will speak with Dr. [name] concerning management of this patient's aortic bioprosthetic dysfunction with severe aortic regurgitation.  Currently out of heart failure and feeling back to baseline.  Diagnostic Dominance: Right    Patient Profile     74 y.o. male with a hx of bioprosthetic aortic valve replacement in 2008 by Dr. [name] atrial flutter,hypertension, hyperlipidemiaand recent admission to [facility] 11/14/19 with pneumonia who directly admitted from clinic for acute on chronic combined  CHF.  Assessment & Plan    1. Acute on chronic combined CHF:BNP 988>>884.Net -11.4L. Weight stable.  Underwent R/LHC noted above with no significant coronary disease.  - remains on oral lasix and BB  2. S/p bioprosthetic aortic valve replacement:echo noted above withvalve area, by VTI measures 1.42 cm. Aortic valve mean gradient measures 26.0 mmHg. Aortic valve Vmax measures 3.27 m/s. -underwent cath. Dr. [name] was consulted with recommendations for TAVR. This was discussed with the patient, who is agreeable. Seen by TAVR team with plans for valve in valve TAVR today.   3. HTN - Bp stable on current medications.   4. HLD - LDL 105, now on statin  5. Hypokalemia - resolved this morning  For questions or updates, please contact [facility] Please consult [email or web address] for contact info under   Signed, [name], NP  12/17/2019, 8:03 AM    I have personally seen and examined this patient. I agree with the assessment and plan as outlined above.  He is doing well this am. No dyspnea. Good diuresis over the past few days. Net -11.4 Liters. Now on oral Lasix. Plans for TAVR today. He has no questions.   Lauree Chandler 12/17/2019 10:09 AM

## 2019-12-17 NOTE — Progress Notes (Signed)
PT Cancellation Note  Patient Details Name: Douglas Hart MRN: [id number] DOB: May 09, 1946   Cancelled Treatment:    Reason Eval/Treat Not Completed: Patient at procedure or test/unavailable. Pt off floor for procedure, TVAR will f/u in am for re-assessment.  Horald Chestnut, PT    Delford Field 12/17/2019, 3:14 PM

## 2019-12-17 NOTE — H&P (View-Only) (Signed)
Progress Note  Patient Name: Douglas Hart Date of Encounter: 12/17/2019  Primary Cardiologist: [name], MD   Subjective   Feeling well, planned for TAVR today.   Inpatient Medications    Scheduled Meds: . aspirin  81 mg Oral Daily  . atorvastatin  40 mg Oral q1800  . chlorhexidine  15 mL Mouth/Throat Once  . enoxaparin (LOVENOX) injection  40 mg Subcutaneous Q24H  . furosemide  40 mg Oral Daily  . Living Better with Heart Failure Book   Does not apply Once  . magnesium sulfate  40 mEq Other To OR  . metoprolol succinate  50 mg Oral Daily  . mupirocin ointment  1 application Nasal BID  . potassium chloride  80 mEq Other To OR  . sodium chloride flush  3 mL Intravenous Q12H   Continuous Infusions: . sodium chloride    . cefUROXime (ZINACEF)  IV    . dexmedetomidine    . heparin 30,000 units/NS 1000 mL solution for CELLSAVER    . norepinephrine (LEVOPHED) Adult infusion    . vancomycin     PRN Meds: sodium chloride, acetaminophen, ondansetron (ZOFRAN) IV, temazepam   Vital Signs    Vitals:   12/16/19 0828 12/16/19 1259 12/16/19 1733 12/17/19 0350  BP: (!) 141/57  (!) 129/91 (!) 140/49  Pulse:  (!) 111 73 73  Resp:   18 17  Temp:   98.2 F (36.8 C) 97.9 F (36.6 C)  TempSrc:   Oral Oral  SpO2:  98% 98% 96%  Weight:    108 kg  Height:        Intake/Output Summary (Last 24 hours) at 12/17/2019 0803 Last data filed at 12/17/2019 0356 Gross per 24 hour  Intake 669 ml  Output 2450 ml  Net -1781 ml   Last 3 Weights 12/17/2019 12/16/2019 12/15/2019  Weight (lbs) 238 lb 1.6 oz 237 lb 11.2 oz 250 lb  Weight (kg) 108.001 kg 107.82 kg 113.4 kg      Telemetry    SR - Personally Reviewed  ECG    No new tracing.   Physical Exam  Pleasant older WM GEN: No acute distress.   Neck: No JVD Cardiac: RRR, + systolic/diastolic murmur, no rubs, or gallops.  Respiratory: Clear to auscultation bilaterally. GI: Soft, nontender, non-distended  MS: No edema; No  deformity. Neuro:  Nonfocal  Psych: Normal affect   Labs    High Sensitivity Troponin:  No results for input(s): TROPONINIHS in the last 720 hours.    Chemistry Recent Labs  Lab 12/15/19 0401 12/16/19 0325 12/17/19 0316  NA 140 141 142  K 3.2* 3.4* 4.1  CL 105 106 108  CO2 24 24 23   GLUCOSE 95 159* 97  BUN 25* 24* 23  CREATININE 1.20 1.31* 1.26*  CALCIUM 9.2 9.4 9.7  GFRNONAA 59* 53* 55*  GFRAA >60 >60 >60  ANIONGAP 11 11 11      Hematology Recent Labs  Lab 12/11/19 0215 12/11/19 0215 12/11/19 1605 12/12/19 0403 12/17/19 0316  WBC 5.7  --   --  6.3 5.1  RBC 3.67*  --   --  3.56* 3.64*  HGB 11.8*   < > 10.9*  10.9* 11.4* 12.0*  HCT 35.8*   < > 32.0*  32.0* 34.6* 35.7*  MCV 97.5  --   --  97.2 98.1  MCH 32.2  --   --  32.0 33.0  MCHC 33.0  --   --  32.9 33.6  RDW 14.8  --   --  14.6 14.7  PLT 131*  --   --  129* 145*   < > = values in this interval not displayed.    BNP Recent Labs  Lab 12/12/19 1038  BNP 884.0*     DDimer No results for input(s): DDIMER in the last 168 hours.   Radiology    No results found.  Cardiac Studies   Cath: 12/11/19   Severe pulmonary hypertension secondary to acute diastolic heart failure secondary to acute aortic regurgitation.  Widely patent left main, RCA, with 50 to 60% ostial circumflex and 50 to 60% mid LAD stenoses.  Severe bioprosthetic valve regurgitation.  RECOMMENDATIONS:   We will speak with Dr. [name] concerning management of this patient's aortic bioprosthetic dysfunction with severe aortic regurgitation.  Currently out of heart failure and feeling back to baseline.  Diagnostic Dominance: Right    Patient Profile     74 y.o. male with a hx of bioprosthetic aortic valve replacement in 2008 by Dr. [name] atrial flutter,hypertension, hyperlipidemiaand recent admission to [facility] 11/14/19 with pneumonia who directly admitted from clinic for acute on chronic combined  CHF.  Assessment & Plan    1. Acute on chronic combined CHF:BNP 988>>884.Net -11.4L. Weight stable.  Underwent R/LHC noted above with no significant coronary disease.  - remains on oral lasix and BB  2. S/p bioprosthetic aortic valve replacement:echo noted above withvalve area, by VTI measures 1.42 cm. Aortic valve mean gradient measures 26.0 mmHg. Aortic valve Vmax measures 3.27 m/s. -underwent cath. Dr. [name] was consulted with recommendations for TAVR. This was discussed with the patient, who is agreeable. Seen by TAVR team with plans for valve in valve TAVR today.   3. HTN - Bp stable on current medications.   4. HLD - LDL 105, now on statin  5. Hypokalemia - resolved this morning  For questions or updates, please contact [place] Please consult [email or web address] for contact info under   Signed, [name], NP  12/17/2019, 8:03 AM    I have personally seen and examined this patient. I agree with the assessment and plan as outlined above.  He is doing well this am. No dyspnea. Good diuresis over the past few days. Net -11.4 Liters. Now on oral Lasix. Plans for TAVR today. He has no questions.   Lauree Chandler 12/17/2019 10:09 AM

## 2019-12-17 NOTE — Anesthesia Procedure Notes (Signed)
Arterial Line Insertion Start/End4/02/2020 12:30 PM Performed by: Alain Marion, CRNA, CRNA  Patient location: Pre-op. Preanesthetic checklist: patient identified, IV checked, site marked, risks and benefits discussed, surgical consent, monitors and equipment checked, pre-op evaluation, timeout performed and anesthesia consent Lidocaine 1% used for infiltration Right, radial was placed Catheter size: 20 G Hand hygiene performed  and maximum sterile barriers used   Attempts: 1 Procedure performed without using ultrasound guided technique. Following insertion, dressing applied and Biopatch. Post procedure assessment: normal and unchanged  Patient tolerated the procedure well with no immediate complications.

## 2019-12-17 NOTE — Progress Notes (Signed)
  [facility]VE TEAM  Patient doing well s/p TAVR. He is hemodynamically stable. Groin sites stable. ECG with sinus brady with 1st deg AV block, but no high grade block. Arterial line discontinued and transferred to 4E. Plan for early ambulation after bedrest completed and hopeful discharge over the next 24-48 hours.   Angelena Form PA-C  [facility]  Pager [phone]

## 2019-12-17 NOTE — Progress Notes (Signed)
Patient ambulated 460 ft in the hallway. Tolerated well.

## 2019-12-17 NOTE — Anesthesia Procedure Notes (Signed)
Procedure Name: MAC Date/Time: 12/17/2019 12:58 PM Performed by: Alain Marion, CRNA Pre-anesthesia Checklist: Patient identified, Emergency Drugs available, Suction available, Patient being monitored and Timeout performed Patient Re-evaluated:Patient Re-evaluated prior to induction Oxygen Delivery Method: Simple face mask Placement Confirmation: positive ETCO2

## 2019-12-17 NOTE — Anesthesia Preprocedure Evaluation (Addendum)
Anesthesia Evaluation  Patient identified by MRN, date of birth, ID band Patient awake    Reviewed: Allergy & Precautions, NPO status , Patient's Chart, lab work & pertinent test results  Airway Mallampati: II  TM Distance: >3 FB Neck ROM: Full    Dental  (+) Missing, Partial Upper,    Pulmonary asthma , sleep apnea ,    Pulmonary exam normal        Cardiovascular hypertension, +CHF  + dysrhythmias Atrial Fibrillation + Valvular Problems/Murmurs AS  Rhythm:Regular Rate:Normal     Neuro/Psych negative neurological ROS  negative psych ROS   GI/Hepatic negative GI ROS, Neg liver ROS,   Endo/Other  negative endocrine ROS  Renal/GU negative Renal ROS     Musculoskeletal negative musculoskeletal ROS (+)   Abdominal Normal abdominal exam  (+)   Peds  Hematology   Anesthesia Other Findings   Reproductive/Obstetrics                           EKG: normal sinus rhythm.   Echo: Left ventricular ejection fraction, by estimation, is 65 to 70%. The left ventricle has normal function. The left ventricle has no regional wall motion abnormalities. There is mild left ventricular hypertrophy. Left ventricular diastolic function could not be evaluated. 2. Right ventricular systolic function is normal. The right ventricular size is normal. There is mildly elevated pulmonary artery systolic pressure. The estimated right ventricular systolic pressure is 34.2 mmHg. 3. Left atrial size was severely dilated. 4. The mitral valve is abnormal. Trivial mitral valve regurgitation. 5. The tricuspid valve is abnormal. 6. Valve leaflets are thickened with reduced mobility - the cusp in the area of the left coronary artery is not well-visualized and may be immobile. The aortic valve has been repaired/replaced. Aortic valve regurgitation is severe. Mild to moderate aortic valve stenosis. There is a Unknown bioprosthetic  valve present in the aortic position. Aortic regurgitation PHT measures 140 msec. Aortic valve area, by VTI measures 1.42 cm. Aortic valve mean gradient measures 26.0 mmHg. Aortic valve Vmax measures 3.27 m/s. 7. Aortic dilatation noted. 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 8. The inferior vena cava is dilated in size with <50% respiratory variability, suggesting right atrial pressure of 15 mmHg. Conclusion(s)/Recommendation(s): Findings concerning for  Anesthesia Physical Anesthesia Plan  ASA: IV  Anesthesia Plan: MAC   Post-op Pain Management:    Induction: Intravenous  PONV Risk Score and Plan: Propofol infusion and Ondansetron  Airway Management Planned: Simple Face Mask and Natural Airway  Additional Equipment: Arterial line  Intra-op Plan:   Post-operative Plan:   Informed Consent: I have reviewed the patients History and Physical, chart, labs and discussed the procedure including the risks, benefits and alternatives for the proposed anesthesia with the patient or authorized representative who has indicated his/her understanding and acceptance.       Plan Discussed with: CRNA  Anesthesia Plan Comments:        Anesthesia Quick Evaluation

## 2019-12-17 NOTE — Op Note (Signed)
[facility]   MULTIDISCIPLINARY HEART VALVE TEAM   TAVR OPERATIVE NOTE   Date of Procedure:  12/17/2019  Preoperative Diagnosis: Severe Aortic Stenosis   Postoperative Diagnosis: Same   Procedure:    Valve in ValveTranscatheter Aortic Valve Replacement - Percutaneous Right Transfemoral Approach  Edwards Sapien 3 Ultra THV (size 26 mm, model # 9750TFX, serial # [id number])   Co-Surgeons:  Gaye Pollack, MD and Lauree Chandler, MD   Anesthesiologist:  Wilfrid Lund, MD  Echocardiographer:  Jerilynn Mages. Croitoru, MD  Pre-operative Echo Findings:  Severe prosthetic aortic valve insufficiency  Normal left ventricular systolic function  Post-operative Echo Findings:  No paravalvular leak  Normal left ventricular systolic function   BRIEF CLINICAL NOTE AND INDICATIONS FOR SURGERY  He had a 25 mm Edwards Magna pericardial valve, model 3000 placed in 2008.  He says that he felt fine until 6 days following his COVID-19 vaccine when he developed progressive shortness of breath and fluid retention and was admitted with acute on chronic diastolic congestive heart failure.  2D echocardiogram showed severe prosthetic valve aortic insufficiency with a mean gradient across aortic valve of 26 mmHg.  Left ventricular systolic function was normal.  There was trivial mitral regurgitation.  Cardiac catheterization showed 50 to 60% ostial left circumflex stenosis and 50 to 60% mid LAD stenosis.  There was severe pulmonary hypertension with PA pressure of 68/32.  He has responded well to diuresis and said that he felt the best that he has about 3 days ago.  Over the last 2 days he said that he has had some orthopnea when laying down has been sleeping sitting up in the chair.  I agree that valve in valve transcatheter aortic valve replacement is indicated in this patient.  His 25 mm Edwards 3000 pericardial valve is suitable for a 26 mm SAPIEN 3 valve.  His gated cardiac CTA shows relatively  large sinuses of Valsalva and I think the coronary ostia should be okay.  The abdominal and pelvic CTA shows adequate pelvic vascular anatomy as well transfemoral insertion.  The patient was counseled at length regarding treatment alternatives for management of severe symptomatic prosthetic aortic valve insufficiency. The risks and benefits of surgical intervention has been discussed in detail. Long-term prognosis with medical therapy was discussed. Alternative approaches such as conventional redo surgical aortic valve replacement, transcatheter aortic valve replacement, and palliative medical therapy were compared and contrasted at length. This discussion was placed in the context of the patient's own specific clinical presentation and past medical history. All of his questions have been addressed. The patient is eager to proceed with surgical management in the morning.   Following the decision to proceed with transcatheter aortic valve replacement, a discussion was held regarding what types of management strategies would be attempted intraoperatively in the event of life-threatening complications, including whether or not the patient would be considered a candidate for the use of cardiopulmonary bypass and/or conversion to open sternotomy for attempted surgical intervention. The patient is aware of the fact that transient use of cardiopulmonary bypass may be necessary. Since he has had prior AVR I think an emergent redo sternotomy to manage any intraop complication would be very high risk and likely result in a poor outcome but he is a low risk surgical patient so I would still consider him a candidate for rescue surgery if needed.   The patient has been advised of a variety of complications that might develop including but not limited to risks of  death, stroke, paravalvular leak, aortic dissection or other major vascular complications, aortic annulus rupture, device embolization, cardiac rupture or  perforation, mitral regurgitation, acute myocardial infarction, arrhythmia, heart block or bradycardia requiring permanent pacemaker placement, congestive heart failure, respiratory failure, renal failure, pneumonia, infection, other late complications related to structural valve deterioration or migration, or other complications that might ultimately cause a temporary or permanent loss of functional independence or other long term morbidity. The patient provides full informed consent for the procedure as described and all questions were answered.     DETAILS OF THE OPERATIVE PROCEDURE  PREPARATION:    The patient is brought to the operating room on the above mentioned date and appropriate monitoring was established by the anesthesia team. The patient is placed in the supine position on the operating table.  Intravenous antibiotics are administered. The patient is monitored closely throughout the procedure under conscious sedation.  Baseline transthoracic echocardiogram was performed. The patient's abdomen and both groins are prepared and draped in a sterile manner. A time out procedure is performed.   PERIPHERAL ACCESS:    Using the modified Seldinger technique, femoral arterial and venous access was obtained with placement of 6 Fr sheaths on the left side.  A pigtail diagnostic catheter was passed through the left arterial sheath under fluoroscopic guidance into the aortic root.  A temporary transvenous pacemaker catheter was passed through the left femoral venous sheath under fluoroscopic guidance into the right ventricle.  The pacemaker was tested to ensure stable lead placement and pacemaker capture. The proper deployment angle was determined by lining up the tips of the three stent posts in the same plane and lining up the stent posts to take the parallax out of the valve.   TRANSFEMORAL ACCESS:   Percutaneous transfemoral access and sheath placement was performed using ultrasound guidance.   The right common femoral artery was cannulated using a micropuncture needle and appropriate location was verified using hand injection angiogram.  A pair of Abbott Perclose percutaneous closure devices were placed and a 6 French sheath replaced into the femoral artery.  The patient was heparinized systemically and ACT verified > 250 seconds.    A 14 Fr transfemoral E-sheath was introduced into the right common femoral artery after progressively dilating over an Amplatz superstiff wire. An AL-2 catheter was used to direct a straight-tip exchange length wire across the native aortic valve into the left ventricle. This was exchanged out for a pigtail catheter and position was confirmed in the LV apex. Simultaneous LV and Ao pressures were recorded.  The pigtail catheter was exchanged for an Amplatz Extra-stiff wire in the LV apex.     BALLOON AORTIC VALVULOPLASTY:   Not performed.   TRANSCATHETER HEART VALVE DEPLOYMENT:   An Edwards Sapien 3 Ultra transcatheter heart valve (size 26 mm, model #9750TFX, serial [id number]) was prepared and crimped per manufacturer's guidelines, and the proper orientation of the valve is confirmed on the [employer] delivery system. The valve was advanced through the introducer sheath using normal technique until in an appropriate position in the abdominal aorta beyond the sheath tip. The balloon was then retracted and using the fine-tuning wheel was centered on the valve. The valve was then advanced across the aortic arch using appropriate flexion of the catheter. The valve was carefully positioned across the aortic valve prosthesis. The Commander catheter was retracted using normal technique. Once final position of the valve has been confirmed the valve is deployed while temporarily holding ventilation and during rapid ventricular  pacing to maintain systolic blood pressure < 50 mmHg and pulse pressure < 10 mmHg. The balloon inflation is held for >3 seconds after reaching  full deployment volume. Once the balloon has fully deflated the balloon is retracted into the ascending aorta and valve function is assessed using echocardiography. There is felt to be no paravalvular leak and no central aortic insufficiency. The post-deployment gradient was low.  The patient's hemodynamic recovery following valve deployment is good.  The deployment balloon and guidewire are both removed.    PROCEDURE COMPLETION:   The sheath was removed and femoral artery closure performed.  Protamine was administered once femoral arterial repair was complete. The temporary pacemaker, pigtail catheters and femoral sheaths were removed with manual pressure used for hemostasis.  A Mynx femoral closure device was utilized following removal of the diagnostic sheath in the left femoral artery.  The patient tolerated the procedure well and is transported to the cath lab recovery area in stable condition. There were no immediate intraoperative complications. All sponge instrument and needle counts are verified correct at completion of the operation.   No blood products were administered during the operation.  The patient received no intravenous contrast during the procedure.   Gaye Pollack, MD 12/17/2019 2:47 PM

## 2019-12-17 NOTE — Progress Notes (Signed)
PT arrived from PACU. Telebox applied/ccmd notified. Vitals stable. Pt denies complaints. Pt agrees to bedrest until 18:30. CHG bath given. Pt oriented to room and call light within reach. Will continue to monitor.  Jerald Kief, RN

## 2019-12-17 NOTE — Progress Notes (Signed)
  Echocardiogram 2D Echocardiogram has been performed.  Douglas Hart 12/17/2019, 2:30 PM

## 2019-12-17 NOTE — Progress Notes (Signed)
One bag of belongings initially brought over with patient from cath lab. I requested person transporting patient take belongings back to cath lab. Bag had label on it.

## 2019-12-17 NOTE — Transfer of Care (Signed)
Immediate Anesthesia Transfer of Care Note  Patient: Douglas Hart  Procedure(s) Performed: TRANSCATHETER AORTIC VALVE REPLACEMENT, TRANSFEMORAL (N/A ) TRANSESOPHAGEAL ECHOCARDIOGRAM (TEE) (N/A )  Patient Location: PACU  Anesthesia Type:MAC  Level of Consciousness: drowsy and responds to stimulation  Airway & Oxygen Therapy: Patient Spontanous Breathing and Patient connected to nasal cannula oxygen  Post-op Assessment: Report given to RN and Post -op Vital signs reviewed and stable  Post vital signs: Reviewed and stable  Last Vitals:  Vitals Value Taken Time  BP    Temp    Pulse    Resp    SpO2      Last Pain:  Vitals:   12/17/19 0815  TempSrc:   PainSc: 0-No pain      Patients Stated Pain Goal: 0 (00/45/99 7741)  Complications: No apparent anesthesia complications

## 2019-12-18 ENCOUNTER — Inpatient Hospital Stay (HOSPITAL_COMMUNITY): Payer: BC Managed Care – PPO

## 2019-12-18 DIAGNOSIS — Z952 Presence of prosthetic heart valve: Secondary | ICD-10-CM

## 2019-12-18 DIAGNOSIS — E876 Hypokalemia: Secondary | ICD-10-CM

## 2019-12-18 DIAGNOSIS — I10 Essential (primary) hypertension: Secondary | ICD-10-CM

## 2019-12-18 DIAGNOSIS — T82897D Other specified complication of cardiac prosthetic devices, implants and grafts, subsequent encounter: Secondary | ICD-10-CM

## 2019-12-18 LAB — CBC
HCT: 33.7 % — ABNORMAL LOW (ref 39.0–52.0)
Hemoglobin: 11.1 g/dL — ABNORMAL LOW (ref 13.0–17.0)
MCH: 32.3 pg (ref 26.0–34.0)
MCHC: 32.9 g/dL (ref 30.0–36.0)
MCV: 98 fL (ref 80.0–100.0)
Platelets: 131 10*3/uL — ABNORMAL LOW (ref 150–400)
RBC: 3.44 MIL/uL — ABNORMAL LOW (ref 4.22–5.81)
RDW: 14.4 % (ref 11.5–15.5)
WBC: 5.2 10*3/uL (ref 4.0–10.5)
nRBC: 0 % (ref 0.0–0.2)

## 2019-12-18 LAB — BASIC METABOLIC PANEL
Anion gap: 9 (ref 5–15)
BUN: 15 mg/dL (ref 8–23)
CO2: 24 mmol/L (ref 22–32)
Calcium: 9 mg/dL (ref 8.9–10.3)
Chloride: 108 mmol/L (ref 98–111)
Creatinine, Ser: 1.1 mg/dL (ref 0.61–1.24)
GFR calc Af Amer: >60 mL/min (ref >60)
GFR calc non Af Amer: >60 mL/min (ref >60)
Glucose, Bld: 115 mg/dL — ABNORMAL HIGH (ref 70–99)
Potassium: 3.5 mmol/L (ref 3.5–5.1)
Sodium: 141 mmol/L (ref 135–145)

## 2019-12-18 LAB — MAGNESIUM: Magnesium: 2.1 mg/dL (ref 1.7–2.4)

## 2019-12-18 MED ORDER — CLOPIDOGREL BISULFATE 75 MG PO TABS: 75.0000 mg | ORAL_TABLET | Freq: Every day | ORAL | 1 refills | Status: DC

## 2019-12-18 MED ORDER — POTASSIUM CHLORIDE CRYS ER 20 MEQ PO TBCR
40.0000 meq | EXTENDED_RELEASE_TABLET | Freq: Once | ORAL | Status: AC
  Administered 2019-12-18: 08:00:00 40 meq via ORAL
  Filled 2019-12-18: qty 2

## 2019-12-18 MED ORDER — AMLODIPINE BESYLATE 5 MG PO TABS
5.0000 mg | ORAL_TABLET | Freq: Every day | ORAL | Status: DC
  Administered 2019-12-18: 5 mg via ORAL
  Filled 2019-12-18: qty 1

## 2019-12-18 MED ORDER — FUROSEMIDE 40 MG PO TABS
40.0000 mg | ORAL_TABLET | Freq: Every day | ORAL | Status: DC
  Administered 2019-12-18: 09:00:00 40 mg via ORAL
  Filled 2019-12-18: qty 1

## 2019-12-18 MED ORDER — ASPIRIN 81 MG PO CHEW: 81.0000 mg | CHEWABLE_TABLET | Freq: Every day | ORAL | 0 refills | Status: DC

## 2019-12-18 MED ORDER — LISINOPRIL 40 MG PO TABS
40.0000 mg | ORAL_TABLET | Freq: Every day | ORAL | Status: DC
  Administered 2019-12-18: 09:00:00 40 mg via ORAL
  Filled 2019-12-18: qty 1

## 2019-12-18 MED ORDER — METOPROLOL SUCCINATE ER 50 MG PO TB24
50.0000 mg | ORAL_TABLET | Freq: Every day | ORAL | Status: DC
  Administered 2019-12-18: 50 mg via ORAL
  Filled 2019-12-18: qty 1

## 2019-12-18 MED FILL — Lidocaine HCl Local Inj 1%: INTRAMUSCULAR | Qty: 20 | Status: AC

## 2019-12-18 NOTE — Progress Notes (Signed)
Progress Note  Patient Name: Douglas Hart Date of Encounter: 12/18/2019  Primary Cardiologist: [name], MD  TAVR: Angelena Form  Subjective   No dyspnea or chest pain.   Inpatient Medications    Scheduled Meds: . aspirin  81 mg Oral Daily  . clopidogrel  75 mg Oral Q breakfast  . sodium chloride flush  3 mL Intravenous Q12H   Continuous Infusions: . sodium chloride    . cefUROXime (ZINACEF)  IV 1.5 g (12/18/19 0551)  . nitroGLYCERIN    . phenylephrine (NEO-SYNEPHRINE) Adult infusion     PRN Meds: sodium chloride, acetaminophen **OR** acetaminophen, morphine injection, ondansetron (ZOFRAN) IV, oxyCODONE, sodium chloride flush, traMADol   Vital Signs    Vitals:   12/17/19 2133 12/18/19 0000 12/18/19 0242 12/18/19 0400  BP: 124/77 (!) 111/98 (!) 136/99 (!) 141/90  Pulse: 81 77 73 75  Resp: 20 18 18 19   Temp:  98 F (36.7 C)  98 F (36.7 C)  TempSrc:  Oral  Oral  SpO2: 93% 93% 93% 95%  Weight:      Height:        Intake/Output Summary (Last 24 hours) at 12/18/2019 0723 Last data filed at 12/18/2019 0600 Gross per 24 hour  Intake 1140 ml  Output 820 ml  Net 320 ml   Last 3 Weights 12/17/2019 12/16/2019 12/15/2019  Weight (lbs) 238 lb 1.6 oz 237 lb 11.2 oz 250 lb  Weight (kg) 108.001 kg 107.82 kg 113.4 kg      Telemetry    Sinus - Personally Reviewed  ECG    Sinus-personally reviewed.    Physical Exam   General: Well developed, well nourished, NAD  HEENT: OP clear, mucus membranes moist  SKIN: warm, dry. No rashes. Neuro: No focal deficits  Musculoskeletal: Muscle strength 5/5 all ext  Psychiatric: Mood and affect normal  Neck: No JVD Lungs:Clear bilaterally, no wheezes, rhonci, crackles Cardiovascular: Regular rate and rhythm. Soft systolic murmur.  Abdomen:Soft. Bowel sounds present. Non-tender.  Extremities: Trace bilateral LE edema. Groins soft without hematoma.    Labs    High Sensitivity Troponin:  No results for input(s): TROPONINIHS in  the last 720 hours.    Chemistry Recent Labs  Lab 12/17/19 0316 12/17/19 0316 12/17/19 0721 12/17/19 1318 12/17/19 1426 12/17/19 1446 12/18/19 0242  NA 142   < > 141   < > 141 141 141  K 4.1   < > 4.1   < > 4.5 4.4 3.5  CL 108   < > 109   < > 106 106 108  CO2 23  --  22  --   --   --  24  GLUCOSE 97   < > 97   < > 106* 106* 115*  BUN 23   < > 25*   < > 27* 27* 15  CREATININE 1.26*   < > 1.30*   < > 1.20 1.20 1.10  CALCIUM 9.7  --  9.5  --   --   --  9.0  PROT  --   --  6.2*  --   --   --   --   ALBUMIN  --   --  3.5  --   --   --   --   AST  --   --  28  --   --   --   --   ALT  --   --  48*  --   --   --   --  ALKPHOS  --   --  50  --   --   --   --   BILITOT  --   --  1.1  --   --   --   --   GFRNONAA 55*  --  53*  --   --   --  >60  GFRAA >60  --  >60  --   --   --  >60  ANIONGAP 11  --  10  --   --   --  9   < > = values in this interval not displayed.     Hematology Recent Labs  Lab 12/12/19 0403 12/12/19 0403 12/17/19 0316 12/17/19 1318 12/17/19 1426 12/17/19 1446 12/18/19 0242  WBC 6.3  --  5.1  --   --   --  5.2  RBC 3.56*  --  3.64*  --   --   --  3.44*  HGB 11.4*   < > 12.0*   < > 10.2* 10.2* 11.1*  HCT 34.6*   < > 35.7*   < > 30.0* 30.0* 33.7*  MCV 97.2  --  98.1  --   --   --  98.0  MCH 32.0  --  33.0  --   --   --  32.3  MCHC 32.9  --  33.6  --   --   --  32.9  RDW 14.6  --  14.7  --   --   --  14.4  PLT 129*  --  145*  --   --   --  131*   < > = values in this interval not displayed.    BNP Recent Labs  Lab 12/12/19 1038  BNP 884.0*     DDimer No results for input(s): DDIMER in the last 168 hours.   Radiology    ECHOCARDIOGRAM LIMITED  Result Date: 12/17/2019    ECHOCARDIOGRAM LIMITED REPORT   Patient Name:   Douglas Hart Date of Exam: 12/17/2019 Medical Rec #:  [id number]     Height:       73.0 in Accession #:    [id number]    Weight:       238.1 lb Date of Birth:  11/06/1945     BSA:          2.317 m Patient Age:    74 years      BP:            140/49 mmHg Patient Gender: M             HR:           73 bpm. Exam Location:  Inpatient Procedure: Limited Echo, Cardiac Doppler and Color Doppler Indications:     Aortic stenosis  History:         Patient has prior history of Echocardiogram examinations, most                  recent 12/10/2019. CHF, Pulmonary HTN, Aortic Valve Disease;                  Risk Factors:Hypertension and Dyslipidemia.                  Aortic Valve: 26 mm Edwards Edwards Sapien prosthetic, stented                  (TAVR) valve is present in the aortic position. Procedure Date:  12/17/2019.  Sonographer:     Dustin Flock Referring Phys:  [id number] [facility] Diagnosing Phys: Sanda Klein MD  PRE-PROCEDURAL FINDINGS: Normal left ventricular systolic function. Estimated LVEF 60-65%. There are no regional wall motion abnormalities. 23 mm bioprosthetic aortic valve with degenerative changes. There is severe aortic insufficiency and moderate aortic stenosis. Peak aortic valve gradient 43 mm Hg, mean gradient 28 mm Hg. Dimensionless obstructive index 0.36, calculated aortic valve area is 1.36 cm. Mild mitral insufficiency. No pericardial effusion. POST-PROCEDURAL FINDINGS: Successfulu "valve-in-valve" placement of 23 mm Edwards Sapien 3 Ultra stent-valve prosthesis. Hyperdynamic left ventricular systolic function. Estimated LVEF 75%. There are no regional wall motion abnormalities. Well-seated TAVR stent-valve. Peak aortic valve gradient 11 mm Hg, mean gradient 6 mm Hg. Dimensionless obstructive index 0.50, calculated aortic valve area is 1.49 cm. There is no aortic insufficiency and no perivalvular leak. Trivial mitral insufficiency. No pericardial effusion. IMPRESSIONS  1. There is a 26 mm Edwards Edwards Sapien prosthetic (TAVR) valve present in the aortic position. Procedure Date: 12/17/2019. FINDINGS  Aortic Valve: Aortic regurgitation PHT measures 231 msec. Aortic valve mean gradient measures 6.0  mmHg. Aortic valve peak gradient measures 10.9 mmHg. Aortic valve area, by VTI measures 1.49 cm. There is a 26 mm Edwards Edwards Sapien prosthetic, stented (TAVR) valve present in the aortic position. Procedure Date: 12/17/2019.  LEFT VENTRICLE PLAX 2D LVOT diam:     2.10 cm LV SV:         59 LV SV Index:   26 LVOT Area:     3.46 cm  AORTIC VALVE AV Area (Vmax):    1.73 cm AV Area (Vmean):   1.68 cm AV Area (VTI):     1.49 cm AV Vmax:           165.00 cm/s AV Vmean:          110.000 cm/s AV VTI:            0.397 m AV Peak Grad:      10.9 mmHg AV Mean Grad:      6.0 mmHg LVOT Vmax:         82.20 cm/s LVOT Vmean:        53.500 cm/s LVOT VTI:          0.171 m LVOT/AV VTI ratio: 0.43 AI PHT:            231 msec (pre-procedure)  SHUNTS Systemic VTI:  0.17 m Systemic Diam: 2.10 cm Dani Gobble Croitoru MD Electronically signed by Sanda Klein MD Signature Date/Time: 12/17/2019/2:55:32 PM    Final    Structural Heart Procedure  Result Date: 12/17/2019 See surgical note for result.   Cardiac Studies   Cath: 12/11/19   Severe pulmonary hypertension secondary to acute diastolic heart failure secondary to acute aortic regurgitation.  Widely patent left main, RCA, with 50 to 60% ostial circumflex and 50 to 60% mid LAD stenoses.  Severe bioprosthetic valve regurgitation.  RECOMMENDATIONS:   We will speak with Dr. [name] concerning management of this patient's aortic bioprosthetic dysfunction with severe aortic regurgitation.  Currently out of heart failure and feeling back to baseline.  Diagnostic Dominance: Right    Patient Profile     74 y.o. male with a hx of bioprosthetic aortic valve replacement in 2008 by Dr. [name] atrial flutter,hypertension, hyperlipidemiaand recent admission to [facility] 11/14/19 with pneumonia who directly admitted from clinic for acute on chronic combined CHF. He was diuresed over 11 liters with IV Lasix. TAVR on 12/17/19.  Assessment & Plan     1. Acute on chronic combined CHF:He has diuresed over 11 liters since admission. Volume status is much better. Will continue Lasix 40 mg daily.   2. Aortic valve disease/S/p bioprosthetic aortic valve replacement:He is now one day post TAVR with placement of a 26 mm Edwards Sapien 3 Ultra valve from the right transfemoral approach. He is doing well. Groins stable without hematoma. BP is stable. Will continue ASA and Plavix. Echo today to evaluate valve.   3. HTN: BP stable. Will resume home dose of Norvasc, Toprol and Lisinopril.    4. Hypokalemia: Will replace potassium today.   Likely discharge home today after echo.    For questions or updates, please contact [place] Please consult [email or web address] for contact info under   Signed, Lauree Chandler, MD  12/18/2019, 7:23 AM

## 2019-12-18 NOTE — Discharge Summary (Signed)
Discharge Summary    Patient ID: Douglas Hart,  MRN: [id number], DOB/AGE: 04-09-1946 74 y.o.  Admit date: 12/09/2019 Discharge date: 12/18/2019  Primary Care Provider: Emmaline Kluver Primary Cardiologist: [name], MD  Discharge Diagnoses    Principal Problem:   Aortic prosthetic valve regurgitation Active Problems:   H/O aortic valve replacement   Essential hypertension   Hyperlipidemia   Acute diastolic congestive heart failure (HCC)   OSA on CPAP   S/P TAVR (transcatheter aortic valve replacement)   Allergies Allergies  Allergen Reactions   Azithromycin     Can tolerate in small doses, eye burning    Diagnostic Studies/Procedures    TAVR: 12/17/19   Procedure:        Transcatheter Aortic Valve Replacement Valve in Valve- Transfemoral Approach             Edwards Sapien 3 THV (size 26 mm, model # [id number], serial # [id number])              Co-Surgeons:                        Lauree Chandler, MD and Gaye Pollack, MD   Anesthesiologist:                  Smith Robert  Echocardiographer:              Croitoru  Pre-operative Echo Findings: ? Severe aortic stenosis ? Normal left ventricular systolic function  Post-operative Echo Findings: ? No paravalvular leak ? Normal left ventricular systolic function _____________   History of Present Illness     Douglas Hart is a 74 yo male with history of aortic stenosis s/p bioprosthetic AVR in 2008, post-op atrial flutter, HTN, hyperlipidemia, asthma, sleep apnea on CPAP who has had recent progressive dyspnea on exertion and lower extremity edema. He notes feeling poorly for the past 4-6 weeks. He was admitted to [facility] on November 14, 2019 with respiratory distress and was found to have bilateral pleural effusions. Thoracentesis performed on both lungs. He was also treated for a possible pneumonia. His dyspnea continued following discharge from the hospital. He was seen by Dr. Tamala Julian on 12/09/19  and was directly admitted for acute CHF.   Hospital Course     Echo 12/10/19 with LVEF=65-70%, mild LVH. The bioprosthetic AVR was found to be degenerative with severe aortic insufficiency and moderate aortic stenosis (mean gradient 26 mmHg, peak gradient 42.8 mmHg). Cardiac cath 12/11/19 with moderate non-obstructive CAD. (50-60% ostial Circumflex, 50-60% mid LAD stenosis). He diuresed well with symptomatic improvement but still with lower extremity edema and orthopnea at night. The structural heart team was asked to evaluate for possible TAVR. His bioprosthetic AVR is an[employer] model 3000 pericardial tissue valve-serial number [id number] 68mm. He was deemed an appropriate candidate and taken for TAVR on 12/17/19 with successful placement of Edwards Sapien 3 THV (28mm) with Dr. Angelena Form and Dr. Cyndia Bent. Placed on ASA/plavix post procedure. He was transferred from the unit to [facility] without complications. Ambulated without difficulty the following morning. Echo completed prior to discharge.   [name] was seen by Dr. Angelena Form and determined stable for discharge home. Follow up in the office has been arranged. Medications are listed below.   _____________  Discharge Vitals Blood pressure (!) 151/92, pulse 74, temperature 98.1 F (36.7 C), temperature source Oral, resp. rate 20, height 6\' 1"  (1.854 m), weight 108 kg, SpO2  97 %.  Filed Weights   12/15/19 0646 12/16/19 0330 12/17/19 0350  Weight: 113.4 kg 107.8 kg 108 kg    Labs & Radiologic Studies    CBC Recent Labs    12/17/19 0316 12/17/19 1318 12/17/19 1446 12/18/19 0242  WBC 5.1  --   --  5.2  HGB 12.0*   < > 10.2* 11.1*  HCT 35.7*   < > 30.0* 33.7*  MCV 98.1  --   --  98.0  PLT 145*  --   --  131*   < > = values in this interval not displayed.   Basic Metabolic Panel Recent Labs    12/16/19 0325 12/17/19 0316 12/17/19 0721 12/17/19 1318 12/17/19 1446 12/18/19 0242  NA 141   < > 141   < > 141 141  K 3.4*   <  > 4.1   < > 4.4 3.5  CL 106   < > 109   < > 106 108  CO2 24   < > 22  --   --  24  GLUCOSE 159*   < > 97   < > 106* 115*  BUN 24*   < > 25*   < > 27* 15  CREATININE 1.31*   < > 1.30*   < > 1.20 1.10  CALCIUM 9.4   < > 9.5  --   --  9.0  MG 2.2  --   --   --   --  2.1   < > = values in this interval not displayed.   Liver Function Tests Recent Labs    12/17/19 0721  AST 28  ALT 48*  ALKPHOS 50  BILITOT 1.1  PROT 6.2*  ALBUMIN 3.5   No results for input(s): LIPASE, AMYLASE in the last 72 hours. Cardiac Enzymes No results for input(s): CKTOTAL, CKMB, CKMBINDEX, TROPONINI in the last 72 hours. BNP Invalid input(s): POCBNP D-Dimer No results for input(s): DDIMER in the last 72 hours. Hemoglobin A1C Recent Labs    12/17/19 0316  HGBA1C 5.2   Fasting Lipid Panel No results for input(s): CHOL, HDL, LDLCALC, TRIG, CHOLHDL, LDLDIRECT in the last 72 hours. Thyroid Function Tests No results for input(s): TSH, T4TOTAL, T3FREE, THYROIDAB in the last 72 hours.  Invalid input(s): FREET3 _____________  DG Chest 2 View  Result Date: 12/10/2019 CLINICAL DATA:  Chest pain and shortness of breath with fluid retention EXAM: CHEST - 2 VIEW COMPARISON:  10/20/2006 FINDINGS: Stable borderline cardiomegaly with symmetric interstitial opacity and mild fissure thickening. A few Kerley lines are present. Aortic valve replacement. Trace pleural fluid. IMPRESSION: Mild CHF. Electronically Signed   By: Monte Fantasia M.D.   On: 12/10/2019 08:28   CARDIAC CATHETERIZATION  Result Date: 12/11/2019  Severe pulmonary hypertension secondary to acute diastolic heart failure secondary to acute aortic regurgitation.  Widely patent left main, RCA, with 50 to 60% ostial circumflex and 50 to 60% mid LAD stenoses.  Severe bioprosthetic valve regurgitation. RECOMMENDATIONS:  We will speak with Dr. [name] concerning management of this patient's aortic bioprosthetic dysfunction with severe aortic  regurgitation.  Currently out of heart failure and feeling back to baseline.  CT CORONARY MORPH W/CTA COR W/SCORE W/CA W/CM &/OR WO/CM  Addendum Date: 12/16/2019   ADDENDUM REPORT: 12/16/2019 08:24 EXAM: OVER-READ INTERPRETATION  CT CHEST The following report is an over-read performed by radiologist Dr. Samara [facility] on 12/16/2019. This over-read does not include interpretation of cardiac or coronary anatomy or pathology.  The coronary CTA interpretation by the cardiologist is attached. COMPARISON:  11/14/2019 chest CT angiogram. FINDINGS: Please see the separate concurrent chest CT angiogram report for details. IMPRESSION: Please see the separate concurrent chest CT angiogram report for details. Electronically Signed   By: Ilona Sorrel M.D.   On: 12/16/2019 08:24   Result Date: 12/16/2019 CLINICAL DATA:  Bioprosthetic Valve Regurgitation/TAVR evaluation. EXAM: Cardiac TAVR CT TECHNIQUE: The patient was scanned on a [employer]. A 120 kV retrospective scan was triggered in the descending thoracic aorta at 111 HU's. Gantry rotation speed was 250 msecs and collimation was .6 mm. No beta blockade or nitro were given. The 3D data set was reconstructed in 5% intervals of the R-R cycle. Systolic and diastolic phases were analyzed on a dedicated work station using MPR, MIP and VRT modes. The patient received 80 cc of contrast. FINDINGS: Image quality: Excellent. Noise artifact is: Limited. Aortic Valve Prosthesis: The aortic valve has been replaced with a 25 mm [employer] 3000 pericardial tissue valve which was performed on 10/17/2006 per the medical record. 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 The leaflets of the prosthetic valve are thickened and there is severe prolapse of the leaflet of the LCC consistent with severe prosthetic valve regurgitation. There is no evidence of vegetation or aortic root abscess. There is no apparent paravalvular  leak. Virtual Transcatheter Heart Valve (THV) to Coronary Distance (VTC): Modeling was performed using a 26 mm Edwards Sapien 3 valve as recommended per the ViV application. RCA VTC: 4.1 mm. The RCA originates at the tip of the post of the virtual THV. LCA VTC: 6.7 mm. The LCA originates below the tip of the post of the virtual THV. Subannular calcification: No significant sub-annular calcification below the sewing ring. Optimal coplanar projection: LAO 15 CRA 6 Sinus of Valsalva Measurements: Asymmetric due to native bicuspid valve diease. Non-coronary: 36 mm Right-coronary: 32 mm Left-coronary: 36 mm Sinus of Valsalva Height: Non-coronary: 18 mm Right-coronary: 21 mm Left-coronary: 18.7 mm Sinotubular Junction: 34 mm.  Minimal calcified plaque. Ascending Thoracic Aorta: The ascending aorta measures up to 41 mm. No significant plaque. Coronary Arteries: Normal coronary origin. Right dominance. The study was performed without use of NTG and is insufficient for plaque evaluation. Cardiac Morphology: Right Atrium: Right atrial size is within normal limits. Right Ventricle: The right ventricular cavity is within normal limits. Left Atrium: Left atrial size is normal in size with no left atrial appendage filling defect. Left Ventricle: The ventricular cavity size is within normal limits. There are no stigmata of prior infarction. There is no abnormal filling defect. LVEF=63%. No regional wall motion abnormalities. Pulmonary arteries: Normal in size without proximal filling defect. Pulmonary veins: Normal pulmonary venous drainage. Pericardium: Normal thickness with no significant effusion or calcium present. Mitral Valve: The mitral valve is normal structure without significant calcification. Extra-cardiac findings: See attached radiology report for non-cardiac structures. IMPRESSION: 1. 25 mm Edwards [employer] 3000 pericardial bioprosthetic valve is present in the aortic position with severe prolapse of the LCC leaflet  suggestive of severe prosthetic valve regurgitation. 2. 26 mm Edwards Sapien 3 recommended per ViV application. 3. RCA VTC is borderline (4.1 mm) but the RCA originates at the tip of the post of the virtual THV. 4. LCA VTC is sufficient (6.7 mm). 5. Optimal Fluoroscopic Angle for Delivery: LAO 15 CRA 6. <CurrentUser>, MD Electronically Signed: By: Eleonore Chiquito On: 12/15/2019 15:08   DG CHEST PORT 1 VIEW  Result Date: 12/11/2019 CLINICAL DATA:  Shortness of breath and decreased urinary output. Prior aortic valve prosthesis. EXAM: PORTABLE CHEST 1 VIEW COMPARISON:  12/10/2019 FINDINGS: Reversal or ptotic projection. Mild cardiomegaly. Pulmonary interstitial accentuation. Indistinct pulmonary vasculature. The appearance suggests interstitial pulmonary edema. The diaphragms are somewhat obscured due to the reverse lordotic projection. Aortic valve prosthesis noted. IMPRESSION: Mild cardiomegaly with interstitial pulmonary edema, likely from congestive heart failure. Electronically Signed   By: Van Clines M.D.   On: 12/11/2019 19:00   CT ANGIO CHEST AORTA W/CM & OR WO/CM  Result Date: 12/16/2019 CLINICAL DATA:  Inpatient. History of aortic stenosis with bioprosthetic aortic valve replacement in 2008, admitted with recurrent severe aortic insufficiency and moderate aortic stenosis. Pre-TAVR planning. EXAM: CT ANGIOGRAPHY CHEST, ABDOMEN AND PELVIS TECHNIQUE: Non-contrast CT of the chest was initially obtained. Multidetector CT imaging through the chest, abdomen and pelvis was performed using the standard protocol during bolus administration of intravenous contrast. Multiplanar reconstructed images and MIPs were obtained and reviewed to evaluate the vascular anatomy. CONTRAST:  137mL OMNIPAQUE IOHEXOL 350 MG/ML SOLN COMPARISON:  11/14/2019 chest CT angiogram. FINDINGS: CTA CHEST FINDINGS Cardiovascular: Mild cardiomegaly. Aortic valve prosthesis in place. No significant pericardial effusion/thickening.  Atherosclerotic nonaneurysmal thoracic aorta. Normal caliber pulmonary arteries. No central pulmonary emboli. Mediastinum/Nodes: No discrete thyroid nodules. Unremarkable esophagus. No axillary adenopathy. Stable enlarged 1.6 cm right paratracheal node (series 4/image 38). Stable enlarged 1.1 cm subcarinal node (series 4/image 51). Stable enlarged 1.2 cm right hilar node (series 4/image 45). No pathologically enlarged left hilar nodes. Lungs/Pleura: No pneumothorax. Small dependent right pleural effusion. Trace dependent left pleural effusion. No acute consolidative airspace disease, lung masses or significant pulmonary nodules. Mild interlobular septal thickening in the lower lungs, right greater than left. Mild patchy right upper lobe ground-glass opacity, substantially decreased from prior. Musculoskeletal: No aggressive appearing focal osseous lesions. Intact sternotomy wires. Mild thoracic spondylosis. CTA ABDOMEN AND PELVIS FINDINGS Hepatobiliary: Normal liver with no liver mass. Normal gallbladder with no radiopaque cholelithiasis. No biliary ductal dilatation. Pancreas: Normal, with no mass or duct dilation. Spleen: Normal size. No mass. Adrenals/Urinary Tract: Normal adrenals. No contour deforming renal masses. No hydronephrosis. Normal bladder. Stomach/Bowel: Normal non-distended stomach. Normal caliber small bowel with no small bowel wall thickening. Candidate diminutive normal appendix. Normal large bowel with no diverticulosis, large bowel wall thickening or pericolonic fat stranding. Vascular/Lymphatic: Mildly atherosclerotic nonaneurysmal abdominal aorta. No pathologically enlarged lymph nodes in the abdomen or pelvis. Reproductive: Moderate prostatomegaly with mass-effect on the bladder base by the enlarged nodular median lobe of the prostate. Other: No pneumoperitoneum, ascites or focal fluid collection. Musculoskeletal: No aggressive appearing focal osseous lesions. Moderate lumbar spondylosis.  VASCULAR MEASUREMENTS PERTINENT TO TAVR: AORTA: Minimal Aortic Diameter-18.2 x 15.5 mm Severity of Aortic Calcification-mild RIGHT PELVIS: Right Common Iliac Artery - Minimal Diameter-11.8 x 11.7 mm Tortuosity-moderate Calcification-mild Right External Iliac Artery - Minimal Diameter-9.2 x 9.1 mm Tortuosity-mild-to-moderate Calcification-none Right Common Femoral Artery - Minimal Diameter-10.1 x 9.6 mm Tortuosity-mild Calcification-mild LEFT PELVIS: Left Common Iliac Artery - Minimal Diameter-11.2 x 10.6 mm Tortuosity-mild Calcification-mild Left External Iliac Artery - Minimal Diameter-9.6 x 9.6 mm Tortuosity-mild Calcification-none Left Common Femoral Artery - Minimal Diameter-9.7 x 9.4 mm Tortuosity-mild Calcification-none Review of the MIP images confirms the above findings. IMPRESSION: 1. Vascular findings and measurements pertinent to potential TAVR procedure, as detailed. Aortic valve prosthesis in place. 2. Mild cardiomegaly. 3. Small dependent right pleural effusion. Trace dependent left pleural effusion. 4. Mild interlobular septal thickening in the lower lungs  and mild patchy ground-glass opacity in the right upper lobe, suggesting mild pulmonary edema, improved since 11/14/2019 chest CT. 5. Stable mild mediastinal and right hilar adenopathy, nonspecific, probably reactive. 6. Moderate prostatomegaly. 7.  Aortic Atherosclerosis (ICD10-I70.0). Electronically Signed   By: Ilona Sorrel M.D.   On: 12/16/2019 08:57   ECHOCARDIOGRAM COMPLETE  Result Date: 12/10/2019    ECHOCARDIOGRAM REPORT   Patient Name:   Douglas Hart Date of Exam: 12/10/2019 Medical Rec #:  [id number]     Height:       73.0 in Accession #:    [id number]    Weight:       242.1 lb Date of Birth:  [birth year]     BSA:          2.334 m Patient Age:    2 years      BP:           140/46 mmHg Patient Gender: M             HR:           77 bpm. Exam Location:  Inpatient Procedure: 2D Echo, Color Doppler and Cardiac Doppler Indications:    Aortic  Valve Disorder i35.9  History:        Patient has prior history of Echocardiogram examinations, most                 recent 11/14/2019. CHF, Arrythmias:Atrial Flutter; Risk                 Factors:Hypertension and Dyslipidemia. 2008 Bioprosthetic Aortic                 Valve Replacement.                 Aortic Valve: Unknown bioprosthetic valve is present in the                 aortic position.  Sonographer:    Raquel Sarna Senior RDCS Referring Phys: [place]  1. Left ventricular ejection fraction, by estimation, is 65 to 70%. The left ventricle has normal function. The left ventricle has no regional wall motion abnormalities. There is mild left ventricular hypertrophy. Left ventricular diastolic function could not be evaluated.  2. Right ventricular systolic function is normal. The right ventricular size is normal. There is mildly elevated pulmonary artery systolic pressure. The estimated right ventricular systolic pressure is 99991111 mmHg.  3. Left atrial size was severely dilated.  4. The mitral valve is abnormal. Trivial mitral valve regurgitation.  5. The tricuspid valve is abnormal.  6. Valve leaflets are thickened with reduced mobility - the cusp in the area of the left coronary artery is not well-visualized and may be immobile. The aortic valve has been repaired/replaced. Aortic valve regurgitation is severe. Mild to moderate aortic valve stenosis. There is a Unknown bioprosthetic valve present in the aortic position. Aortic regurgitation PHT measures 140 msec. Aortic valve area, by VTI measures 1.42 cm. Aortic valve mean gradient measures 26.0 mmHg. Aortic valve Vmax measures 3.27 m/s.  7. Aortic dilatation noted. 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  8. The inferior vena cava is dilated in size with <50% respiratory variability, suggesting right atrial pressure of 15 mmHg. Conclusion(s)/Recommendation(s): Findings concerning for prosthetic valve leaflet  dysfunction with severe AI. Recommend TEE to further evalute and to more definitively evaluate for endocarditis - this could not be excluded on the basis of this  study. FINDINGS  Left Ventricle: Left ventricular ejection fraction, by estimation, is 65 to 70%. The left ventricle has normal function. The left ventricle has no regional wall motion abnormalities. The left ventricular internal cavity size was normal in size. There is  mild left ventricular hypertrophy. Left ventricular diastolic function could not be evaluated due to atrial fibrillation. Left ventricular diastolic function could not be evaluated. Right Ventricle: The right ventricular size is normal. No increase in right ventricular wall thickness. Right ventricular systolic function is normal. There is mildly elevated pulmonary artery systolic pressure. The tricuspid regurgitant velocity is 2.48  m/s, and with an assumed right atrial pressure of 15 mmHg, the estimated right ventricular systolic pressure is 99991111 mmHg. Left Atrium: Left atrial size was severely dilated. Right Atrium: Right atrial size was normal in size. Pericardium: There is no evidence of pericardial effusion. Mitral Valve: The mitral valve is abnormal. There is mild thickening of the mitral valve leaflet(s). Trivial mitral valve regurgitation. Tricuspid Valve: The tricuspid valve is abnormal. Tricuspid valve regurgitation is trivial. Aortic Valve: Valve leaflets are thickened with reduced mobility - the cusp in the area of the left coronary artery is not well-visualized and may be immobile. The aortic valve has been repaired/replaced. Aortic valve regurgitation is severe. Aortic regurgitation PHT measures 140 msec. Mild to moderate aortic stenosis is present. Aortic valve mean gradient measures 26.0 mmHg. Aortic valve peak gradient measures 42.8 mmHg. Aortic valve area, by VTI measures 1.42 cm. There is a Unknown bioprosthetic valve present in the aortic position. Pulmonic Valve: The  pulmonic valve was normal in structure. Pulmonic valve regurgitation is not visualized. Aorta: Aortic dilatation noted. 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 Venous: The inferior vena cava is dilated in size with less than 50% respiratory variability, suggesting right atrial pressure of 15 mmHg. IAS/Shunts: No atrial level shunt detected by color flow Doppler.  LEFT VENTRICLE PLAX 2D LVIDd:         4.90 cm  Diastology LVIDs:         3.00 cm  LV e' lateral:   8.16 cm/s LV PW:         1.10 cm  LV E/e' lateral: 13.7 LV IVS:        1.20 cm  LV e' medial:    5.66 cm/s LVOT diam:     2.10 cm  LV E/e' medial:  19.8 LV SV:         96 LV SV Index:   41 LVOT Area:     3.46 cm  RIGHT VENTRICLE RV S prime:     11.30 cm/s TAPSE (M-mode): 1.8 cm LEFT ATRIUM              Index       RIGHT ATRIUM           Index LA diam:        3.90 cm  1.67 cm/m  RA Area:     21.00 cm LA Vol (A2C):   141.0 ml 60.42 ml/m RA Volume:   62.00 ml  26.57 ml/m LA Vol (A4C):   133.0 ml 56.99 ml/m LA Biplane Vol: 143.0 ml 61.28 ml/m  AORTIC VALVE AV Area (Vmax):    1.29 cm AV Area (Vmean):   1.31 cm AV Area (VTI):     1.42 cm AV Vmax:           327.00 cm/s AV Vmean:  237.000 cm/s AV VTI:            0.678 m AV Peak Grad:      42.8 mmHg AV Mean Grad:      26.0 mmHg LVOT Vmax:         122.00 cm/s LVOT Vmean:        89.600 cm/s LVOT VTI:          0.278 m LVOT/AV VTI ratio: 0.41 AI PHT:            140 msec  AORTA Ao Root diam: 2.70 cm Ao Asc diam:  4.15 cm MITRAL VALVE                TRICUSPID VALVE MV Area (PHT): 3.42 cm     TR Peak grad:   24.6 mmHg MV Decel Time: 222 msec     TR Vmax:        248.00 cm/s MV E velocity: 112.00 cm/s                             SHUNTS                             Systemic VTI:  0.28 m                             Systemic Diam: 2.10 cm Lyman Bishop MD Electronically signed by Lyman Bishop MD Signature Date/Time: 12/10/2019/1:52:09 PM    Final    VAS US CAROTID  Result  Date: 12/13/2019 Carotid Arterial Duplex Study Indications:       Pre-TAVR. Risk Factors:      Hypertension, hyperlipidemia. Comparison Study:  No prior studies. Performing Technologist: Oliver Hum RVT  Examination Guidelines: A complete evaluation includes B-mode imaging, spectral Doppler, color Doppler, and power Doppler as needed of all accessible portions of each vessel. Bilateral testing is considered an integral part of a complete examination. Limited examinations for reoccurring indications may be performed as noted.  Right Carotid Findings: +----------+--------+--------+--------+-----------------------+--------+             PSV cm/s EDV cm/s Stenosis Plaque Description      Comments  +----------+--------+--------+--------+-----------------------+--------+  CCA Prox   135      1                 smooth and heterogenous           +----------+--------+--------+--------+-----------------------+--------+  CCA Distal 101      8                 smooth and heterogenous           +----------+--------+--------+--------+-----------------------+--------+  ICA Prox   73       11                smooth and heterogenous           +----------+--------+--------+--------+-----------------------+--------+  ICA Distal 88       13                                        tortuous  +----------+--------+--------+--------+-----------------------+--------+  ECA        104      0                                                   +----------+--------+--------+--------+-----------------------+--------+ +----------+--------+-------+--------+-------------------+  PSV cm/s EDV cms Describe Arm Pressure (mmHG)  +----------+--------+-------+--------+-------------------+  Subclavian 122                                            +----------+--------+-------+--------+-------------------+ +---------+--------+--+--------+-+---------+  Vertebral PSV cm/s 39 EDV cm/s 3 Antegrade  +---------+--------+--+--------+-+---------+  Left  Carotid Findings: +----------+--------+--------+--------+-----------------------+--------+             PSV cm/s EDV cm/s Stenosis Plaque Description      Comments  +----------+--------+--------+--------+-----------------------+--------+  CCA Prox   139      11                smooth and heterogenous           +----------+--------+--------+--------+-----------------------+--------+  CCA Distal 104      9                 smooth and heterogenous           +----------+--------+--------+--------+-----------------------+--------+  ICA Prox   85       7                 smooth and heterogenous tortuous  +----------+--------+--------+--------+-----------------------+--------+  ICA Distal 80       10                                        tortuous  +----------+--------+--------+--------+-----------------------+--------+  ECA        65       0                                                   +----------+--------+--------+--------+-----------------------+--------+ +----------+--------+--------+--------+-------------------+             PSV cm/s EDV cm/s Describe Arm Pressure (mmHG)  +----------+--------+--------+--------+-------------------+  Subclavian 104                                             +----------+--------+--------+--------+-------------------+ +---------+--------+--+--------+-+---------+  Vertebral PSV cm/s 56 EDV cm/s 7 Antegrade  +---------+--------+--+--------+-+---------+   Summary: Right Carotid: Velocities in the right ICA are consistent with a 1-39% stenosis. Left Carotid: Velocities in the left ICA are consistent with a 1-39% stenosis. Vertebrals: Bilateral vertebral arteries demonstrate antegrade flow. *See table(s) above for measurements and observations.  Electronically signed by Curt Jews MD on 12/13/2019 at 2:23:05 PM.    Final    ECHOCARDIOGRAM LIMITED  Result Date: 12/17/2019    ECHOCARDIOGRAM LIMITED REPORT   Patient Name:   Douglas Hart Date of Exam: 12/17/2019 Medical Rec #:  [id number]      Height:       73.0 in Accession #:    [id number]    Weight:       238.1 lb Date of Birth:  07-Jul-1946     BSA:          2.317 m Patient Age:    40 years      BP:           140/49 mmHg Patient Gender: M  HR:           73 bpm. Exam Location:  Inpatient Procedure: Limited Echo, Cardiac Doppler and Color Doppler Indications:     Aortic stenosis  History:         Patient has prior history of Echocardiogram examinations, most                  recent 12/10/2019. CHF, Pulmonary HTN, Aortic Valve Disease;                  Risk Factors:Hypertension and Dyslipidemia.                  Aortic Valve: 26 mm Edwards Edwards Sapien prosthetic, stented                  (TAVR) valve is present in the aortic position. Procedure Date:                  12/17/2019.  Sonographer:     Dustin Flock Referring Phys:  [id number] [place] Diagnosing Phys: Sanda Klein MD  PRE-PROCEDURAL FINDINGS: Normal left ventricular systolic function. Estimated LVEF 60-65%. There are no regional wall motion abnormalities. 23 mm bioprosthetic aortic valve with degenerative changes. There is severe aortic insufficiency and moderate aortic stenosis. Peak aortic valve gradient 43 mm Hg, mean gradient 28 mm Hg. Dimensionless obstructive index 0.36, calculated aortic valve area is 1.36 cm. Mild mitral insufficiency. No pericardial effusion. POST-PROCEDURAL FINDINGS: Successfulu "valve-in-valve" placement of 23 mm Edwards Sapien 3 Ultra stent-valve prosthesis. Hyperdynamic left ventricular systolic function. Estimated LVEF 75%. There are no regional wall motion abnormalities. Well-seated TAVR stent-valve. Peak aortic valve gradient 11 mm Hg, mean gradient 6 mm Hg. Dimensionless obstructive index 0.50, calculated aortic valve area is 1.49 cm. There is no aortic insufficiency and no perivalvular leak. Trivial mitral insufficiency. No pericardial effusion. IMPRESSIONS  1. There is a 26 mm Edwards Edwards Sapien prosthetic (TAVR) valve present  in the aortic position. Procedure Date: 12/17/2019. FINDINGS  Aortic Valve: Aortic regurgitation PHT measures 231 msec. Aortic valve mean gradient measures 6.0 mmHg. Aortic valve peak gradient measures 10.9 mmHg. Aortic valve area, by VTI measures 1.49 cm. There is a 26 mm Edwards Edwards Sapien prosthetic, stented (TAVR) valve present in the aortic position. Procedure Date: 12/17/2019.  LEFT VENTRICLE PLAX 2D LVOT diam:     2.10 cm LV SV:         59 LV SV Index:   26 LVOT Area:     3.46 cm  AORTIC VALVE AV Area (Vmax):    1.73 cm AV Area (Vmean):   1.68 cm AV Area (VTI):     1.49 cm AV Vmax:           165.00 cm/s AV Vmean:          110.000 cm/s AV VTI:            0.397 m AV Peak Grad:      10.9 mmHg AV Mean Grad:      6.0 mmHg LVOT Vmax:         82.20 cm/s LVOT Vmean:        53.500 cm/s LVOT VTI:          0.171 m LVOT/AV VTI ratio: 0.43 AI PHT:            231 msec (pre-procedure)  SHUNTS Systemic VTI:  0.17 m Systemic Diam: 2.10 cm Dani Gobble Croitoru MD Electronically signed by Sanda Klein MD Signature  Date/Time: 12/17/2019/2:55:32 PM    Final    Structural Heart Procedure  Result Date: 12/17/2019 See surgical note for result.  CT Angio Abd/Pel w/ and/or w/o  Result Date: 12/16/2019 CLINICAL DATA:  Inpatient. History of aortic stenosis with bioprosthetic aortic valve replacement in 2008, admitted with recurrent severe aortic insufficiency and moderate aortic stenosis. Pre-TAVR planning. EXAM: CT ANGIOGRAPHY CHEST, ABDOMEN AND PELVIS TECHNIQUE: Non-contrast CT of the chest was initially obtained. Multidetector CT imaging through the chest, abdomen and pelvis was performed using the standard protocol during bolus administration of intravenous contrast. Multiplanar reconstructed images and MIPs were obtained and reviewed to evaluate the vascular anatomy. CONTRAST:  178mL OMNIPAQUE IOHEXOL 350 MG/ML SOLN COMPARISON:  11/14/2019 chest CT angiogram. FINDINGS: CTA CHEST FINDINGS Cardiovascular: Mild cardiomegaly.  Aortic valve prosthesis in place. No significant pericardial effusion/thickening. Atherosclerotic nonaneurysmal thoracic aorta. Normal caliber pulmonary arteries. No central pulmonary emboli. Mediastinum/Nodes: No discrete thyroid nodules. Unremarkable esophagus. No axillary adenopathy. Stable enlarged 1.6 cm right paratracheal node (series 4/image 38). Stable enlarged 1.1 cm subcarinal node (series 4/image 51). Stable enlarged 1.2 cm right hilar node (series 4/image 45). No pathologically enlarged left hilar nodes. Lungs/Pleura: No pneumothorax. Small dependent right pleural effusion. Trace dependent left pleural effusion. No acute consolidative airspace disease, lung masses or significant pulmonary nodules. Mild interlobular septal thickening in the lower lungs, right greater than left. Mild patchy right upper lobe ground-glass opacity, substantially decreased from prior. Musculoskeletal: No aggressive appearing focal osseous lesions. Intact sternotomy wires. Mild thoracic spondylosis. CTA ABDOMEN AND PELVIS FINDINGS Hepatobiliary: Normal liver with no liver mass. Normal gallbladder with no radiopaque cholelithiasis. No biliary ductal dilatation. Pancreas: Normal, with no mass or duct dilation. Spleen: Normal size. No mass. Adrenals/Urinary Tract: Normal adrenals. No contour deforming renal masses. No hydronephrosis. Normal bladder. Stomach/Bowel: Normal non-distended stomach. Normal caliber small bowel with no small bowel wall thickening. Candidate diminutive normal appendix. Normal large bowel with no diverticulosis, large bowel wall thickening or pericolonic fat stranding. Vascular/Lymphatic: Mildly atherosclerotic nonaneurysmal abdominal aorta. No pathologically enlarged lymph nodes in the abdomen or pelvis. Reproductive: Moderate prostatomegaly with mass-effect on the bladder base by the enlarged nodular median lobe of the prostate. Other: No pneumoperitoneum, ascites or focal fluid collection.  Musculoskeletal: No aggressive appearing focal osseous lesions. Moderate lumbar spondylosis. VASCULAR MEASUREMENTS PERTINENT TO TAVR: AORTA: Minimal Aortic Diameter-18.2 x 15.5 mm Severity of Aortic Calcification-mild RIGHT PELVIS: Right Common Iliac Artery - Minimal Diameter-11.8 x 11.7 mm Tortuosity-moderate Calcification-mild Right External Iliac Artery - Minimal Diameter-9.2 x 9.1 mm Tortuosity-mild-to-moderate Calcification-none Right Common Femoral Artery - Minimal Diameter-10.1 x 9.6 mm Tortuosity-mild Calcification-mild LEFT PELVIS: Left Common Iliac Artery - Minimal Diameter-11.2 x 10.6 mm Tortuosity-mild Calcification-mild Left External Iliac Artery - Minimal Diameter-9.6 x 9.6 mm Tortuosity-mild Calcification-none Left Common Femoral Artery - Minimal Diameter-9.7 x 9.4 mm Tortuosity-mild Calcification-none Review of the MIP images confirms the above findings. IMPRESSION: 1. Vascular findings and measurements pertinent to potential TAVR procedure, as detailed. Aortic valve prosthesis in place. 2. Mild cardiomegaly. 3. Small dependent right pleural effusion. Trace dependent left pleural effusion. 4. Mild interlobular septal thickening in the lower lungs and mild patchy ground-glass opacity in the right upper lobe, suggesting mild pulmonary edema, improved since 11/14/2019 chest CT. 5. Stable mild mediastinal and right hilar adenopathy, nonspecific, probably reactive. 6. Moderate prostatomegaly. 7.  Aortic Atherosclerosis (ICD10-I70.0). Electronically Signed   By: Ilona Sorrel M.D.   On: 12/16/2019 08:57   Disposition   Pt is being discharged home today in good condition.  Follow-up Plans & Appointments    Follow-up Information    Belva Crome, MD. Go on 12/27/2019.   Specialty: Cardiology Why: @ 3:40pm  Contact information: [address] [place] [address] [phone]          Discharge Instructions    Call MD for:  redness, tenderness, or signs of infection  (pain, swelling, redness, odor or green/yellow discharge around incision site)   Complete by: As directed    Diet - low sodium heart healthy   Complete by: As directed    Discharge instructions   Complete by: As directed    ACTIVITY AND EXERCISE  Daily activity and exercise are an important part of your recovery. People recover at different rates depending on their general health and type of valve procedure.  Most people require six to 10 weeks to feel recovered.  No lifting, pushing, pulling more than 10 pounds (examples to avoid: groceries, vacuuming, gardening, golfing):  - For one week with a procedure through the groin.  - For six weeks for procedures through the chest wall.  - For three months for procedures through the breast-bone. NOTE: You will typically see one of our providers 7-10 days after your procedure to discuss [place] the above activities.    DRIVING  Do not drive for four weeks after the date of your procedure.  If you have been told by your doctor in the past that you may not drive, you must talk with him/her before you begin driving again.  When you resume driving, you must have someone with you.   DRESSING  Groin site: you may leave the clear dressing over the site for up to one week or until it falls off.   HYGIENE  If you had a femoral (leg) procedure, you may take a shower when you return home. After the shower, pat the site dry. Do NOT use powder, oils or lotions in your groin area until the site has completely healed.  If you had a chest procedure, you may shower when you return home unless specifically instructed not to by your discharging practitioner.  - DO NOT scrub incision; pat dry with a towel  - DO NOT apply any lotions, oils, powders to the incision  - No tub baths / swimming for at least six weeks.  If you notice any fevers, chills, increased pain, swelling, bleeding or pus, please contact your doctor.  ADDITIONAL INFORMATION   If you are going to have an upcoming dental procedure, please contact our office as you may require antibiotics ahead of time to prevent infection on your heart valve.   Increase activity slowly   Complete by: As directed      Discharge Medications     Medication List    TAKE these medications   amLODipine 5 MG tablet Commonly known as: NORVASC Take 1 tablet (5 mg total) by mouth daily.   amoxicillin 500 MG capsule Commonly known as: AMOXIL Take 2,000 mg by mouth See admin instructions. Take 2000 mg by mouth 1 hour prior to dental appointments   aspirin 81 MG chewable tablet Chew 1 tablet (81 mg total) by mouth daily. Start taking on: December 19, 2019   clopidogrel 75 MG tablet Commonly known as: PLAVIX Take 1 tablet (75 mg total) by mouth daily with breakfast. Start taking on: December 19, 2019   furosemide 40 MG tablet Commonly known as: LASIX Take 1 tablet (40 mg total) by mouth daily.  lisinopril 40 MG tablet Commonly known as: ZESTRIL Take 1 tablet (40 mg total) by mouth daily.   metoprolol succinate 50 MG 24 hr tablet Commonly known as: TOPROL-XL TAKE 1 TABLET BY MOUTH ONCE DAILY OR IMMEDIATELY FOLLOWING A MEAL What changed:   how much to take  how to take this  when to take this  additional instructions   multivitamin with minerals Tabs tablet Take 1 tablet by mouth daily.   potassium chloride SA 20 MEQ tablet Commonly known as: KLOR-CON Take 1 tablet (20 mEq total) by mouth daily.   vitamin C 1000 MG tablet Take 1,000 mg by mouth daily.   zinc gluconate 50 MG tablet Take 50 mg by mouth daily.        No                               Did the patient have a percutaneous coronary intervention (stent / angioplasty)?:  No.      Outstanding Labs/Studies   Follow up echo in one month.  Duration of Discharge Encounter   Greater than 30 minutes including physician time.  Signed, [name] NP-C 12/18/2019, 3:04 PM

## 2019-12-18 NOTE — Progress Notes (Signed)
Discharge instructions given to patient. IV removed, clean and intact. Medications and wound care reviewed. All questions answered. Pt escorted home with wife.  Zyniah Ferraiolo R Montina Dorrance, RN  

## 2019-12-18 NOTE — Anesthesia Postprocedure Evaluation (Signed)
Anesthesia Post Note  Patient: [name]  Procedure(s) Performed: TRANSCATHETER AORTIC VALVE REPLACEMENT, TRANSFEMORAL (N/A ) TRANSESOPHAGEAL ECHOCARDIOGRAM (TEE) (N/A )     Patient location during evaluation: PACU Anesthesia Type: MAC Level of consciousness: awake and alert Pain management: pain level controlled Vital Signs Assessment: post-procedure vital signs reviewed and stable Respiratory status: spontaneous breathing, nonlabored ventilation, respiratory function stable and patient connected to nasal cannula oxygen Cardiovascular status: stable and blood pressure returned to baseline Postop Assessment: no apparent nausea or vomiting Anesthetic complications: no    Last Vitals:  Vitals:   12/18/19 0738 12/18/19 1133  BP: (!) 154/93 (!) 151/92  Pulse: 74 74  Resp: 18 20  Temp: 36.8 C 36.7 C  SpO2: 96% 97%    Last Pain:  Vitals:   12/18/19 1133  TempSrc: Oral  PainSc:                  Effie Berkshire

## 2019-12-18 NOTE — Progress Notes (Signed)
ReDS Clip Diuretic Study Pt study # [id number]  Your patient is in the Blinded arm of the ReDS Clip Diuretic study.  Your patient has had a ReDS reading and the reading has been transmitted to the cloud.   Thank You   The research team   Vertis Kelch, PharmD, [facility] PGY2 Cardiology Pharmacy Resident Phone [phone] 12/18/2019       9:48 AM  Please check [email or web address] for unit-specific pharmacist phone numbers

## 2019-12-18 NOTE — Progress Notes (Signed)
  Echocardiogram 2D Echocardiogram has been performed.  Onie Hayashi G Levette Paulick 12/18/2019, 11:36 AM

## 2019-12-18 NOTE — Progress Notes (Signed)
CARDIAC REHAB PHASE I   PRE:  Rate/Rhythm: 78 SR  BP:  Supine:   Sitting: 164/94  Standing:    SaO2: 96%RA  MODE:  Ambulation: 470 ft   POST:  Rate/Rhythm: 100 SR  BP:  Supine:   Sitting: 167/94  Standing:    SaO2: 96%RA [id number] Pt walked 470 ft on RA with rolling walker and no assistance after eating breakfast. Tolerated well. Gave pt low sodium diets and discussed 2000 mg sodium restriction. Gave walking instructions for ex. Pt has attended CRP 2 before and not interested in attending again. Encouraged pt to weigh daily and notifiy MD of 3lbs overnight or 5 in week.  Pt has walker at home.    Graylon Good, RN BSN  12/18/2019 9:15 AM

## 2019-12-19 ENCOUNTER — Telehealth: Payer: Self-pay | Admitting: Physician Assistant

## 2019-12-19 LAB — ECHOCARDIOGRAM COMPLETE
Height: 73 in
Weight: 3809.6 oz

## 2019-12-19 NOTE — Telephone Encounter (Addendum)
  [place] VALVE TEAM   Patient contacted regarding discharge from [facility] on 4/721  Patient understands to follow up with provider Dr. Tamala Julian on 4/16 at [place].  Patient understands discharge instructions? yes Patient understands medications and regimen? yes Patient understands to bring all medications to this visit? Yes  Pt is doing quite well. He has some tenderness at his groin sites but otherwise doing fine. He became tearful on the phone as he was so thankful to the team for his care while at [facility]. He was especially thankful to Dr. [name] for being so attentive and getting the TAVR team involved.   Angelena Form PA-C  [facility]

## 2019-12-23 ENCOUNTER — Telehealth: Payer: Self-pay | Admitting: Interventional Cardiology

## 2019-12-23 NOTE — Telephone Encounter (Signed)
Patient has developed a rash on the inside of his R leg. The rash also is on his L leg and R arm, but it is most severe on his r leg. He needs to know what to do to get relief. He can not wait until his appt this Friday with Dr. Tamala Julian

## 2019-12-23 NOTE — Telephone Encounter (Signed)
I will call pt.  

## 2019-12-24 ENCOUNTER — Other Ambulatory Visit: Payer: Self-pay | Admitting: Physician Assistant

## 2019-12-24 ENCOUNTER — Encounter: Payer: Self-pay | Admitting: Physician Assistant

## 2019-12-24 NOTE — Telephone Encounter (Signed)
Got it.

## 2019-12-24 NOTE — Telephone Encounter (Signed)
Rash on leg is improving today since being off the plavix. He also reported some shortness of breath after taking plaivx. Will plan to keep him on monotherapy with aspirin. He sees Dr. Tamala Julian this Friday for follow up (pre existing appt). I will see him for 1 month follow up

## 2019-12-24 NOTE — Telephone Encounter (Signed)
Agree. thanks

## 2019-12-26 NOTE — Progress Notes (Signed)
Cardiology Office Note:    Date:  12/27/2019   ID:  [name], DOB 07/24/1946, MRN [id number]  PCP:  Street, Sharon Mt, MD  Cardiologist:  [name], MD   Referring MD: Street, Sharon Mt, *   Chief Complaint  Patient presents with  . Cardiac Valve Problem    History of Present Illness:    Douglas Hart is a 74 y.o. male with a hx of bioprosthetic aortic valve replacement in 2008 by Dr. [name] atrial flutter,hypertension, andhyperlipidemia.  [name] is doing much better.  He will underwent valve and valve TAVR of aortic insufficiency from a previously placed bioprosthetic aortic valve.  He was in heart failure due to severe aortic regurgitation.  His clinical syndrome of heart failure been progressive over 4 to 5 weeks before being admitted to the hospital here Douglas Hart, have not an expedited TAVR evaluation, and ultimately undergoing the procedure.  Since discharge from the hospital he continues to lose weight.  Furosemide 40 mg/day is a chronic therapy for him.  He has been monitoring fluid intake.  He is now able to lie down in bed without shortness of breath.  His appetite is been good.  He denies chills, fever, cough, and drainage from access sites.  After discharge he developed a maculopapular eruption diffusely, which resolved after discontinuation of clopidogrel.  Past Medical History:  Diagnosis Date  . Asthma   . Bicuspid aortic valve   . Chronic diastolic (congestive) heart failure ([place])   . Hearing loss   . Hypertension   . Knee pain   . Prosthetic valve dysfunction   . S/P AVR (aortic valve replacement)    s/p AVR with a 61mm [facility] Valve serial [id number] in 2008 by Dr. [name]  . 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 Sleep apnea    uses cpap    Past Surgical History:  Procedure Laterality Date  .  CARDIAC SURGERY    . RIGHT HEART CATH AND CORONARY/GRAFT ANGIOGRAPHY N/A 12/11/2019   Procedure: RIGHT HEART CATH AND CORONARY/GRAFT ANGIOGRAPHY;  Surgeon: Belva Crome, MD;  Location: [place] [facility];  Service: Cardiovascular;  Laterality: N/A;  . TEE WITHOUT CARDIOVERSION N/A 12/17/2019   Procedure: TRANSESOPHAGEAL ECHOCARDIOGRAM (TEE);  Surgeon: Burnell Blanks, MD;  Location: [place] [facility];  Service: Open Heart Surgery;  Laterality: N/A;  . TRANSCATHETER AORTIC VALVE REPLACEMENT, TRANSFEMORAL N/A 12/17/2019   Procedure: TRANSCATHETER AORTIC VALVE REPLACEMENT, TRANSFEMORAL;  Surgeon: Burnell Blanks, MD;  Location: [place] [facility];  Service: Open Heart Surgery;  Laterality: N/A;    Current Medications: Current Meds  Medication Sig  . amLODipine (NORVASC) 5 MG tablet Take 1 tablet (5 mg total) by mouth daily.  [name] amoxicillin (AMOXIL) 500 MG capsule Take 2,000 mg by mouth See admin instructions. Take 2000 mg by mouth 1 hour prior to dental appointments  . Ascorbic Acid (VITAMIN C) 1000 MG tablet Take 1,000 mg by mouth daily.  [name] aspirin 81 MG chewable tablet Chew 1 tablet (81 mg total) by mouth daily.  . furosemide (LASIX) 40 MG tablet Take 1 tablet (40 mg total) by mouth daily.  [name] lisinopril (PRINIVIL,ZESTRIL) 40 MG tablet Take 1 tablet (40 mg total) by mouth daily.  . metoprolol succinate (TOPROL-XL) 50 MG 24 hr tablet TAKE 1 TABLET BY MOUTH ONCE DAILY OR  IMMEDIATELY FOLLOWING A MEAL  . Multiple Vitamin (MULTIVITAMIN WITH MINERALS) TABS tablet Take 1 tablet by mouth daily.  . potassium chloride SA (K-DUR,KLOR-CON) 20 MEQ tablet Take 1 tablet (20 mEq total) by mouth daily.  [name] zinc gluconate 50 MG tablet Take 50 mg by mouth daily.     Allergies:   Azithromycin and Plavix [clopidogrel]   Social History   Socioeconomic History  . Marital status: Married    Spouse name: Not on file  . Number of children: Not on file  . Years of education: Not on file  . Highest  education level: Not on file  Occupational History  . Not on file  Tobacco Use  . Smoking status: Never Smoker  . Smokeless tobacco: Never Used  Substance and Sexual Activity  . Alcohol use: No  . Drug use: No  . Sexual activity: Not on file  Other Topics Concern  . Not on file  Social History Narrative  . Not on file   Social Determinants of Health   Financial Resource Strain:   . Difficulty of Paying Living Expenses:   Food Insecurity:   . Worried About Charity fundraiser in the Last Year:   . Arboriculturist in the Last Year:   Transportation Needs:   . Film/video editor (Medical):   [name] Lack of Transportation (Non-Medical):   Physical Activity:   . Days of Exercise per Week:   . Minutes of Exercise per Session:   Stress:   . Feeling of Stress :   Social Connections:   . Frequency of Communication with Friends and Family:   . Frequency of Social Gatherings with Friends and Family:   . Attends Religious Services:   . Active Member of Clubs or Organizations:   . Attends Archivist Meetings:   [name] Marital Status:      Family History: The patient's family history includes Alzheimer's disease in his mother; Heart disease in his brother; Heart disease (age of onset: 13) in his father; Hypertension in his brother and father; Leukemia (age of onset: 44) in his brother; Other in his daughter, daughter, and sister. There is no history of Heart attack or Stroke.  ROS:   Please see the history of present illness.    His appetite is stable.  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  His blood pressures at home have been running well.  He has not had chills or fever.  Appetite is been good.  All other systems reviewed and are negative.  EKGs/Labs/Other Studies Reviewed:    The following studies were reviewed today: No new data to speak of.  EKG:  EKG normal sinus rhythm, normal PR interval at 192 ms.  Nonspecific T wave flattening.  Poor R wave  progression V1 through V5.  When this EKG is compared to the prior 72,021, no significant changes noted.  Recent Labs: 12/12/2019: B Natriuretic Peptide 884.0 12/17/2019: ALT 48 12/18/2019: BUN 15; Creatinine, Ser 1.10; Hemoglobin 11.1; Magnesium 2.1; Platelets 131; Potassium 3.5; Sodium 141  Recent Lipid Panel    Component Value Date/Time   CHOL 165 12/13/2019 0217   TRIG 98 12/13/2019 0217   HDL 40 (L) 12/13/2019 0217   CHOLHDL 4.1 12/13/2019 0217   VLDL 20 12/13/2019 0217   LDLCALC 105 (H) 12/13/2019 0217    Physical Exam:    VS:  BP (!) 148/84   Pulse 76   Ht 6\' 1"  (1.854 m)  Wt 229 lb (103.9 kg)   SpO2 95%   BMI 30.21 kg/m     Wt Readings from Last 3 Encounters:  12/27/19 229 lb (103.9 kg)  12/17/19 238 lb 1.6 oz (108 kg)  12/09/19 247 lb 1.9 oz (112.1 kg)     GEN: Overweight, in no distress.. No acute distress HEENT: Normal NECK: No JVD. LYMPHATICS: No lymphadenopathy CARDIAC: 1/6 to 2/6 crescendo decrescendo right upper sternal border and left mid sternal border systolic murmur.  No aortic regurgitation is heard.  RRR.  No gallop but there is very minimal trace ankle edema. VASCULAR: Bilateral femoral access sites are unremarkable.  No hematoma.  No bruit.  Pedal pulses are 2+ and symmetric. RESPIRATORY:  Clear to auscultation without rales, wheezing or rhonchi  ABDOMEN: Soft, non-tender, non-distended, No pulsatile mass, MUSCULOSKELETAL: No deformity  SKIN: Warm and dry NEUROLOGIC:  Alert and oriented x 3 PSYCHIATRIC:  Normal affect   ASSESSMENT:    1. S/P TAVR (transcatheter aortic valve replacement)   2. Acute on chronic combined systolic and diastolic CHF (congestive heart failure) ([place])   3. H/O aortic valve replacement   4. Essential hypertension   5. Mixed hyperlipidemia   6. Educated about COVID-19 virus infection    PLAN:    In order of problems listed above:  1. Markedly improved from a clinical standpoint.  He has a soft systolic murmur that I  am sure is related to the prosthesis.  No regurgitation is heard.  Heart failure symptoms are resolving. 2. No evidence of volume overload.  Furosemide dose is now back to 40 mg/day.  We did not perform blood work today.  He seems stable. 3. TAVR valve was placed due to bioprosthetic surgical valve malfunction with aortic regurgitation.   4. Blood pressure is borderline.  We will continue to keep an eye on his current course.  He will follow-up with the structural heart team in early May.  5. We did not discuss his lipids. 6. COVID-19 vaccine has been received and social distancing/mask wearing is being continued.  We will allow the patient to finish his progression through structural heart team follow-up.  I will plan to see him again in 6 to 9 months as needed.  Happy to see him prior to then if issues.   Medication Adjustments/Labs and Tests Ordered: Current medicines are reviewed at length with the patient today.  Concerns regarding medicines are outlined above.  Orders Placed This Encounter  Procedures  . EKG 12-Lead   No orders of the defined types were placed in this encounter.   Patient Instructions  Medication Instructions:  Your physician recommends that you continue on your current medications as directed. Please refer to the Current Medication list given to you today.  1) DO NOT FORGET that you have to take Amoxicillin 2 grams by mouth 1 hour prior to any dental work that you receive.  *If you need a refill on your cardiac medications before your next appointment, please call your pharmacy*   Lab Work: None If you have labs (blood work) drawn today and your tests are completely normal, you will receive your results only by: [name] MyChart Message (if you have MyChart) OR . A paper copy in the mail If you have any lab test that is abnormal or we need to change your treatment, we will call you to review the results.   Testing/Procedures: None   Follow-Up: At [facility], you and your health needs are our priority.  As part of our continuing mission to provide you with exceptional heart care, we have created designated Provider Care Teams.  These Care Teams include your primary Cardiologist (physician) and Advanced Practice Providers (APPs -  Physician Assistants and Nurse Practitioners) who all work together to provide you with the care you need, when you need it.  We recommend signing up for the patient portal called "MyChart".  Sign up information is provided on this After Visit Summary.  MyChart is used to connect with patients for Virtual Visits (Telemedicine).  Patients are able to view lab/test results, encounter notes, upcoming appointments, etc.  Non-urgent messages can be sent to your provider as well.   To learn more about what you can do with MyChart, go to [email or web address].    Your next appointment:   6 month(s)  The format for your next appointment:   In Person  Provider:   You may see [name], MD or one of the following Advanced Practice Providers on your designated Care Team:    Truitt Merle, NP  Cecilie Kicks, NP  Kathyrn Drown, NP    Other Instructions      Signed, [name], MD  12/27/2019 4:16 PM    [place]

## 2019-12-27 ENCOUNTER — Other Ambulatory Visit: Payer: Self-pay

## 2019-12-27 ENCOUNTER — Encounter: Payer: Self-pay | Admitting: Interventional Cardiology

## 2019-12-27 ENCOUNTER — Ambulatory Visit (INDEPENDENT_AMBULATORY_CARE_PROVIDER_SITE_OTHER): Payer: BC Managed Care – PPO | Admitting: Interventional Cardiology

## 2019-12-27 VITALS — BP 148/84 | HR 76 | Ht 73.0 in | Wt 229.0 lb

## 2019-12-27 DIAGNOSIS — E782 Mixed hyperlipidemia: Secondary | ICD-10-CM

## 2019-12-27 DIAGNOSIS — I10 Essential (primary) hypertension: Secondary | ICD-10-CM | POA: Diagnosis not present

## 2019-12-27 DIAGNOSIS — Z952 Presence of prosthetic heart valve: Secondary | ICD-10-CM

## 2019-12-27 DIAGNOSIS — Z7189 Other specified counseling: Secondary | ICD-10-CM

## 2019-12-27 DIAGNOSIS — I5043 Acute on chronic combined systolic (congestive) and diastolic (congestive) heart failure: Secondary | ICD-10-CM

## 2019-12-27 NOTE — Patient Instructions (Signed)
Medication Instructions:  Your physician recommends that you continue on your current medications as directed. Please refer to the Current Medication list given to you today.  1) DO NOT FORGET that you have to take Amoxicillin 2 grams by mouth 1 hour prior to any dental work that you receive.  *If you need a refill on your cardiac medications before your next appointment, please call your pharmacy*   Lab Work: None If you have labs (blood work) drawn today and your tests are completely normal, you will receive your results only by: [name] MyChart Message (if you have MyChart) OR . A paper copy in the mail If you have any lab test that is abnormal or we need to change your treatment, we will call you to review the results.   Testing/Procedures: None   Follow-Up: At [facility], you and your health needs are our priority.  As part of our continuing mission to provide you with exceptional heart care, we have created designated Provider Care Teams.  These Care Teams include your primary Cardiologist (physician) and Advanced Practice Providers (APPs -  Physician Assistants and Nurse Practitioners) who all work together to provide you with the care you need, when you need it.  We recommend signing up for the patient portal called "MyChart".  Sign up information is provided on this After Visit Summary.  MyChart is used to connect with patients for Virtual Visits (Telemedicine).  Patients are able to view lab/test results, encounter notes, upcoming appointments, etc.  Non-urgent messages can be sent to your provider as well.   To learn more about what you can do with MyChart, go to [email or web address].    Your next appointment:   6 month(s)  The format for your next appointment:   In Person  Provider:   You may see [name], MD or one of the following Advanced Practice Providers on your designated Care Team:    Truitt Merle, NP  Cecilie Kicks, NP  Kathyrn Drown,  NP    Other Instructions

## 2020-01-02 ENCOUNTER — Encounter: Payer: Self-pay | Admitting: Physician Assistant

## 2020-01-14 NOTE — Progress Notes (Signed)
[facility] [place]                                       Cardiology Office Note    Date:  01/16/2020   ID:  Douglas Hart, DOB 03-Oct-1945, MRN [id number]  PCP:  Street, Sharon Mt, MD  Cardiologist: Dr. Tamala Julian / Dr. Buena Irish & Dr. Cyndia Bent (TAVR)  CC: 1 month s/p TAVR  History of Present Illness:  Douglas Hart is a 74 y.o. 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   He was admitted to [facility] on November 14, 2019 with respiratory distress and was found to have bilateral pleural effusions. Thoracentesis performed on both lungs. He was also treated for a possible pneumonia. His dyspnea continuedfollowing discharge from the hospital. He was seen by Dr. Tamala Julian on 12/09/19 and was directly admitted for acute CHF. Echo 12/10/19 with LVEF=65-70%, mild LVH. The bioprosthetic AVR was found to be degenerative with severe aortic insufficiency and moderate aortic stenosis (mean gradient 26 mmHg, peak gradient 42.8 mmHg). Cardiac cath 12/11/19 with moderate non-obstructive CAD. (50-60% ostial Circumflex, 50-60% mid LAD stenosis). He diuresed well with symptomatic improvementbut still with lower extremity edema and orthopnea at night. The structural heart team was asked to evaluate for possible TAVR. His bioprosthetic AVR is anEdward Life sciencesmodel 3000 pericardial tissue valve-serial number [id number] 36mm. He was deemed an appropriate candidate and taken for TAVR on 12/17/19 with successful placement of Edwards Sapien 3 THV (41mm) with Dr. Angelena Hart and Dr. Cyndia Bent. Placed on ASA/plavix post procedure. He was discharged home on 12/18/19.   He later developed a diffuse, pruritic maculopapular eruption felt to be related to plavix, which was  discontinued. He was doing much better in terms of this breathing when seen by Dr. Tamala Julian in the office on 12/27/19.   Today he presents to clinic for follow up. He is doing well. BP is elevated but he says it always runs high at the Dr's office. It has been running normal at home. No CP or SOB. No LE edema, orthopnea or PND. No dizziness or syncope. No blood in stool or urine. No palpitations.    Past Medical History:  Diagnosis Date  . Asthma   . Bicuspid aortic valve   . Chronic diastolic (congestive) heart failure ([place])   . Hearing loss   . Hypertension   . Knee pain   . Prosthetic valve dysfunction   . S/P AVR (aortic valve replacement)    s/p AVR with a 75mm [facility] Valve serial [id number] in 2008 by Dr. [name]  . 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 Sleep apnea    uses cpap    Past Surgical History:  Procedure Laterality Date  . CARDIAC SURGERY    . RIGHT HEART CATH AND CORONARY/GRAFT ANGIOGRAPHY N/A 12/11/2019   Procedure: RIGHT HEART CATH AND CORONARY/GRAFT ANGIOGRAPHY;  Surgeon: Belva Crome, MD;  Location: [place] [facility];  Service: Cardiovascular;  Laterality: N/A;  . TEE WITHOUT CARDIOVERSION N/A 12/17/2019   Procedure: TRANSESOPHAGEAL  ECHOCARDIOGRAM (TEE);  Surgeon: Burnell Blanks, MD;  Location: [place] [facility];  Service: Open Heart Surgery;  Laterality: N/A;  . TRANSCATHETER AORTIC VALVE REPLACEMENT, TRANSFEMORAL N/A 12/17/2019   Procedure: TRANSCATHETER AORTIC VALVE REPLACEMENT, TRANSFEMORAL;  Surgeon: Burnell Blanks, MD;  Location: [place] [facility];  Service: Open Heart Surgery;  Laterality: N/A;    Current Medications: Outpatient Medications Prior to Visit  Medication Sig Dispense Refill  . amLODipine (NORVASC) 5 MG tablet Take 1 tablet (5 mg total) by mouth daily. 90 tablet 3  . amoxicillin (AMOXIL) 500 MG  capsule Take 2,000 mg by mouth See admin instructions. Take 2000 mg by mouth 1 hour prior to dental appointments    . Ascorbic Acid (VITAMIN C) 1000 MG tablet Take 1,000 mg by mouth daily.    [name] aspirin 81 MG chewable tablet Chew 1 tablet (81 mg total) by mouth daily. 90 tablet 0  . furosemide (LASIX) 40 MG tablet Take 1 tablet (40 mg total) by mouth daily. 90 tablet 3  . lisinopril (PRINIVIL,ZESTRIL) 40 MG tablet Take 1 tablet (40 mg total) by mouth daily. 90 tablet 3  . metoprolol succinate (TOPROL-XL) 50 MG 24 hr tablet TAKE 1 TABLET BY MOUTH ONCE DAILY OR IMMEDIATELY FOLLOWING A MEAL 90 tablet 2  . Multiple Vitamin (MULTIVITAMIN WITH MINERALS) TABS tablet Take 1 tablet by mouth daily.    . potassium chloride SA (K-DUR,KLOR-CON) 20 MEQ tablet Take 1 tablet (20 mEq total) by mouth daily. 90 tablet 3  . zinc gluconate 50 MG tablet Take 50 mg by mouth daily.     No facility-administered medications prior to visit.     Allergies:   Azithromycin and Plavix [clopidogrel]   Social History   Socioeconomic History  . Marital status: Married    Spouse name: Not on file  . Number of children: Not on file  . Years of education: Not on file  . Highest education level: Not on file  Occupational History  . Not on file  Tobacco Use  . Smoking status: Never Smoker  . Smokeless tobacco: Never Used  Substance and Sexual Activity  . Alcohol use: No  . Drug use: No  . Sexual activity: Not on file  Other Topics Concern  . Not on file  Social History Narrative  . Not on file   Social Determinants of Health   Financial Resource Strain:   . Difficulty of Paying Living Expenses:   Food Insecurity:   . Worried About Charity fundraiser in the Last Year:   . Arboriculturist in the Last Year:   Transportation Needs:   . Film/video editor (Medical):   [name] Lack of Transportation (Non-Medical):   Physical Activity:   . Days of Exercise per Week:   . Minutes of Exercise per Session:   Stress:    . Feeling of Stress :   Social Connections:   . Frequency of Communication with Friends and Family:   . Frequency of Social Gatherings with Friends and Family:   . Attends Religious Services:   . Active Member of Clubs or Organizations:   . Attends Archivist Meetings:   [name] Marital Status:      Family History:  The patient's family history includes Alzheimer's disease in his mother; Heart disease in his brother; Heart disease (age of onset: 36) in his father; Hypertension in his brother and father; Leukemia (age of onset: 38) in his brother; Other in his daughter,  daughter, and sister.     ROS:   Please see the history of present illness.    ROS All other systems reviewed and are negative.   PHYSICAL EXAM:   VS:  BP (!) 160/88   Pulse 68   Ht 6\' 1"  (1.854 m)   Wt 238 lb 4 oz (108.1 kg)   SpO2 95%   BMI 31.43 kg/m    GEN: Well nourished, well developed, in no acute distress HEENT: normal Neck: no JVD or masses Cardiac: RRR; 3/6 SEM @ RSUB. No rubs, or gallops,no edema  Respiratory:  clear to auscultation bilaterally, normal work of breathing GI: soft, nontender, nondistended, + BS MS: no deformity or atrophy Skin: warm and dry, no rash Neuro:  Alert and Oriented x 3, Strength and sensation are intact Psych: euthymic mood, full affect   Wt Readings from Last 3 Encounters:  01/16/20 238 lb 4 oz (108.1 kg)  12/27/19 229 lb (103.9 kg)  12/17/19 238 lb 1.6 oz (108 kg)      Studies/Labs Reviewed:   EKG:  EKG is NOT ordered today.    Recent Labs: 12/12/2019: B Natriuretic Peptide 884.0 12/17/2019: ALT 48 12/18/2019: BUN 15; Creatinine, Ser 1.10; Hemoglobin 11.1; Magnesium 2.1; Platelets 131; Potassium 3.5; Sodium 141   Lipid Panel    Component Value Date/Time   CHOL 165 12/13/2019 0217   TRIG 98 12/13/2019 0217   HDL 40 (L) 12/13/2019 0217   CHOLHDL 4.1 12/13/2019 0217   VLDL 20 12/13/2019 0217   LDLCALC 105 (H) 12/13/2019 0217    Additional studies/  records that were reviewed today include:  TAVR: 12/17/19   Procedure:   Transcatheter Aortic Valve ReplacementValve in Valve- Transfemoral Approach Edwards Sapien 3 THV (size 74mm, model # [id number], serial [id number])  Co-Surgeons:Douglas Angelena Form, MD Douglas Ginger, MD   Anesthesiologist:Douglas Hart  Echocardiographer:Douglas Hart  Pre-operative Echo Findings: ? Severe aortic stenosis ? Normalleft ventricular systolic function  Post-operative Echo Findings: ? Noparavalvular leak ? Normalleft ventricular systolic function _____________  Echo 12/18/19  IMPRESSIONS  1. Left ventricular ejection fraction, by estimation, is 55 to 60%. The  left ventricle has normal function. The left ventricle has no regional  wall motion abnormalities. There is mild concentric left ventricular  hypertrophy. Left ventricular diastolic  parameters are consistent with Grade II diastolic dysfunction  (pseudonormalization). Elevated left atrial pressure.  2. Right ventricular systolic function is normal. The right ventricular  size is normal. There is mildly elevated pulmonary artery systolic  pressure. The estimated right ventricular systolic pressure is [id number] mmHg.  3. Left atrial size was severely dilated.  4. Right atrial size was mildly dilated.  5. The mitral valve is normal in structure. Trivial mitral valve  regurgitation. No evidence of mitral stenosis.  6. The aortic valve has been repaired/replaced. Aortic valve  regurgitation is not visualized. No aortic stenosis is present. There is a  26 mm Edwards Sapien prosthetic (TAVR) valve present in the aortic  position. Procedure Date: 12/17/2019. Echo findings  are consistent with normal structure and function of the aortic valve  prosthesis. Aortic valve mean gradient measures 18.0 mmHg. Aortic valve  Vmax measures 2.82 m/s.  7. The inferior  vena cava is dilated in size with <50% respiratory  variability, suggesting right atrial pressure of 15 mmHg.   _____________  Echo 01/16/20 IMPRESSIONS  1. Left ventricular ejection fraction, by estimation, is 55 to 60%. The left ventricle has normal function. The left ventricle has no regional  wall motion abnormalities.  There is mild concentric left ventricular  hypertrophy. Left ventricular diastolic  parameters are consistent with Grade II diastolic dysfunction  (pseudonormalization).  2. Right ventricular systolic function is normal. The right ventricular  size is normal. There is normal pulmonary artery systolic pressure.  3. Left atrial size was mildly dilated.  4. The mitral valve is normal in structure. No evidence of mitral valve  regurgitation. No evidence of mitral stenosis.  5. The mean aortic valve gradient has increased Hart 18 on the immediate  post procedure to 31 on todays echo. I have discussed with Douglas Range,  PA for the structural heart team. she will get a CTA of the aortic valve  to look for thrombus formation  on the AV .   The aortic valve has been repaired/replaced. Aortic valve  regurgitation is not visualized. Mild to moderate aortic valve stenosis.  Procedure Date: 12/17/19.   ASSESSMENT & PLAN:   Bioprosthetic aortic valve dysfunciton s/p valve in valve TAVR:echo today shows normally functioning with no AI but mean gradients are now more elevated from 18 mm hg on POD 1 echo to 31 mm Hg. Will get cardiac CT to rule out subclinical leaflet thrombosis. He otherwise is doing well clinically with NYHA class I symptoms. He is on monotherapy with aspirin due to a rash on plavix. He understands the need for ongoing SBE prophylaxis and has amoxicillin.   HTN: Bp elevated today but says he has a history of white coat HTN and it's running in good Hart at home. No changes made.   Hypokalemia: will check BMET today  Chronic combined CHF: appears  euvolemic. Continue lasix 40 mg daily and Kdur 20 meq daily. BMET today.    Medication Adjustments/Labs and Tests Ordered: Current medicines are reviewed at length with the patient today.  Concerns regarding medicines are outlined above.  Medication changes, Labs and Tests ordered today are listed in the Patient Instructions below. Patient Instructions  Your cardiac CT is scheduled at [facility] on Jan 22, 2020 at 10:45AM. [place], [place] [address] [phone]  Please arrive at the [facility] main entrance of [facility] at 10:15AM. Proceed to the [facility] Radiology Department (first floor) to check-in and test prep.  Please follow these instructions carefully:  Hold all erectile dysfunction medications at least 3 days (72 hrs) prior to test.  On the Night Before the Test: . Be sure to Drink plenty of water. . Do not consume any caffeinated/decaffeinated beverages or chocolate 12 hours prior to your test. . Do not take any antihistamines 12 hours prior to your test.  On the Day of the Test: . Drink plenty of water. Do not drink any water within one hour of the test. . Do not eat any food 4 hours prior to the test. . Make sure you take your TOPROL as directed. . You may take your regular medications prior to the test EXCEPT: o HOLD Furosemide the morning of the test.      After the Test: . Drink plenty of water. . After receiving IV contrast, you may experience a mild flushed feeling. This is normal. . On occasion, you may experience a mild rash up to 24 hours after the test. This is not dangerous. If this occurs, you can take Benadryl 25 mg and increase your fluid intake. . If you experience trouble breathing, this can be serious. If it is severe call 911 IMMEDIATELY. If it is mild, please call  our office.  For non-scheduling related questions, please contact the cardiac imaging nurse navigator should you have any  questions/concerns: Douglas Bond, RN Navigator Cardiac Imaging [name] [phone] office  For scheduling needs, including cancellations and rescheduling, please call [phone].     Signed, Angelena Form, PA-C  01/16/2020 [facility] [address], [place], [place]  [address] Phone: [phone]; Fax: [phone]

## 2020-01-16 ENCOUNTER — Other Ambulatory Visit: Payer: Self-pay

## 2020-01-16 ENCOUNTER — Ambulatory Visit (HOSPITAL_COMMUNITY): Payer: BC Managed Care – PPO | Attending: Cardiovascular Disease

## 2020-01-16 ENCOUNTER — Encounter: Payer: Self-pay | Admitting: Physician Assistant

## 2020-01-16 ENCOUNTER — Ambulatory Visit (INDEPENDENT_AMBULATORY_CARE_PROVIDER_SITE_OTHER): Payer: BC Managed Care – PPO | Admitting: Physician Assistant

## 2020-01-16 VITALS — BP 160/88 | HR 68 | Ht 73.0 in | Wt 238.2 lb

## 2020-01-16 DIAGNOSIS — Z952 Presence of prosthetic heart valve: Secondary | ICD-10-CM | POA: Diagnosis present

## 2020-01-16 NOTE — Patient Instructions (Addendum)
Your cardiac CT is scheduled at [facility] on Jan 22, 2020 at 10:45AM. [address] [place], [place] [address] [phone]  Please arrive at the [facility] main entrance of [facility] at 10:15AM. Proceed to the [facility] Radiology Department (first floor) to check-in and test prep.  Please follow these instructions carefully:  Hold all erectile dysfunction medications at least 3 days (72 hrs) prior to test.  On the Night Before the Test: . Be sure to Drink plenty of water. . Do not consume any caffeinated/decaffeinated beverages or chocolate 12 hours prior to your test. . Do not take any antihistamines 12 hours prior to your test.  On the Day of the Test: . Drink plenty of water. Do not drink any water within one hour of the test. . Do not eat any food 4 hours prior to the test. . Make sure you take your TOPROL as directed. . You may take your regular medications prior to the test EXCEPT: o HOLD Furosemide the morning of the test.      After the Test: . Drink plenty of water. . After receiving IV contrast, you may experience a mild flushed feeling. This is normal. . On occasion, you may experience a mild rash up to 24 hours after the test. This is not dangerous. If this occurs, you can take Benadryl 25 mg and increase your fluid intake. . If you experience trouble breathing, this can be serious. If it is severe call 911 IMMEDIATELY. If it is mild, please call our office.  For non-scheduling related questions, please contact the cardiac imaging nurse navigator should you have any questions/concerns: Marchia Bond, RN Navigator Cardiac Imaging [name] [phone] office  For scheduling needs, including cancellations and rescheduling, please call [phone].

## 2020-01-17 LAB — BASIC METABOLIC PANEL
BUN/Creatinine Ratio: 14 (ref 10–24)
BUN: 14 mg/dL (ref 8–27)
CO2: 26 mmol/L (ref 20–29)
Calcium: 10.1 mg/dL (ref 8.6–10.2)
Chloride: 105 mmol/L (ref 96–106)
Creatinine, Ser: 0.98 mg/dL (ref 0.76–1.27)
GFR calc Af Amer: 87 mL/min/{1.73_m2} (ref >59)
GFR calc non Af Amer: 75 mL/min/{1.73_m2} (ref >59)
Glucose: 96 mg/dL (ref 65–99)
Potassium: 3.3 mmol/L — ABNORMAL LOW (ref 3.5–5.2)
Sodium: 144 mmol/L (ref 134–144)

## 2020-01-22 ENCOUNTER — Ambulatory Visit (HOSPITAL_COMMUNITY)
Admission: RE | Admit: 2020-01-22 | Discharge: 2020-01-22 | Disposition: A | Discharge status: Home / Self Care | Payer: BC Managed Care – PPO | Source: Ambulatory Visit | Attending: Physician Assistant | Admitting: Physician Assistant

## 2020-01-22 ENCOUNTER — Encounter (HOSPITAL_COMMUNITY): Payer: Self-pay

## 2020-01-22 ENCOUNTER — Other Ambulatory Visit: Payer: Self-pay

## 2020-01-22 DIAGNOSIS — Z952 Presence of prosthetic heart valve: Secondary | ICD-10-CM | POA: Diagnosis not present

## 2020-01-22 DIAGNOSIS — R9430 Abnormal result of cardiovascular function study, unspecified: Secondary | ICD-10-CM

## 2020-01-22 MED ORDER — IOHEXOL 350 MG/ML SOLN
75.0000 mL | Freq: Once | INTRAVENOUS | Status: AC | PRN
  Administered 2020-01-22: 75 mL via INTRAVENOUS

## 2020-03-31 NOTE — Telephone Encounter (Signed)
Close this encounter

## 2020-07-08 NOTE — Progress Notes (Signed)
Cardiology Office Note:    Date:  07/10/2020   ID:  Douglas Hart, DOB 1945/10/11, MRN [id number]  PCP:  Street, Sharon Mt, MD  Cardiologist:  [name], MD   Referring MD: Street, Sharon Mt, *   Chief Complaint  Patient presents with  . Cardiac Valve Problem    History of Present Illness:    Douglas Hart is a 74 y.o. male with a hx of transaortic valve replacement/TAVR 2021 by Dr. Angelena Form within a previously placed bioprosthetic valve from  2008 by Dr. [name] atrial flutter,hypertension, andhyperlipidemia.  He feels well and has no cardiac complaints.  Currently no new medication side effects.  Echo looks okay.  He feels he is doing well.  Unrestricted salt in his diet  Past Medical History:  Diagnosis Date  . Asthma   . Bicuspid aortic valve   . Chronic diastolic (congestive) heart failure ([place])   . Hearing loss   . Hypertension   . Knee pain   . Prosthetic valve dysfunction   . S/P AVR (aortic valve replacement)    s/p AVR with a 44mm [facility] Valve serial #[id number] in 2008 by Dr. [name]  . 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 Sleep apnea    uses cpap    Past Surgical History:  Procedure Laterality Date  . CARDIAC SURGERY    . RIGHT HEART CATH AND CORONARY/GRAFT ANGIOGRAPHY N/A 12/11/2019   Procedure: RIGHT HEART CATH AND CORONARY/GRAFT ANGIOGRAPHY;  Surgeon: Belva Crome, MD;  Location: [place] [facility];  Service: Cardiovascular;  Laterality: N/A;  . TEE WITHOUT CARDIOVERSION N/A 12/17/2019   Procedure: TRANSESOPHAGEAL ECHOCARDIOGRAM (TEE);  Surgeon: Burnell Blanks, MD;  Location: [place] [facility];  Service: Open Heart Surgery;  Laterality: N/A;  . TRANSCATHETER AORTIC VALVE REPLACEMENT, TRANSFEMORAL N/A 12/17/2019   Procedure: TRANSCATHETER AORTIC VALVE REPLACEMENT, TRANSFEMORAL;  Surgeon:  Burnell Blanks, MD;  Location: [place] [facility];  Service: Open Heart Surgery;  Laterality: N/A;    Current Medications: No outpatient medications have been marked as taking for the 07/10/20 encounter (Office Visit) with Belva Crome, MD.     Allergies:   Azithromycin and Plavix [clopidogrel]   Social History   Socioeconomic History  . Marital status: Married    Spouse name: Not on file  . Number of children: Not on file  . Years of education: Not on file  . Highest education level: Not on file  Occupational History  . Not on file  Tobacco Use  . Smoking status: Never Smoker  . Smokeless tobacco: Never Used  Vaping Use  . Vaping Use: Never used  Substance and Sexual Activity  . Alcohol use: No  . Drug use: No  . Sexual activity: Not on file  Other Topics Concern  . Not on file  Social History Narrative  . Not on file   Social Determinants of Health   Financial Resource Strain:   . Difficulty of Paying Living Expenses: Not on file  Food Insecurity:   . Worried About Charity fundraiser in the Last Year: Not on file  . Ran Out of Food in the Last Year: Not on file  Transportation Needs:   . Lack of Transportation (Medical): Not on file  . Lack of Transportation (Non-Medical): Not on file  Physical Activity:   .  Days of Exercise per Week: Not on file  . Minutes of Exercise per Session: Not on file  Stress:   . Feeling of Stress : Not on file  Social Connections:   . Frequency of Communication with Friends and Family: Not on file  . Frequency of Social Gatherings with Friends and Family: Not on file  . Attends Religious Services: Not on file  . Active Member of Clubs or Organizations: Not on file  . Attends Archivist Meetings: Not on file  . Marital Status: Not on file     Family History: The patient's family history includes Alzheimer's disease in his mother; Heart disease in his brother; Heart disease (age of onset: 3) in his father;  Hypertension in his brother and father; Leukemia (age of onset: 56) in his brother; Other in his daughter, daughter, and sister. There is no history of Heart attack or Stroke.  ROS:   Please see the history of present illness.    Physically inactive.  Eats a lot of nuts and chips.  Does not sleep well.  All other systems reviewed and are negative.  EKGs/Labs/Other Studies Reviewed:    The following studies were reviewed today: Echocardiogram May 2021 IMPRESSIONS    1. Left ventricular ejection fraction, by estimation, is 55 to 60%. The  left ventricle has normal function. The left ventricle has no regional  wall motion abnormalities. There is mild concentric left ventricular  hypertrophy. Left ventricular diastolic  parameters are consistent with Grade II diastolic dysfunction  (pseudonormalization).  2. Right ventricular systolic function is normal. The right ventricular  size is normal. There is normal pulmonary artery systolic pressure.  3. Left atrial size was mildly dilated.  4. The mitral valve is normal in structure. No evidence of mitral valve  regurgitation. No evidence of mitral stenosis.  5. The mean aortic valve gradient has increased form 18 on the immediate  post procedure to 31 on todays echo. I have discussed with Nell Range,  PA for the structural heart team. she will get a CTA of the aortic valve  to look for thrombus formation  on the AV .      [name] The aortic valve has been repaired/replaced. Aortic valve  regurgitation is not visualized. Mild to moderate aortic valve stenosis.  Procedure Date: 12/17/19.   EKG:  EKG April EKG reveals sinus rhythm with nonspecific ST-T wave change.  Left atrial abnormality was noted.  Recent Labs: 12/12/2019: B Natriuretic Peptide 884.0 12/17/2019: ALT 48 12/18/2019: Hemoglobin 11.1; Magnesium 2.1; Platelets 131 01/16/2020: BUN 14; Creatinine, Ser 0.98; Potassium 3.3; Sodium 144  Recent Lipid Panel    Component Value  Date/Time   CHOL 165 12/13/2019 0217   TRIG 98 12/13/2019 0217   HDL 40 (L) 12/13/2019 0217   CHOLHDL 4.1 12/13/2019 0217   VLDL 20 12/13/2019 0217   LDLCALC 105 (H) 12/13/2019 0217    Physical Exam:    VS:  BP (!) 150/70   Pulse 65   Ht 6\' 1"  (1.854 m)   Wt 234 lb (106.1 kg)   SpO2 99%   BMI 30.87 kg/m     Wt Readings from Last 3 Encounters:  07/10/20 234 lb (106.1 kg)  01/16/20 238 lb 4 oz (108.1 kg)  12/27/19 229 lb (103.9 kg)     GEN: Obese.. No acute distress HEENT: Normal NECK: No JVD. LYMPHATICS: No lymphadenopathy CARDIAC: 2/6 systolic.  RRR without diastolic murmur, gallop, or edema. VASCULAR:  Normal Pulses. No bruits.  RESPIRATORY:  Clear to auscultation without rales, wheezing or rhonchi  ABDOMEN: Soft, non-tender, non-distended, No pulsatile mass, MUSCULOSKELETAL: No deformity  SKIN: Warm and dry NEUROLOGIC:  Alert and oriented x 3 PSYCHIATRIC:  Normal affect   ASSESSMENT:    1. S/P TAVR (transcatheter aortic valve replacement)   2. Acute on chronic combined systolic and diastolic CHF (congestive heart failure) ([place])   3. Essential hypertension   4. Mixed hyperlipidemia   5. Educated about COVID-19 virus infection    PLAN:    In order of problems listed above:  1. Doing well without symptoms of heart failure.  Volume status is normal on exam. 2. Continue furosemide, Zestril, Toprol-XL, and Norvasc.  May need to add spironolactone 3. We need to add spironolactone as above medication regimen is not controlling his systolic at a level that we need. 4. His LDL is high.  It should be treated.  He wants to do diet.  I encouraged him to get serious by cutting out saturated fats in his diet. 5. Vaccinated and without COVID-19 disease.  He will call in blood pressures after recording 2-3 times per week for a month.  Further management thereafter depending upon findings.  He does understand that we need to achieve a systolic blood pressure 147 or  less.   Medication Adjustments/Labs and Tests Ordered: Current medicines are reviewed at length with the patient today.  Concerns regarding medicines are outlined above.  No orders of the defined types were placed in this encounter.  No orders of the defined types were placed in this encounter.   There are no Patient Instructions on file for this visit.   Signed, [name], MD  07/10/2020 11:27 AM    [place]

## 2020-07-10 ENCOUNTER — Other Ambulatory Visit: Payer: Self-pay

## 2020-07-10 ENCOUNTER — Encounter: Payer: Self-pay | Admitting: Interventional Cardiology

## 2020-07-10 ENCOUNTER — Ambulatory Visit (INDEPENDENT_AMBULATORY_CARE_PROVIDER_SITE_OTHER): Payer: BC Managed Care – PPO | Admitting: Interventional Cardiology

## 2020-07-10 VITALS — BP 150/70 | HR 65 | Ht 73.0 in | Wt 234.0 lb

## 2020-07-10 DIAGNOSIS — Z952 Presence of prosthetic heart valve: Secondary | ICD-10-CM

## 2020-07-10 DIAGNOSIS — I10 Essential (primary) hypertension: Secondary | ICD-10-CM | POA: Diagnosis not present

## 2020-07-10 DIAGNOSIS — E782 Mixed hyperlipidemia: Secondary | ICD-10-CM | POA: Diagnosis not present

## 2020-07-10 DIAGNOSIS — I5043 Acute on chronic combined systolic (congestive) and diastolic (congestive) heart failure: Secondary | ICD-10-CM

## 2020-07-10 DIAGNOSIS — Z7189 Other specified counseling: Secondary | ICD-10-CM

## 2020-07-10 NOTE — Patient Instructions (Signed)
Medication Instructions:  Your physician recommends that you continue on your current medications as directed. Please refer to the Current Medication list given to you today.  *If you need a refill on your cardiac medications before your next appointment, please call your pharmacy*   Lab Work: None If you have labs (blood work) drawn today and your tests are completely normal, you will receive your results only by: [name] MyChart Message (if you have MyChart) OR . A paper copy in the mail If you have any lab test that is abnormal or we need to change your treatment, we will call you to review the results.   Testing/Procedures: None   Follow-Up: At [facility], you and your health needs are our priority.  As part of our continuing mission to provide you with exceptional heart care, we have created designated Provider Care Teams.  These Care Teams include your primary Cardiologist (physician) and Advanced Practice Providers (APPs -  Physician Assistants and Nurse Practitioners) who all work together to provide you with the care you need, when you need it.  We recommend signing up for the patient portal called "MyChart".  Sign up information is provided on this After Visit Summary.  MyChart is used to connect with patients for Virtual Visits (Telemedicine).  Patients are able to view lab/test results, encounter notes, upcoming appointments, etc.  Non-urgent messages can be sent to your provider as well.   To learn more about what you can do with MyChart, go to [email or web address].    Your next appointment:   9 month(s)  The format for your next appointment:   In Person  Provider:   You may see [name], MD or one of the following Advanced Practice Providers on your designated Care Team:    Truitt Merle, NP  Cecilie Kicks, NP  Kathyrn Drown, NP    Other Instructions  Monitor your blood pressure and let us know if your top number is consistently higher than  130.   Low-Sodium Eating Plan Sodium, which is an element that makes up salt, helps you maintain a healthy balance of fluids in your body. Too much sodium can increase your blood pressure and cause fluid and waste to be held in your body. Your health care provider or dietitian may recommend following this plan if you have high blood pressure (hypertension), kidney disease, liver disease, or heart failure. Eating less sodium can help lower your blood pressure, reduce swelling, and protect your heart, liver, and kidneys. What are tips for following this plan? General guidelines  Most people on this plan should limit their sodium intake to 1,500-2,000 mg (milligrams) of sodium each day. Reading food labels   The Nutrition Facts label lists the amount of sodium in one serving of the food. If you eat more than one serving, you must multiply the listed amount of sodium by the number of servings.  Choose foods with less than 140 mg of sodium per serving.  Avoid foods with 300 mg of sodium or more per serving. Shopping  Look for lower-sodium products, often labeled as "low-sodium" or "no salt added."  Always check the sodium content even if foods are labeled as "unsalted" or "no salt added".  Buy fresh foods. ? Avoid canned foods and premade or frozen meals. ? Avoid canned, cured, or processed meats  Buy breads that have less than 80 mg of sodium per slice. Cooking  Eat more home-cooked food and less restaurant, buffet, and fast food.  Avoid  adding salt when cooking. Use salt-free seasonings or herbs instead of table salt or sea salt. Check with your health care provider or pharmacist before using salt substitutes.  Cook with plant-based oils, such as canola, sunflower, or olive oil. Meal planning  When eating at a restaurant, ask that your food be prepared with less salt or no salt, if possible.  Avoid foods that contain MSG (monosodium glutamate). MSG is sometimes added to [place]  food, bouillon, and some canned foods. What foods are recommended? The items listed may not be a complete list. Talk with your dietitian about what dietary choices are best for you. Grains Low-sodium cereals, including oats, puffed wheat and rice, and shredded wheat. Low-sodium crackers. Unsalted rice. Unsalted pasta. Low-sodium bread. Whole-grain breads and whole-grain pasta. Vegetables Fresh or frozen vegetables. "No salt added" canned vegetables. "No salt added" tomato sauce and paste. Low-sodium or reduced-sodium tomato and vegetable juice. Fruits Fresh, frozen, or canned fruit. Fruit juice. Meats and other protein foods Fresh or frozen (no salt added) meat, poultry, seafood, and fish. Low-sodium canned tuna and salmon. Unsalted nuts. Dried peas, beans, and lentils without added salt. Unsalted canned beans. Eggs. Unsalted nut butters. Dairy Milk. Soy milk. Cheese that is naturally low in sodium, such as ricotta cheese, fresh mozzarella, or Swiss cheese Low-sodium or reduced-sodium cheese. Cream cheese. Yogurt. Fats and oils Unsalted butter. Unsalted margarine with no trans fat. Vegetable oils such as canola or olive oils. Seasonings and other foods Fresh and dried herbs and spices. Salt-free seasonings. Low-sodium mustard and ketchup. Sodium-free salad dressing. Sodium-free light mayonnaise. Fresh or refrigerated horseradish. Lemon juice. Vinegar. Homemade, reduced-sodium, or low-sodium soups. Unsalted popcorn and pretzels. Low-salt or salt-free chips. What foods are not recommended? The items listed may not be a complete list. Talk with your dietitian about what dietary choices are best for you. Grains Instant hot cereals. Bread stuffing, pancake, and biscuit mixes. Croutons. Seasoned rice or pasta mixes. Noodle soup cups. Boxed or frozen macaroni and cheese. Regular salted crackers. Self-rising flour. Vegetables Sauerkraut, pickled vegetables, and relishes. Olives. [place] fries. Onion  rings. Regular canned vegetables (not low-sodium or reduced-sodium). Regular canned tomato sauce and paste (not low-sodium or reduced-sodium). Regular tomato and vegetable juice (not low-sodium or reduced-sodium). Frozen vegetables in sauces. Meats and other protein foods Meat or fish that is salted, canned, smoked, spiced, or pickled. Bacon, ham, sausage, hotdogs, corned beef, chipped beef, packaged lunch meats, salt pork, jerky, pickled herring, anchovies, regular canned tuna, sardines, salted nuts. Dairy Processed cheese and cheese spreads. Cheese curds. Blue cheese. Feta cheese. String cheese. Regular cottage cheese. Buttermilk. Canned milk. Fats and oils Salted butter. Regular margarine. Ghee. Bacon fat. Seasonings and other foods Onion salt, garlic salt, seasoned salt, table salt, and sea salt. Canned and packaged gravies. Worcestershire sauce. Tartar sauce. Barbecue sauce. Teriyaki sauce. Soy sauce, including reduced-sodium. Steak sauce. Fish sauce. Oyster sauce. Cocktail sauce. Horseradish that you find on the shelf. Regular ketchup and mustard. Meat flavorings and tenderizers. Bouillon cubes. Hot sauce and Tabasco sauce. Premade or packaged marinades. Premade or packaged taco seasonings. Relishes. Regular salad dressings. Salsa. Potato and tortilla chips. Corn chips and puffs. Salted popcorn and pretzels. Canned or dried soups. Pizza. Frozen entrees and pot pies. Summary  Eating less sodium can help lower your blood pressure, reduce swelling, and protect your heart, liver, and kidneys.  Most people on this plan should limit their sodium intake to 1,500-2,000 mg (milligrams) of sodium each day.  Canned, boxed, and frozen foods  are high in sodium. Restaurant foods, fast foods, and pizza are also very high in sodium. You also get sodium by adding salt to food.  Try to cook at home, eat more fresh fruits and vegetables, and eat less fast food, canned, processed, or prepared foods. This  information is not intended to replace advice given to you by your health care provider. Make sure you discuss any questions you have with your health care provider. Document Revised: 08/11/2017 Document Reviewed: 08/22/2016 Elsevier Patient Education  2020 [employer].

## 2020-08-21 ENCOUNTER — Telehealth: Payer: Self-pay | Admitting: Interventional Cardiology

## 2020-08-21 NOTE — Telephone Encounter (Signed)
Spoke with pt and he states that he needs his last EKG sent to Dr. 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  Pt asked that I please fax to [phone].  EKG sent.

## 2020-08-21 NOTE — Telephone Encounter (Signed)
Patient requesting to speak with either Dr. Tamala Julian or Anderson Malta - states it is in regards to an EKG so he would rather speak with one of them.

## 2020-08-28 ENCOUNTER — Telehealth: Payer: Self-pay | Admitting: Interventional Cardiology

## 2020-08-28 NOTE — Telephone Encounter (Signed)
Spoke with pt and he said he needed his echo sent to Dr. Conard Novak.  He called recently to have EKG sent but that was the wrong thing.  Records faxed.

## 2020-08-28 NOTE — Telephone Encounter (Signed)
New message:    Patient calling stating that he need his results to [facility] fax [phone] Dr. Conard Novak main [phone] patient would also like for his nurse to call him.

## 2020-09-24 ENCOUNTER — Other Ambulatory Visit: Payer: Self-pay | Admitting: Physician Assistant

## 2020-09-24 DIAGNOSIS — Z952 Presence of prosthetic heart valve: Secondary | ICD-10-CM

## 2020-12-21 NOTE — Progress Notes (Signed)
HE[facility] AND [place]                                       Cardiology Office Note    Date:  12/23/2020   ID:  Douglas Hart, DOB [birth year], MRN [id number]  PCP:  Street, Sharon Mt, MD  Cardiologist: Dr. Tamala Julian / Dr. Buena Irish & Dr. Cyndia Bent (TAVR)  CC: 1 year s/p TAVR  History of Present Illness:  Douglas Hart is a 75 y.o. 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   He was admitted to [facility] on November 14, 2019 with respiratory distress and was found to have bilateral pleural effusions. Thoracentesis performed on both lungs. He was also treated for a possible pneumonia. His dyspnea continuedfollowing discharge from the hospital. He was seen by Dr. Tamala Julian on 12/09/19 and was directly admitted for acute CHF. Echo 12/10/19 with LVEF=65-70%, mild LVH. The bioprosthetic AVR was found to be degenerative with severe aortic insufficiency and moderate aortic stenosis (mean gradient 26 mmHg, peak gradient 42.8 mmHg). Cardiac cath 12/11/19 with moderate non-obstructive CAD. (50-60% ostial Circumflex, 50-60% mid LAD stenosis). He diuresed well with symptomatic improvement. The structural heart team was asked to evaluate for possible TAVR. His bioprosthetic AVR was anEdward Life sciencesmodel 3000 pericardial tissue valve-serial number [id number] 50mm. He was deemed an appropriate candidate and taken for TAVR on 12/17/19 with successful placement of Edwards Sapien 3 THV (61mm) with Dr. Angelena Form and Dr. Cyndia Bent. Placed on ASA/plavix post procedure. He was discharged home on 12/18/19.   He later developed a diffuse, pruritic maculopapular eruption felt to be related to plavix, which was discontinued. 1 month echo showed normally functioning TAVR with no AI  but mean gradient more elevated from 18 mm hg on POD 1 echo to 31 mm Hg. Follow up cardiac CT showed no evidence of subclinical leaflet thrombosis. He was continued on aspirin alone. Last seen by Dr. Tamala Julian in 06/2020 and doing well from a cardiac standpoint aside from uncontrolled hypertension. Plan was to start spironolactone but this never happened.   Today he presents to clinic for follow up. Doing great. Has gained some weight, due to poor eating. Trying to cut back on sugar intake. No CP or SOB. No LE edema, orthopnea or PND. No dizziness or syncope. No blood in stool or urine. No palpitations. Still driving a school bus.    Past Medical History:  Diagnosis Date  . Asthma   . Bicuspid aortic valve   . Chronic diastolic (congestive) heart failure ([place])   . Hearing loss   . Hypertension   . Knee pain   . Prosthetic valve dysfunction   . S/P AVR (aortic valve replacement)    s/p AVR with a 9mm [facility] Valve serial #[id number] in 2008 by Dr. [name]  . 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 Sleep apnea    uses cpap    Past Surgical History:  Procedure Laterality Date  . CARDIAC SURGERY    . RIGHT HEART CATH AND CORONARY/GRAFT ANGIOGRAPHY N/A 12/11/2019  Procedure: RIGHT HEART CATH AND CORONARY/GRAFT ANGIOGRAPHY;  Surgeon: Belva Crome, MD;  Location: [place] [facility];  Service: Cardiovascular;  Laterality: N/A;  . TEE WITHOUT CARDIOVERSION N/A 12/17/2019   Procedure: TRANSESOPHAGEAL ECHOCARDIOGRAM (TEE);  Surgeon: Burnell Blanks, MD;  Location: [place] [facility];  Service: Open Heart Surgery;  Laterality: N/A;  . TRANSCATHETER AORTIC VALVE REPLACEMENT, TRANSFEMORAL N/A 12/17/2019   Procedure: TRANSCATHETER AORTIC VALVE REPLACEMENT, TRANSFEMORAL;  Surgeon: Burnell Blanks, MD;  Location: [place] [facility];  Service: Open Heart Surgery;   Laterality: N/A;    Current Medications: Outpatient Medications Prior to Visit  Medication Sig Dispense Refill  . amLODipine (NORVASC) 5 MG tablet Take 1 tablet (5 mg total) by mouth daily. 90 tablet 3  . amoxicillin (AMOXIL) 500 MG capsule Take 2,000 mg by mouth See admin instructions. Take 2000 mg by mouth 1 hour prior to dental appointments    . Ascorbic Acid (VITAMIN C) 1000 MG tablet Take 1,000 mg by mouth daily.    [name] aspirin 81 MG chewable tablet Chew 1 tablet (81 mg total) by mouth daily. 90 tablet 0  . furosemide (LASIX) 40 MG tablet Take 1 tablet (40 mg total) by mouth daily. 90 tablet 3  . lisinopril (PRINIVIL,ZESTRIL) 40 MG tablet Take 1 tablet (40 mg total) by mouth daily. 90 tablet 3  . metoprolol succinate (TOPROL-XL) 50 MG 24 hr tablet TAKE 1 TABLET BY MOUTH ONCE DAILY OR IMMEDIATELY FOLLOWING A MEAL 90 tablet 2  . Multiple Vitamin (MULTIVITAMIN WITH MINERALS) TABS tablet Take 1 tablet by mouth daily.    . potassium chloride SA (K-DUR,KLOR-CON) 20 MEQ tablet Take 1 tablet (20 mEq total) by mouth daily. 90 tablet 3  . zinc gluconate 50 MG tablet Take 50 mg by mouth daily.     No facility-administered medications prior to visit.     Allergies:   Azithromycin and Plavix [clopidogrel]   Social History   Socioeconomic History  . Marital status: Married    Spouse name: Not on file  . Number of children: Not on file  . Years of education: Not on file  . Highest education level: Not on file  Occupational History  . Not on file  Tobacco Use  . Smoking status: Never Smoker  . Smokeless tobacco: Never Used  Vaping Use  . Vaping Use: Never used  Substance and Sexual Activity  . Alcohol use: No  . Drug use: No  . Sexual activity: Not on file  Other Topics Concern  . Not on file  Social History Narrative  . Not on file   Social Determinants of Health   Financial Resource Strain: Not on file  Food Insecurity: Not on file  Transportation Needs: Not on file  Physical  Activity: Not on file  Stress: Not on file  Social Connections: Not on file     Family History:  The patient's family history includes Alzheimer's disease in his mother; Heart disease in his brother; Heart disease (age of onset: 38) in his father; Hypertension in his brother and father; Leukemia (age of onset: 13) in his brother; Other in his daughter, daughter, and sister.     ROS:   Please see the history of present illness.    ROS All other systems reviewed and are negative.   PHYSICAL EXAM:   VS:  BP (!) 158/90   Pulse 70   Ht 6\' 1"  (1.854 m)   Wt 245 lb (111.1 kg)   SpO2 97%  BMI 32.32 kg/m    GEN: Well nourished, well developed, in no acute distress HEENT: normal Neck: no JVD or masses Cardiac: RRR; 2/6 SEM @ RSUB. No rubs, or gallops,no edema  Respiratory:  clear to auscultation bilaterally, normal work of breathing GI: soft, nontender, nondistended, + BS MS: no deformity or atrophy Skin: warm and dry, no rash Neuro:  Alert and Oriented x 3, Strength and sensation are intact Psych: euthymic mood, full affect   Wt Readings from Last 3 Encounters:  12/23/20 245 lb (111.1 kg)  07/10/20 234 lb (106.1 kg)  01/16/20 238 lb 4 oz (108.1 kg)      Studies/Labs Reviewed:   EKG:  EKG is NOT ordered today.    Recent Labs: 01/16/2020: BUN 14; Creatinine, Ser 0.98; Potassium 3.3; Sodium 144   Lipid Panel    Component Value Date/Time   CHOL 165 12/13/2019 0217   TRIG 98 12/13/2019 0217   HDL 40 (L) 12/13/2019 0217   CHOLHDL 4.1 12/13/2019 0217   VLDL 20 12/13/2019 0217   LDLCALC 105 (H) 12/13/2019 0217    Additional studies/ records that were reviewed today include:  TAVR: 12/17/19   Procedure:   Transcatheter Aortic Valve ReplacementValve in Valve- Transfemoral Approach Edwards Sapien 3 THV (size 85mm, model # [id number], serial #[id number])  Co-Surgeons:Christopher Angelena Form, MD Delane Ginger, MD    Anesthesiologist:Hollis  Echocardiographer:Croitoru  Pre-operative Echo Findings: ? Severe aortic stenosis ? Normalleft ventricular systolic function  Post-operative Echo Findings: ? Noparavalvular leak ? Normalleft ventricular systolic function _____________  Echo 12/18/19  IMPRESSIONS  1. Left ventricular ejection fraction, by estimation, is 55 to 60%. The  left ventricle has normal function. The left ventricle has no regional  wall motion abnormalities. There is mild concentric left ventricular  hypertrophy. Left ventricular diastolic  parameters are consistent with Grade II diastolic dysfunction  (pseudonormalization). Elevated left atrial pressure.  2. Right ventricular systolic function is normal. The right ventricular  size is normal. There is mildly elevated pulmonary artery systolic  pressure. The estimated right ventricular systolic pressure is 39.7 mmHg.  3. Left atrial size was severely dilated.  4. Right atrial size was mildly dilated.  5. The mitral valve is normal in structure. Trivial mitral valve  regurgitation. No evidence of mitral stenosis.  6. The aortic valve has been repaired/replaced. Aortic valve  regurgitation is not visualized. No aortic stenosis is present. There is a  26 mm Edwards Sapien prosthetic (TAVR) valve present in the aortic  position. Procedure Date: 12/17/2019. Echo findings  are consistent with normal structure and function of the aortic valve  prosthesis. Aortic valve mean gradient measures 18.0 mmHg. Aortic valve  Vmax measures 2.82 m/s.  7. The inferior vena cava is dilated in size with <50% respiratory  variability, suggesting right atrial pressure of 15 mmHg.   _____________  Echo 01/16/20 IMPRESSIONS  1. Left ventricular ejection fraction, by estimation, is 55 to 60%. The left ventricle has normal function. The left ventricle has no regional  wall motion abnormalities. There is  mild concentric left ventricular  hypertrophy. Left ventricular diastolic  parameters are consistent with Grade II diastolic dysfunction  (pseudonormalization).  2. Right ventricular systolic function is normal. The right ventricular  size is normal. There is normal pulmonary artery systolic pressure.  3. Left atrial size was mildly dilated.  4. The mitral valve is normal in structure. No evidence of mitral valve  regurgitation. No evidence of mitral stenosis.  5. The mean aortic valve gradient has  increased form 18 on the immediate  post procedure to 31 on todays echo. I have discussed with Nell Range,  PA for the structural heart team. she will get a CTA of the aortic valve  to look for thrombus formation  on the AV .   The aortic valve has been repaired/replaced. Aortic valve  regurgitation is not visualized. Mild to moderate aortic valve stenosis.  Procedure Date: 12/17/19.   _________________  Cardiac CT 01/22/20 IMPRESSION: 1. Well positioned and expanded 26 mm Sapien 3 valve in valve TAVR with no HAM/HALT or evidence of sub acute thrombosis 2.  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 Left ventricular ejection fraction, by estimation, is 60 to 65%. The left ventricle has normal function. The left ventricle has no regional wall motion abnormalities. There is mild left ventricular hypertrophy. Left ventricular diastolic parameters  are consistent with Grade I diastolic dysfunction (impaired relaxation).  2. Right ventricular systolic function is normal. The right ventricular size is mildly enlarged. Tricuspid regurgitation signal is inadequate for assessing PA pressure.  3. Left atrial size was mildly dilated.  4. Right atrial size was mildly dilated.  5. The mitral valve is normal in structure. Trivial mitral valve regurgitation. No evidence of mitral stenosis.  6. Bioprosthetic aortic valve s/p TAVR with 25 mm Edwards  Sapien THV. No significant peri-valvular leakage. The mean gradient across the valve was moderately elevated to 32 mmHg.  7. Aortic dilatation noted. There is mild dilatation of the aortic root, measuring 38 mm.  8. The inferior vena cava is normal in size with greater than 50% respiratory variability, suggesting right atrial pressure of 3 mmHg.   ASSESSMENT & PLAN:   Bioprosthetic aortic valve dysfunction s/p valve in valve TAVR:echo today EF 60%, normally functioning TAVR with a mean gradient of 32 mmHg and no PVL. Mean gradient has been persistently elevated since 1 month echo when CT ruled out leaflet thrombosis. Gradient has remained stable ~30 mmhg. He has NYHA class I symptoms. He understands the need for ongoing SBE prophylaxis and has amoxicillin. Continue on aspirin 81 mg daily alone. Continue regular follow up with Dr. Tamala Julian  HTN: BP still elevated. Continue Norvasc 5mg  daily, Lasix 40mg  daily, Toprol XL 50mg  daily and lisinopril 40mg  daily. Plan at last visit was to start spiro but this never happened. Will start spiro 25mg  daily today. He has a history of hypokalemia so will not stop potassium supplementation. Will check BMET today and decide what to do about potassium. He has follow up with PCP next month and will get all basic labs and physical.   Chronic combined CHF: appears euvolemic. Continue lasix 40mg  daily.    Medication Adjustments/Labs and Tests Ordered: Current medicines are reviewed at length with the patient today.  Concerns regarding medicines are outlined above.  Medication changes, Labs and Tests ordered today are listed in the Patient Instructions below. Patient Instructions  Medication Instructions:  Your physician has recommended you make the following change in your medication:  1. START Spironolactone 25 mg once daily  *If you need a refill on your cardiac medications before your next appointment, please call your pharmacy*   Lab Work: Today: BMET If  you have labs (blood work) drawn today and your tests are completely normal, you will receive your results only by: [name] MyChart Message (if you have MyChart) OR . A paper copy in the mail If you have any lab test that is abnormal or  we need to change your treatment, we will call you to review the results.   Testing/Procedures: None ordered   Follow-Up: At [facility], you and your health needs are our priority.  As part of our continuing mission to provide you with exceptional heart care, we have created designated Provider Care Teams.  These Care Teams include your primary Cardiologist (physician) and Advanced Practice Providers (APPs -  Physician Assistants and Nurse Practitioners) who all work together to provide you with the care you need, when you need it.  Your next appointment:   2 month(s)  The format for your next appointment:   In Person  Provider:   Daneen Schick, MD    Thank you for choosing [facility]!!     Other Instructions  Hart your follow up appointment with your primary doctor next month for follow up on your blood pressures and lab    Spironolactone Oral Tablets What is this medicine? SPIRONOLACTONE (speer on oh LAK tone) is a diuretic. It helps you make more urine and to lose salt and excess water from your body. It treats swelling from heart, kidney, or liver disease. It treats high blood pressure. It also treats high aldosterone levels in the blood. This medicine may be used for other purposes; ask your health care provider or pharmacist if you have questions. COMMON BRAND NAME(S): Aldactone What should I tell my health care provider before I take this medicine? They need to know if you have any of these conditions:  Addison's disease or low adrenal gland function  high blood level of potassium  kidney disease  liver disease  an unusual or allergic reaction to spironolactone, other medicines, foods, dyes, or preservatives  pregnant or  trying to get pregnant  breast-feeding How should I use this medicine? Take this medicine by mouth. Take it as directed on the prescription label at the same time every day. You can take it with or without food. You should always take it the same way. Hart taking it unless your health care provider tells you to stop. Talk to your health care provider about the use of this drug in children. Special care may be needed. Overdosage: If you think you have taken too much of this medicine contact a poison control center or emergency room at once. NOTE: This medicine is only for you. Do not share this medicine with others. What if I miss a dose? If you miss a dose, take it as soon as you can. If it is almost time for your next dose, take only that dose. Do not take double or extra doses. What may interact with this medicine? Do not take this medicine with any of the following medications:  cidofovir  eplerenone  tranylcypromine This medicine may also interact with the following medications:  aspirin  certain medicines for blood pressure or heart disease like benazepril, lisinopril, losartan, valsartan  certain medicines that treat or prevent blood clots like heparin and enoxaparin  cholestyramine  cyclosporine  digoxin  lithium  medicines that relax muscles for surgery  NSAIDs, medicines for pain and inflammation, like ibuprofen or naproxen  other diuretics  potassium salts or supplements  steroid medicines like prednisone or cortisone  trimethoprim This list may not describe all possible interactions. Give your health care provider a list of all the medicines, herbs, non-prescription drugs, or dietary supplements you use. Also tell them if you smoke, drink alcohol, or use illegal drugs. Some items may interact with your medicine. What should  I watch for while using this medicine? Visit your doctor or health care provider for regular checks on your progress. Check your blood  pressure as directed. Ask your health care provider what your blood pressure should be. Also, find out when you should contact him or her. Do not treat yourself for coughs, colds, or pain while you are using this medicine without asking your health care provider for advice. Some medicines may increase your blood pressure. Check with your health care provider if you have severe diarrhea, nausea, and vomiting, or if you sweat a lot. The loss of too much body fluid may make it dangerous for you to take this medicine. You may need to be on a special diet while taking this medicine. Ask your health care provider. Also, find out how many glasses of fluid you need to drink each day. You may get drowsy or dizzy. Do not drive, use machinery, or do anything that needs mental alertness until you know how this medicine affects you. Do not stand or sit up quickly, especially if you are an older patient. This reduces the risk of dizzy or fainting spells. Alcohol may interfere with the effects of this medicine. Avoid alcoholic drinks. Avoid salt substitutes unless you are told otherwise by your health care provider. What side effects may I notice from receiving this medicine? Side effects that you should report to your health care provider as soon as possible:  allergic reactions (skin rash, itching or hives, swelling of the face, lips, or tongue)  breast enlargement in both males and females  changes in menstrual cycle  gout (severe pain, redness, or swelling in joints like the big toe)  high blood sugar (increased hunger, thirst or urination; unusually weak or tired, blurry vision)  high potassium levels (chest pain; fast irregular heartbeat; muscle weakness)  kidney injury (trouble passing urine or change in the amount of urine)  low blood pressure (dizziness; feeling faint or lightheaded, falls; unusually weak or tired)  low calcium levels (fast heartbeat; muscle cramps or pain; pain, tingling, or  numbness in the hands or feet; seizures)  low magnesium levels (fast, irregular heartbeat; feeling faint or lightheaded, falls; muscle cramps or pain) Side effects that usually do not require medical attention (report to your health care provider if they continue or are bothersome):  changes in sex drive or performance  dizziness  headache  upset stomach This list may not describe all possible side effects. Call your doctor for medical advice about side effects. You may report side effects to FDA at 1-800-FDA-1088. Where should I Hart my medicine? Hart out of the reach of children and pets. Store below 25 degrees C (77 degrees F). Get rid of any unused medicine after the expiration date. To get rid of medicines that are no longer needed or have expired:  Take the medicine to a medicine take-back program. Check with your pharmacy or law enforcement to find a location.  If you cannot return the medicine, check the label or package insert to see if the medicine should be thrown out in the garbage or flushed down the toilet. If you are not sure, ask your health care provider. If it is safe to put into the trash, take the medicine out of the container. Mix the medicine with cat litter, dirt, coffee grounds, or other unwanted substance. Seal the mixture in a bag or container. Put it in the trash. NOTE: This sheet is a summary. It may not cover all possible information.  If you have questions about this medicine, talk to your doctor, pharmacist, or health care provider.  2021 Elsevier/Gold Standard (2019-11-15 19:57:34)     Signed, Angelena Form, PA-C  12/23/2020 8:06 PM    [facility] [address], [place], [place]  [address] Phone: [phone]; Fax: [phone]

## 2020-12-23 ENCOUNTER — Ambulatory Visit (HOSPITAL_COMMUNITY): Payer: BC Managed Care – PPO | Attending: Cardiology

## 2020-12-23 ENCOUNTER — Encounter: Payer: Self-pay | Admitting: Physician Assistant

## 2020-12-23 ENCOUNTER — Ambulatory Visit (INDEPENDENT_AMBULATORY_CARE_PROVIDER_SITE_OTHER): Payer: BC Managed Care – PPO | Admitting: Physician Assistant

## 2020-12-23 ENCOUNTER — Other Ambulatory Visit: Payer: Self-pay

## 2020-12-23 VITALS — BP 158/90 | HR 70 | Ht 73.0 in | Wt 245.0 lb

## 2020-12-23 DIAGNOSIS — I5022 Chronic systolic (congestive) heart failure: Secondary | ICD-10-CM

## 2020-12-23 DIAGNOSIS — I10 Essential (primary) hypertension: Secondary | ICD-10-CM

## 2020-12-23 DIAGNOSIS — Z952 Presence of prosthetic heart valve: Secondary | ICD-10-CM | POA: Diagnosis present

## 2020-12-23 DIAGNOSIS — Z79899 Other long term (current) drug therapy: Secondary | ICD-10-CM

## 2020-12-23 LAB — ECHOCARDIOGRAM COMPLETE
AV Mean grad: 32 mmHg
AV Peak grad: 45.9 mmHg
Ao pk vel: 3.39 m/s
Area-P 1/2: 3.17 cm2
S' Lateral: 2.9 cm

## 2020-12-23 MED ORDER — SPIRONOLACTONE 25 MG PO TABS: 25.0000 mg | ORAL_TABLET | Freq: Every day | ORAL | 3 refills | Status: DC

## 2020-12-23 NOTE — Patient Instructions (Addendum)
Medication Instructions:  Your physician has recommended you make the following change in your medication:  1. START Spironolactone 25 mg once daily  *If you need a refill on your cardiac medications before your next appointment, please call your pharmacy*   Lab Work: Today: BMET If you have labs (blood work) drawn today and your tests are completely normal, you will receive your results only by: [name] MyChart Message (if you have MyChart) OR . A paper copy in the mail If you have any lab test that is abnormal or we need to change your treatment, we will call you to review the results.   Testing/Procedures: None ordered   Follow-Up: At [facility], you and your health needs are our priority.  As part of our continuing mission to provide you with exceptional heart care, we have created designated Provider Care Teams.  These Care Teams include your primary Cardiologist (physician) and Advanced Practice Providers (APPs -  Physician Assistants and Nurse Practitioners) who all work together to provide you with the care you need, when you need it.  Your next appointment:   2 month(s)  The format for your next appointment:   In Person  Provider:   Daneen Schick, MD    Thank you for choosing [facility]!!     Other Instructions  Keep your follow up appointment with your primary doctor next month for follow up on your blood pressures and lab    Spironolactone Oral Tablets What is this medicine? SPIRONOLACTONE (speer on [place] LAK tone) is a diuretic. It helps you make more urine and to lose salt and excess water from your body. It treats swelling from heart, kidney, or liver disease. It treats high blood pressure. It also treats high aldosterone levels in the blood. This medicine may be used for other purposes; ask your health care provider or pharmacist if you have questions. COMMON BRAND NAME(S): Aldactone What should I tell my health care provider before I take this  medicine? They need to know if you have any of these conditions:  Addison's disease or low adrenal gland function  high blood level of potassium  kidney disease  liver disease  an unusual or allergic reaction to spironolactone, other medicines, foods, dyes, or preservatives  pregnant or trying to get pregnant  breast-feeding How should I use this medicine? Take this medicine by mouth. Take it as directed on the prescription label at the same time every day. You can take it with or without food. You should always take it the same way. Keep taking it unless your health care provider tells you to stop. Talk to your health care provider about the use of this drug in children. Special care may be needed. Overdosage: If you think you have taken too much of this medicine contact a poison control center or emergency room at once. NOTE: This medicine is only for you. Do not share this medicine with others. What if I miss a dose? If you miss a dose, take it as soon as you can. If it is almost time for your next dose, take only that dose. Do not take double or extra doses. What may interact with this medicine? Do not take this medicine with any of the following medications:  cidofovir  eplerenone  tranylcypromine This medicine may also interact with the following medications:  aspirin  certain medicines for blood pressure or heart disease like benazepril, lisinopril, losartan, valsartan  certain medicines that treat or prevent blood clots like heparin  and enoxaparin  cholestyramine  cyclosporine  digoxin  lithium  medicines that relax muscles for surgery  NSAIDs, medicines for pain and inflammation, like ibuprofen or naproxen  other diuretics  potassium salts or supplements  steroid medicines like prednisone or cortisone  trimethoprim This list may not describe all possible interactions. Give your health care provider a list of all the medicines, herbs, non-prescription  drugs, or dietary supplements you use. Also tell them if you smoke, drink alcohol, or use illegal drugs. Some items may interact with your medicine. What should I watch for while using this medicine? Visit your doctor or health care provider for regular checks on your progress. Check your blood pressure as directed. Ask your health care provider what your blood pressure should be. Also, find out when you should contact him or her. Do not treat yourself for coughs, colds, or pain while you are using this medicine without asking your health care provider for advice. Some medicines may increase your blood pressure. Check with your health care provider if you have severe diarrhea, nausea, and vomiting, or if you sweat a lot. The loss of too much body fluid may make it dangerous for you to take this medicine. You may need to be on a special diet while taking this medicine. Ask your health care provider. Also, find out how many glasses of fluid you need to drink each day. You may get drowsy or dizzy. Do not drive, use machinery, or do anything that needs mental alertness until you know how this medicine affects you. Do not stand or sit up quickly, especially if you are an older patient. This reduces the risk of dizzy or fainting spells. Alcohol may interfere with the effects of this medicine. Avoid alcoholic drinks. Avoid salt substitutes unless you are told otherwise by your health care provider. What side effects may I notice from receiving this medicine? Side effects that you should report to your health care provider as soon as possible:  allergic reactions (skin rash, itching or hives, swelling of the face, lips, or tongue)  breast enlargement in both males and females  changes in menstrual cycle  gout (severe pain, redness, or swelling in joints like the big toe)  high blood sugar (increased hunger, thirst or urination; unusually weak or tired, blurry vision)  high potassium levels (chest pain;  fast irregular heartbeat; muscle weakness)  kidney injury (trouble passing urine or change in the amount of urine)  low blood pressure (dizziness; feeling faint or lightheaded, falls; unusually weak or tired)  low calcium levels (fast heartbeat; muscle cramps or pain; pain, tingling, or numbness in the hands or feet; seizures)  low magnesium levels (fast, irregular heartbeat; feeling faint or lightheaded, falls; muscle cramps or pain) Side effects that usually do not require medical attention (report to your health care provider if they continue or are bothersome):  changes in sex drive or performance  dizziness  headache  upset stomach This list may not describe all possible side effects. Call your doctor for medical advice about side effects. You may report side effects to FDA at 1-800-FDA-1088. Where should I keep my medicine? Keep out of the reach of children and pets. Store below 25 degrees C (77 degrees F). Get rid of any unused medicine after the expiration date. To get rid of medicines that are no longer needed or have expired:  Take the medicine to a medicine take-back program. Check with your pharmacy or law enforcement to find a location.  If  you cannot return the medicine, check the label or package insert to see if the medicine should be thrown out in the garbage or flushed down the toilet. If you are not sure, ask your health care provider. If it is safe to put into the trash, take the medicine out of the container. Mix the medicine with cat litter, dirt, coffee grounds, or other unwanted substance. Seal the mixture in a bag or container. Put it in the trash. NOTE: This sheet is a summary. It may not cover all possible information. If you have questions about this medicine, talk to your doctor, pharmacist, or health care provider.  2021 [employer] (2019-11-15 19:57:34)

## 2020-12-24 ENCOUNTER — Telehealth: Payer: Self-pay

## 2020-12-24 LAB — BASIC METABOLIC PANEL
BUN/Creatinine Ratio: 14 (ref 10–24)
BUN: 16 mg/dL (ref 8–27)
CO2: 23 mmol/L (ref 20–29)
Calcium: 9.8 mg/dL (ref 8.6–10.2)
Chloride: 103 mmol/L (ref 96–106)
Creatinine, Ser: 1.17 mg/dL (ref 0.76–1.27)
Glucose: 88 mg/dL (ref 65–99)
Potassium: 3.7 mmol/L (ref 3.5–5.2)
Sodium: 142 mmol/L (ref 134–144)
eGFR: 65 mL/min/{1.73_m2} (ref >59)

## 2020-12-24 NOTE — Telephone Encounter (Signed)
Yes please let them know he has a history of hypokalemia and I checked his potassium yesterday and it was low end of normal and his labs will be rechecked next month

## 2020-12-24 NOTE — Telephone Encounter (Signed)
[facility] [facility] stating that pt was prescribed spironolactone and this combination with lisinopril and potassium may lead to hyperkalemia. Do Dalia Heading  still want to fill this Rx? Please address

## 2020-12-24 NOTE — Telephone Encounter (Signed)
Called pharmacy and spoke with Misha.  Advised per Kathlene November, PA she is aware, monitoring K+ levels and pt is to start medication as ordered.  Misha stated understanding and had no further questions/concerns.

## 2021-02-05 ENCOUNTER — Encounter: Payer: Self-pay | Admitting: Physician Assistant

## 2021-02-05 ENCOUNTER — Other Ambulatory Visit: Payer: Self-pay | Admitting: Physician Assistant

## 2021-02-18 ENCOUNTER — Ambulatory Visit: Payer: BC Managed Care – PPO | Admitting: Interventional Cardiology

## 2022-02-11 NOTE — Progress Notes (Signed)
Cardiology Office Note    Date:  02/23/2022   ID:  Douglas Hart, DOB 1946/05/31, MRN [id number]   PCP:  Street, Sharon Mt, MD   [place]  Cardiologist:  [name], MD   Advanced Practice Provider:  No care team member to display Electrophysiologist:  None   [id number]}   Chief Complaint  Patient presents with   Follow-up    History of Present Illness:  Douglas Hart is a 76 y.o. 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 (50-60% ostial Circumflex, 50-60% mid LAD stenosis).   He later developed a diffuse, pruritic maculopapular eruption felt to be related to plavix, which was discontinued. 1 month echo showed normally functioning TAVR with no AI but mean gradient more elevated from 18 mm hg on POD 1 echo to 31 mm Hg. Follow up cardiac CT showed no evidence of subclinical leaflet thrombosis. He was continued on aspirin alone.  Patient last saw Nell Range, PA-C 12/23/20 and BP elevated. Was supposed to start spiro after seeing Dr.Smith but this never happened. She started 25 mg daily.  Patient comes in for f/u. Needs a knee replacement and is using a cane. He hasn't scheduled it yet as he keeps putting it off. BP has been jumping around-110-140/70. Usually not as low as 323 systolic.  Denies chest pain, palpitations, dyspnea, dizziness or presyncope. No regular exercise other than yard work. Has exercise bike but not using regularly. Just retired from driving school bus.  Past Medical History:  Diagnosis Date   Asthma    Bicuspid aortic valve    Chronic diastolic (congestive) heart failure (HCC)    Hearing loss    Hypertension    Knee pain    Prosthetic valve dysfunction    S/P AVR (aortic valve replacement)     [id number] 2008 by Dr. GAzell DerTAVR (transcatheter aortic valve replacement) 12/17/2019   s/p TAVR with a 26 mm Edwards Sapien Ultra THV via the TF approach by Drs MAngelena Formand Bartle   Sleep apnea    uses cpap    Past Surgical History:  Procedure Laterality Date   CARDIAC SURGERY     RIGHT HEART CATH AND CORONARY/GRAFT ANGIOGRAPHY N/A 12/11/2019   Procedure: RIGHT HEART CATH AND CORONARY/GRAFT ANGIOGRAPHY;  Surgeon: SBelva Crome MD;  [facility];  Service: Cardiovascular;  Laterality: N/A;   TEE WITHOUT CARDIOVERSION N/A 12/17/2019   Procedure: TRANSESOPHAGEAL ECHOCARDIOGRAM (TEE);  Surgeon: MBurnell Blanks MD;  [facility];  Service: Open Heart Surgery;  Laterality: N/A;   TRANSCATHETER AORTIC VALVE REPLACEMENT, TRANSFEMORAL N/A 12/17/2019   Procedure: TRANSCATHETER AORTIC VALVE REPLACEMENT, TRANSFEMORAL;  Surgeon: MBurnell Blanks MD;  [facility];  Service: Open Heart Surgery;  Laterality: N/A;    Current Medications: No outpatient medications have been marked as taking for the 02/23/22 encounter (Office Visit) with LImogene Burn PA-C.     Allergies:   Azithromycin, Plavix [clopidogrel], and Spironolactone   Social History   Socioeconomic History   Marital status: Married    Spouse name: Not on file   Number of children: Not on file   Years of  education: Not on file   Highest education level: Not on file  Occupational History   Not on file  Tobacco Use   Smoking status: Never   Smokeless tobacco: Never  Vaping Use   Vaping Use: Never used  Substance and Sexual Activity   Alcohol use: No   Drug use: No   Sexual activity: Not on file  Other Topics Concern   Not on file  Social History Narrative   Not on file   Social Determinants of Health   Financial Resource Strain: Not on file  Food Insecurity: Not on file  Transportation Needs: Not  on file  Physical Activity: Not on file  Stress: Not on file  Social Connections: Not on file     Family History:  The patient's  family history includes Alzheimer's disease in his mother; Heart disease in his brother; Heart disease (age of onset: 67) in his father; Hypertension in his brother and father; Leukemia (age of onset: 20) in his brother; Other in his daughter, daughter, and sister.   ROS:   Please see the history of present illness.    ROS All other systems reviewed and are negative.   PHYSICAL EXAM:   VS:  Pulse 69   Ht '6\' 1"'$  (1.854 m)   Wt 243 lb (110.2 kg)   SpO2 97%   BMI 32.06 kg/m   Physical Exam  GEN: Well nourished, well developed, in no acute distress  Neck: no JVD, carotid bruits, or masses Cardiac:RRR; no murmurs, rubs, or gallops  Respiratory:  clear to auscultation bilaterally, normal work of breathing GI: soft, nontender, nondistended, + BS Ext: without cyanosis, clubbing, or edema, Good distal pulses bilaterally Neuro:  Alert and Oriented x 3,  Psych: euthymic mood, full affect  Wt Readings from Last 3 Encounters:  02/23/22 243 lb (110.2 kg)  12/23/20 245 lb (111.1 kg)  07/10/20 234 lb (106.1 kg)      Studies/Labs Reviewed:   EKG:  EKG is  ordered today.  The ekg ordered today demonstrates NSR with first degree AV block, LVH  Recent Labs: No results found for requested labs within last 365 days.   Lipid Panel    Component Value Date/Time   CHOL 165 12/13/2019 0217   TRIG 98 12/13/2019 0217   HDL 40 (L) 12/13/2019 0217   CHOLHDL 4.1 12/13/2019 0217   VLDL 20 12/13/2019 0217   LDLCALC 105 (H) 12/13/2019 0217    Additional studies/ records that were reviewed today include:  TAVR: 12/17/19    Procedure:        Transcatheter Aortic Valve Replacement Valve in Valve- Transfemoral Approach             Edwards Sapien 3 THV (size 26 mm, model # L876275, serial # [id number])              Co-Surgeons:                        Lauree Chandler, MD and Gaye Pollack, MD    Anesthesiologist:                  Smith Robert   Echocardiographer:              Croitoru   Pre-operative Echo Findings: Severe aortic stenosis Normal left ventricular systolic function   Post-operative Echo Findings: No paravalvular leak Normal left ventricular systolic function _____________   Echo 12/18/19  IMPRESSIONS   1. Left ventricular  ejection fraction, by estimation, is 55 to 60%. The  left ventricle has normal function. The left ventricle has no regional  wall motion abnormalities. There is mild concentric left ventricular  hypertrophy. Left ventricular diastolic  parameters are consistent with Grade II diastolic dysfunction  (pseudonormalization). Elevated left atrial pressure.   2. Right ventricular systolic function is normal. The right ventricular  size is normal. There is mildly elevated pulmonary artery systolic  pressure. The estimated right ventricular systolic pressure is 50.0 mmHg.   3. Left atrial size was severely dilated.   4. Right atrial size was mildly dilated.   5. The mitral valve is normal in structure. Trivial mitral valve  regurgitation. No evidence of mitral stenosis.   6. The aortic valve has been repaired/replaced. Aortic valve  regurgitation is not visualized. No aortic stenosis is present. There is a  26 mm Edwards Sapien prosthetic (TAVR) valve present in the aortic  position. Procedure Date: 12/17/2019. Echo findings   are consistent with normal structure and function of the aortic valve  prosthesis. Aortic valve mean gradient measures 18.0 mmHg. Aortic valve  Vmax measures 2.82 m/s.   7. The inferior vena cava is dilated in size with <50% respiratory  variability, suggesting right atrial pressure of 15 mmHg.    _____________   Echo 01/16/20 IMPRESSIONS   1. Left ventricular ejection fraction, by estimation, is 55 to 60%. The left ventricle has normal function. The left ventricle has no regional  wall  motion abnormalities. There is mild concentric left ventricular  hypertrophy. Left ventricular diastolic  parameters are consistent with Grade II diastolic dysfunction  (pseudonormalization).   2. Right ventricular systolic function is normal. The right ventricular  size is normal. There is normal pulmonary artery systolic pressure.   3. Left atrial size was mildly dilated.   4. The mitral valve is normal in structure. No evidence of mitral valve  regurgitation. No evidence of mitral stenosis.   5. The mean aortic valve gradient has increased form 18 on the immediate  post procedure to 31 on todays echo. I have discussed with Nell Range,  PA for the structural heart team. she will get a CTA of the aortic valve  to look for thrombus formation  on the AV .   The aortic valve has been repaired/replaced. Aortic valve  regurgitation is not visualized. Mild to moderate aortic valve stenosis.  Procedure Date: 12/17/19.    _________________   Cardiac CT 01/22/20 IMPRESSION: 1. Well positioned and expanded 26 mm Sapien 3 valve in valve TAVR with no HAM/HALT or evidence of sub acute thrombosis 2.  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 Left ventricular ejection fraction, by estimation, is 60 to 65%. The left ventricle has normal function. The left ventricle has no regional wall motion abnormalities. There is mild left ventricular hypertrophy. Left ventricular diastolic parameters  are consistent with Grade I diastolic dysfunction (impaired relaxation).  2. Right ventricular systolic function is normal. The right ventricular size is mildly enlarged. Tricuspid regurgitation signal is inadequate for assessing PA pressure.  3. Left atrial size was mildly dilated.  4. Right atrial size was mildly dilated.  5. The mitral valve is normal in structure. Trivial mitral valve regurgitation. No evidence of mitral stenosis.  6. Bioprosthetic aortic  valve s/p TAVR with 25 mm Edwards Sapien THV. No significant peri-valvular leakage. The mean gradient across the valve was moderately elevated to 32 mmHg.  7. Aortic  dilatation noted. There is mild dilatation of the aortic root, measuring 38 mm.  8. The inferior vena cava is normal in size with greater than 50% respiratory variability, suggesting right atrial pressure of 3 mmHg.     Risk Assessment/Calculations:         ASSESSMENT:    1. S/P TAVR (transcatheter aortic valve replacement)   2. Chronic diastolic CHF (congestive heart failure) ([place])   3. Essential hypertension   4. Mixed hyperlipidemia   5. Coronary artery disease involving native coronary artery of native heart without angina pectoris      PLAN:  In order of problems listed above:  S/P TAVR 2021(bioprosthetic valve 2008)-SBE prophylaxis with amoxicillin. Will update 2Decho and increase exercise  Chronic diastolic CHF EF 62-83% on echo 12/2020-compensated today  HTN BP up and down- 2 gm sodium diet  HLD PCP drawing labs in 2 weeks  CAD 50-60% oCfx and mid LAD on cath 11/2019-no angina  OSA on CPAP  Shared Decision Making/Informed Consent        Medication Adjustments/Labs and Tests Ordered: Current medicines are reviewed at length with the patient today.  Concerns regarding medicines are outlined above.  Medication changes, Labs and Tests ordered today are listed in the Patient Instructions below. Patient Instructions  Medication Instructions:  Your physician recommends that you continue on your current medications as directed. Please refer to the Current Medication list given to you today.  *If you need a refill on your cardiac medications before your next appointment, please call your pharmacy*   Lab Work: None If you have labs (blood work) drawn today and your tests are completely normal, you will receive your results only by: [place] (if you have MyChart) OR A paper copy in the mail If you  have any lab test that is abnormal or we need to change your treatment, we will call you to review the results.   Testing/Procedures: Your physician has requested that you have an echocardiogram. Echocardiography is a painless test that uses sound waves to create images of your heart. It provides your doctor with information about the size and shape of your heart and how well your heart's chambers and valves are working. This procedure takes approximately one hour. There are no restrictions for this procedure.   Follow-Up: At [facility], you and your health needs are our priority.  As part of our continuing mission to provide you with exceptional heart care, we have created designated Provider Care Teams.  These Care Teams include your primary Cardiologist (physician) and Advanced Practice Providers (APPs -  Physician Assistants and Nurse Practitioners) who all work together to provide you with the care you need, when you need it.  Your next appointment:   1 year(s)  The format for your next appointment:   In Person  Provider:   [name], MD     Other Instructions Your provider recommends that you maintain 150 minutes per week of moderate aerobic activity.  Two Gram Sodium Diet 2000 mg  What is Sodium? Sodium is a mineral found naturally in many foods. The most significant source of sodium in the diet is table salt, which is about 40% sodium.  Processed, convenience, and preserved foods also contain a large amount of sodium.  The body needs only 500 mg of sodium daily to function,  A normal diet provides more than enough sodium even if you do not use salt.  Why Limit Sodium? A build up of sodium in  the body can cause thirst, increased blood pressure, shortness of breath, and water retention.  Decreasing sodium in the diet can reduce edema and risk of heart attack or stroke associated with high blood pressure.  Keep in mind that there are many other factors involved in these  health problems.  Heredity, obesity, lack of exercise, cigarette smoking, stress and what you eat all play a role.  General Guidelines: Do not add salt at the table or in cooking.  One teaspoon of salt contains over 2 grams of sodium. Read food labels Avoid processed and convenience foods Ask your dietitian before eating any foods not dicussed in the menu planning guidelines Consult your physician if you wish to use a salt substitute or a sodium containing medication such as antacids.  Limit milk and milk products to 16 oz (2 cups) per day.  Shopping Hints: READ LABELS!! "Dietetic" does not necessarily mean low sodium. Salt and other sodium ingredients are often added to foods during processing.    Menu Planning Guidelines Food Group Choose More Often Avoid  Beverages (see also the milk group All fruit juices, low-sodium, salt-free vegetables juices, low-sodium carbonated beverages Regular vegetable or tomato juices, commercially softened water used for drinking or cooking  Breads and Cereals Enriched white, wheat, rye and pumpernickel bread, hard rolls and dinner rolls; muffins, cornbread and waffles; most dry cereals, cooked cereal without added salt; unsalted crackers and breadsticks; low sodium or homemade bread crumbs Bread, rolls and crackers with salted tops; quick breads; instant hot cereals; pancakes; commercial bread stuffing; self-rising flower and biscuit mixes; regular bread crumbs or cracker crumbs  Desserts and Sweets Desserts and sweets mad with mild should be within allowance Instant pudding mixes and cake mixes  Fats Butter or margarine; vegetable oils; unsalted salad dressings, regular salad dressings limited to 1 Tbs; light, sour and heavy cream Regular salad dressings containing bacon fat, bacon bits, and salt pork; snack dips made with instant soup mixes or processed cheese; salted nuts  Fruits Most fresh, frozen and canned fruits Fruits processed with salt or  sodium-containing ingredient (some dried fruits are processed with sodium sulfites        Vegetables Fresh, frozen vegetables and low- sodium canned vegetables Regular canned vegetables, sauerkraut, pickled vegetables, and others prepared in brine; frozen vegetables in sauces; vegetables seasoned with ham, bacon or salt pork  Condiments, Sauces, Miscellaneous  Salt substitute with physician's approval; pepper, herbs, spices; vinegar, lemon or lime juice; hot pepper sauce; garlic powder, onion powder, low sodium soy sauce (1 Tbs.); low sodium condiments (ketchup, chili sauce, mustard) in limited amounts (1 tsp.) fresh ground horseradish; unsalted tortilla chips, pretzels, potato chips, popcorn, salsa (1/4 cup) Any seasoning made with salt including garlic salt, celery salt, onion salt, and seasoned salt; sea salt, rock salt, kosher salt; meat tenderizers; monosodium glutamate; mustard, regular soy sauce, barbecue, sauce, chili sauce, teriyaki sauce, steak sauce, Worcestershire sauce, and most flavored vinegars; canned gravy and mixes; regular condiments; salted snack foods, olives, picles, relish, horseradish sauce, catsup   Food preparation: Try these seasonings Meats:    Pork Sage, onion Serve with applesauce  Chicken Poultry seasoning, thyme, parsley Serve with cranberry sauce  Lamb Curry powder, rosemary, garlic, thyme Serve with mint sauce or jelly  Veal Marjoram, basil Serve with current jelly, cranberry sauce  Beef Pepper, bay leaf Serve with dry mustard, unsalted chive butter  Fish [place] leaf, dill Serve with unsalted lemon butter, unsalted parsley butter  Vegetables:    Asparagus  Lemon juice   Broccoli Lemon juice   Carrots Mustard dressing parsley, mint, nutmeg, glazed with unsalted butter and sugar   Green beans Marjoram, lemon juice, nutmeg,dill seed   Tomatoes Basil, marjoram, onion   Spice /blend for [employer]" 4 tsp ground thyme 1 tsp ground sage 3 tsp ground rosemary 4 tsp  ground marjoram   Test your knowledge A product that says "Salt Free" may still contain sodium. True or False Garlic Powder and Hot Pepper Sauce an be used as alternative seasonings.True or False Processed foods have more sodium than fresh foods.  True or False Canned Vegetables have less sodium than froze True or False   WAYS TO DECREASE YOUR SODIUM INTAKE Avoid the use of added salt in cooking and at the table.  Table salt (and other prepared seasonings which contain salt) is probably one of the greatest sources of sodium in the diet.  Unsalted foods can gain flavor from the sweet, sour, and butter taste sensations of herbs and spices.  Instead of using salt for seasoning, try the following seasonings with the foods listed.  Remember: how you use them to enhance natural food flavors is limited only by your creativity... Allspice-Meat, fish, eggs, fruit, peas, red and yellow vegetables Almond Extract-Fruit baked goods Anise Seed-Sweet breads, fruit, carrots, beets, cottage cheese, cookies (tastes like licorice) Basil-Meat, fish, eggs, vegetables, rice, vegetables salads, soups, sauces Bay Leaf-Meat, fish, stews, poultry Burnet-Salad, vegetables (cucumber-like flavor) Caraway Seed-Bread, cookies, cottage cheese, meat, vegetables, cheese, rice Cardamon-Baked goods, fruit, soups Celery Powder or seed-Salads, salad dressings, sauces, meatloaf, soup, bread.Do not use  celery salt Chervil-Meats, salads, fish, eggs, vegetables, cottage cheese (parsley-like flavor) Chili Power-Meatloaf, chicken cheese, corn, eggplant, egg dishes Chives-Salads cottage cheese, egg dishes, soups, vegetables, sauces Cilantro-Salsa, casseroles Cinnamon-Baked goods, fruit, pork, lamb, chicken, carrots Cloves-Fruit, baked goods, fish, pot roast, green beans, beets, carrots Coriander-Pastry, cookies, meat, salads, cheese (lemon-orange flavor) Cumin-Meatloaf, fish,cheese, eggs, cabbage,fruit pie (caraway flavor) [employer], fruit, eggs, fish, poultry, cottage cheese, vegetables Dill Seed-Meat, cottage cheese, poultry, vegetables, fish, salads, bread Fennel Seed-Bread, cookies, apples, pork, eggs, fish, beets, cabbage, cheese, Licorice-like flavor Garlic-(buds or powder) Salads, meat, poultry, fish, bread, butter, vegetables, potatoes.Do not  use garlic salt Ginger-Fruit, vegetables, baked goods, meat, fish, poultry Horseradish Root-Meet, vegetables, butter Lemon Juice or Extract-Vegetables, fruit, tea, baked goods, fish salads Mace-Baked goods fruit, vegetables, fish, poultry (taste like nutmeg) Maple Extract-Syrups Marjoram-Meat, chicken, fish, vegetables, breads, green salads (taste like Sage) Mint-Tea, lamb, sherbet, vegetables, desserts, carrots, cabbage Mustard, Dry or Seed-Cheese, eggs, meats, vegetables, poultry Nutmeg-Baked goods, fruit, chicken, eggs, vegetables, desserts Onion Powder-Meat, fish, poultry, vegetables, cheese, eggs, bread, rice salads (Do not use   Onion salt) Orange Extract-Desserts, baked goods Oregano-Pasta, eggs, cheese, onions, pork, lamb, fish, chicken, vegetables, green salads Paprika-Meat, fish, poultry, eggs, cheese, vegetables Parsley Flakes-Butter, vegetables, meat fish, poultry, eggs, bread, salads (certain forms may   Contain sodium Pepper-Meat fish, poultry, vegetables, eggs Peppermint Extract-Desserts, baked goods Poppy Seed-Eggs, bread, cheese, fruit dressings, baked goods, noodles, vegetables, cottage  [employer], poultry, meat, fish, cauliflower, turnips,eggs bread Saffron-Rice, bread, veal, chicken, fish, eggs Sage-Meat, fish, poultry, onions, eggplant, tomateos, pork, stews Savory-Eggs, salads, poultry, meat, rice, vegetables, soups, pork Tarragon-Meat, poultry, fish, eggs, butter, vegetables (licorice-like flavor)  Thyme-Meat, poultry, fish, eggs, vegetables, (clover-like flavor), sauces,  soups Tumeric-Salads, butter, eggs, fish, rice, vegetables (saffron-like flavor) Vanilla Extract-Baked goods, candy Vinegar-Salads, vegetables, meat marinades Walnut Extract-baked goods, candy   2. Choose your Foods Wisely  The following is a list of foods to avoid which are high in sodium:  Meats-Avoid all smoked, canned, salt cured, dried and kosher meat and fish as well as Anchovies   [employer] meats:Bologna, Liverwurst, Pastrami Canned meat or fish  Marinated herring Caviar    Pepperoni Corned Beef   Pizza Dried chipped beef  Salami Frozen breaded fish or meat Salt pork Frankfurters or hot dogs  Sardines Gefilte fish   Sausage Ham (boiled ham, Proscuitto Smoked butt    spiced ham)   Spam      TV Dinners Vegetables Canned vegetables (Regular) Relish Canned mushrooms  Sauerkraut Olives    Tomato juice Pickles  Bakery and Dessert Products Canned puddings  Cream pies Cheesecake   Decorated cakes Cookies  Beverages/Juices Tomato juice, regular  Gatorade   V-8 vegetable juice, regular  Breads and Cereals Biscuit mixes   Salted potato chips, corn chips, pretzels Bread stuffing mixes  Salted crackers and rolls Pancake and waffle mixes Self-rising flour  Seasonings Accent    Meat sauces Barbecue sauce  Meat tenderizer Catsup    Monosodium glutamate (MSG) Celery salt   Onion salt Chili sauce   Prepared mustard Garlic salt   Salt, seasoned salt, sea salt Gravy mixes   Soy sauce Horseradish   Steak sauce Ketchup   Tartar sauce Lite salt    Teriyaki sauce Marinade mixes   Worcestershire sauce  Others Baking powder   Cocoa and cocoa mixes Baking soda   Commercial casserole mixes Candy-caramels, chocolate  Dehydrated soups    Bars, fudge,nougats  Instant rice and pasta mixes Canned broth or soup  Maraschino cherries Cheese, aged and processed cheese and cheese spreads  Learning Assessment Quiz  Indicated T (for True) or F (for False) for each of the  following statements:  _____ Fresh fruits and vegetables and unprocessed grains are generally low in sodium _____ Water may contain a considerable amount of sodium, depending on the source _____ You can always tell if a food is high in sodium by tasting it _____ Certain laxatives my be high in sodium and should be avoided unless prescribed   by a physician or pharmacist _____ Salt substitutes may be used freely by anyone on a sodium restricted diet _____ Sodium is present in table salt, food additives and as a natural component of   most foods _____ Table salt is approximately 90% sodium _____ Limiting sodium intake may help prevent excess fluid accumulation in the body _____ On a sodium-restricted diet, seasonings such as bouillon soy sauce, and    cooking wine should be used in place of table salt _____ On an ingredient list, a product which lists monosodium glutamate as the first   ingredient is an appropriate food to include on a low sodium diet  Circle the best answer(s) to the following statements (Hint: there may be more than one correct answer)  11. On a low-sodium diet, some acceptable snack items are:    A. Olives  F. Bean dip   K. Grapefruit juice    B. Salted Pretzels G. Commercial Popcorn   L. Canned peaches    C. Carrot Sticks  H. Bouillon   M. Unsalted nuts   D. [place] fries  I. Peanut butter crackers N. Salami   E. Sweet pickles J. Tomato Juice   O. Pizza  12.  Seasonings that may be used freely on a reduced - sodium diet include   A. Lemon wedges F.Monosodium  glutamate K. Celery seed    B.Soysauce   G. Pepper   L. Mustard powder   C. Sea salt  H. Cooking wine  M. Onion flakes   D. Vinegar  E. Prepared horseradish N. Salsa   E. Sage   J. Worcestershire sauce  O. Chutney      Signed, Ermalinda Barrios, PA-C  02/23/2022 1:16 PM    [facility] [place], [place], [place]  [id number] Phone: [phone]; Fax: [phone]

## 2022-02-23 ENCOUNTER — Encounter: Payer: Self-pay | Admitting: Physician Assistant

## 2022-02-23 ENCOUNTER — Telehealth: Payer: Self-pay

## 2022-02-23 ENCOUNTER — Ambulatory Visit (INDEPENDENT_AMBULATORY_CARE_PROVIDER_SITE_OTHER): Payer: BC Managed Care – PPO | Admitting: Physician Assistant

## 2022-02-23 VITALS — BP 142/84 | HR 69 | Ht 73.0 in | Wt 243.0 lb

## 2022-02-23 DIAGNOSIS — I2510 Atherosclerotic heart disease of native coronary artery without angina pectoris: Secondary | ICD-10-CM

## 2022-02-23 DIAGNOSIS — I5032 Chronic diastolic (congestive) heart failure: Secondary | ICD-10-CM

## 2022-02-23 DIAGNOSIS — Z952 Presence of prosthetic heart valve: Secondary | ICD-10-CM | POA: Diagnosis not present

## 2022-02-23 DIAGNOSIS — I10 Essential (primary) hypertension: Secondary | ICD-10-CM

## 2022-02-23 DIAGNOSIS — E782 Mixed hyperlipidemia: Secondary | ICD-10-CM

## 2022-02-23 MED ORDER — LISINOPRIL 40 MG PO TABS: 40.0000 mg | ORAL_TABLET | Freq: Every day | ORAL | 3 refills | Status: AC

## 2022-02-23 MED ORDER — LISINOPRIL 40 MG PO TABS: 40.0000 mg | ORAL_TABLET | Freq: Every day | ORAL | 3 refills | Status: DC

## 2022-02-23 NOTE — Telephone Encounter (Signed)
Called and cancelled Rx for Lisinopril. It was supposed to be sent to [facility].

## 2022-02-23 NOTE — Patient Instructions (Signed)
Medication Instructions:  Your physician recommends that you continue on your current medications as directed. Please refer to the Current Medication list given to you today.  *If you need a refill on your cardiac medications before your next appointment, please call your pharmacy*   Lab Work: None If you have labs (blood work) drawn today and your tests are completely normal, you will receive your results only by: [place] (if you have MyChart) OR A paper copy in the mail If you have any lab test that is abnormal or we need to change your treatment, we will call you to review the results.   Testing/Procedures: Your physician has requested that you have an echocardiogram. Echocardiography is a painless test that uses sound waves to create images of your heart. It provides your doctor with information about the size and shape of your heart and how well your heart's chambers and valves are working. This procedure takes approximately one hour. There are no restrictions for this procedure.   Follow-Up: At [facility], you and your health needs are our priority.  As part of our continuing mission to provide you with exceptional heart care, we have created designated Provider Care Teams.  These Care Teams include your primary Cardiologist (physician) and Advanced Practice Providers (APPs -  Physician Assistants and Nurse Practitioners) who all work together to provide you with the care you need, when you need it.  Your next appointment:   1 year(s)  The format for your next appointment:   In Person  Provider:   [name], MD     Other Instructions Your provider recommends that you maintain 150 minutes per week of moderate aerobic activity.  Two Gram Sodium Diet 2000 mg  What is Sodium? Sodium is a mineral found naturally in many foods. The most significant source of sodium in the diet is table salt, which is about 40% sodium.  Processed, convenience, and preserved  foods also contain a large amount of sodium.  The body needs only 500 mg of sodium daily to function,  A normal diet provides more than enough sodium even if you do not use salt.  Why Limit Sodium? A build up of sodium in the body can cause thirst, increased blood pressure, shortness of breath, and water retention.  Decreasing sodium in the diet can reduce edema and risk of heart attack or stroke associated with high blood pressure.  Keep in mind that there are many other factors involved in these health problems.  Heredity, obesity, lack of exercise, cigarette smoking, stress and what you eat all play a role.  General Guidelines: Do not add salt at the table or in cooking.  One teaspoon of salt contains over 2 grams of sodium. Read food labels Avoid processed and convenience foods Ask your dietitian before eating any foods not dicussed in the menu planning guidelines Consult your physician if you wish to use a salt substitute or a sodium containing medication such as antacids.  Limit milk and milk products to 16 oz (2 cups) per day.  Shopping Hints: READ LABELS!! "Dietetic" does not necessarily mean low sodium. Salt and other sodium ingredients are often added to foods during processing.    Menu Planning Guidelines Food Group Choose More Often Avoid  Beverages (see also the milk group All fruit juices, low-sodium, salt-free vegetables juices, low-sodium carbonated beverages Regular vegetable or tomato juices, commercially softened water used for drinking or cooking  Breads and Cereals Enriched white, wheat, rye and  pumpernickel bread, hard rolls and dinner rolls; muffins, cornbread and waffles; most dry cereals, cooked cereal without added salt; unsalted crackers and breadsticks; low sodium or homemade bread crumbs Bread, rolls and crackers with salted tops; quick breads; instant hot cereals; pancakes; commercial bread stuffing; self-rising flower and biscuit mixes; regular bread crumbs or  cracker crumbs  Desserts and Sweets Desserts and sweets mad with mild should be within allowance Instant pudding mixes and cake mixes  Fats Butter or margarine; vegetable oils; unsalted salad dressings, regular salad dressings limited to 1 Tbs; light, sour and heavy cream Regular salad dressings containing bacon fat, bacon bits, and salt pork; snack dips made with instant soup mixes or processed cheese; salted nuts  Fruits Most fresh, frozen and canned fruits Fruits processed with salt or sodium-containing ingredient (some dried fruits are processed with sodium sulfites        Vegetables Fresh, frozen vegetables and low- sodium canned vegetables Regular canned vegetables, sauerkraut, pickled vegetables, and others prepared in brine; frozen vegetables in sauces; vegetables seasoned with ham, bacon or salt pork  Condiments, Sauces, Miscellaneous  Salt substitute with physician's approval; pepper, herbs, spices; vinegar, lemon or lime juice; hot pepper sauce; garlic powder, onion powder, low sodium soy sauce (1 Tbs.); low sodium condiments (ketchup, chili sauce, mustard) in limited amounts (1 tsp.) fresh ground horseradish; unsalted tortilla chips, pretzels, potato chips, popcorn, salsa (1/4 cup) Any seasoning made with salt including garlic salt, celery salt, onion salt, and seasoned salt; sea salt, rock salt, kosher salt; meat tenderizers; monosodium glutamate; mustard, regular soy sauce, barbecue, sauce, chili sauce, teriyaki sauce, steak sauce, Worcestershire sauce, and most flavored vinegars; canned gravy and mixes; regular condiments; salted snack foods, olives, picles, relish, horseradish sauce, catsup   Food preparation: Try these seasonings Meats:    Pork Sage, onion Serve with applesauce  Chicken Poultry seasoning, thyme, parsley Serve with cranberry sauce  Lamb Curry powder, rosemary, garlic, thyme Serve with mint sauce or jelly  Veal Marjoram, basil Serve with current jelly, cranberry  sauce  Beef Pepper, bay leaf Serve with dry mustard, unsalted chive butter  Fish [place] leaf, dill Serve with unsalted lemon butter, unsalted parsley butter  Vegetables:    Asparagus Lemon juice   Broccoli Lemon juice   Carrots Mustard dressing parsley, mint, nutmeg, glazed with unsalted butter and sugar   Green beans Marjoram, lemon juice, nutmeg,dill seed   Tomatoes Basil, marjoram, onion   Spice /blend for [employer]" 4 tsp ground thyme 1 tsp ground sage 3 tsp ground rosemary 4 tsp ground marjoram   Test your knowledge A product that says "Salt Free" may still contain sodium. True or False Garlic Powder and Hot Pepper Sauce an be used as alternative seasonings.True or False Processed foods have more sodium than fresh foods.  True or False Canned Vegetables have less sodium than froze True or False   WAYS TO DECREASE YOUR SODIUM INTAKE Avoid the use of added salt in cooking and at the table.  Table salt (and other prepared seasonings which contain salt) is probably one of the greatest sources of sodium in the diet.  Unsalted foods can gain flavor from the sweet, sour, and butter taste sensations of herbs and spices.  Instead of using salt for seasoning, try the following seasonings with the foods listed.  Remember: how you use them to enhance natural food flavors is limited only by your creativity... Allspice-Meat, fish, eggs, fruit, peas, red and yellow vegetables Almond Extract-Fruit baked goods Anise Seed-Sweet  breads, fruit, carrots, beets, cottage cheese, cookies (tastes like licorice) Basil-Meat, fish, eggs, vegetables, rice, vegetables salads, soups, sauces Bay Leaf-Meat, fish, stews, poultry Burnet-Salad, vegetables (cucumber-like flavor) Caraway Seed-Bread, cookies, cottage cheese, meat, vegetables, cheese, rice Cardamon-Baked goods, fruit, soups Celery Powder or seed-Salads, salad dressings, sauces, meatloaf, soup, bread.Do not use  celery salt Chervil-Meats, salads, fish,  eggs, vegetables, cottage cheese (parsley-like flavor) Chili Power-Meatloaf, chicken cheese, corn, eggplant, egg dishes Chives-Salads cottage cheese, egg dishes, soups, vegetables, sauces Cilantro-Salsa, casseroles Cinnamon-Baked goods, fruit, pork, lamb, chicken, carrots Cloves-Fruit, baked goods, fish, pot roast, green beans, beets, carrots Coriander-Pastry, cookies, meat, salads, cheese (lemon-orange flavor) Cumin-Meatloaf, fish,cheese, eggs, cabbage,fruit pie (caraway flavor) [employer], fruit, eggs, fish, poultry, cottage cheese, vegetables Dill Seed-Meat, cottage cheese, poultry, vegetables, fish, salads, bread Fennel Seed-Bread, cookies, apples, pork, eggs, fish, beets, cabbage, cheese, Licorice-like flavor Garlic-(buds or powder) Salads, meat, poultry, fish, bread, butter, vegetables, potatoes.Do not  use garlic salt Ginger-Fruit, vegetables, baked goods, meat, fish, poultry Horseradish Root-Meet, vegetables, butter Lemon Juice or Extract-Vegetables, fruit, tea, baked goods, fish salads Mace-Baked goods fruit, vegetables, fish, poultry (taste like nutmeg) Maple Extract-Syrups Marjoram-Meat, chicken, fish, vegetables, breads, green salads (taste like Sage) Mint-Tea, lamb, sherbet, vegetables, desserts, carrots, cabbage Mustard, Dry or Seed-Cheese, eggs, meats, vegetables, poultry Nutmeg-Baked goods, fruit, chicken, eggs, vegetables, desserts Onion Powder-Meat, fish, poultry, vegetables, cheese, eggs, bread, rice salads (Do not use   Onion salt) Orange Extract-Desserts, baked goods Oregano-Pasta, eggs, cheese, onions, pork, lamb, fish, chicken, vegetables, green salads Paprika-Meat, fish, poultry, eggs, cheese, vegetables Parsley Flakes-Butter, vegetables, meat fish, poultry, eggs, bread, salads (certain forms may   Contain sodium Pepper-Meat fish, poultry, vegetables, eggs Peppermint Extract-Desserts, baked goods Poppy Seed-Eggs, bread, cheese, fruit dressings, baked  goods, noodles, vegetables, cottage  [employer], poultry, meat, fish, cauliflower, turnips,eggs bread Saffron-Rice, bread, veal, chicken, fish, eggs Sage-Meat, fish, poultry, onions, eggplant, tomateos, pork, stews Savory-Eggs, salads, poultry, meat, rice, vegetables, soups, pork Tarragon-Meat, poultry, fish, eggs, butter, vegetables (licorice-like flavor)  Thyme-Meat, poultry, fish, eggs, vegetables, (clover-like flavor), sauces, soups Tumeric-Salads, butter, eggs, fish, rice, vegetables (saffron-like flavor) Vanilla Extract-Baked goods, candy Vinegar-Salads, vegetables, meat marinades Walnut Extract-baked goods, candy   2. Choose your Foods Wisely   The following is a list of foods to avoid which are high in sodium:  Meats-Avoid all smoked, canned, salt cured, dried and kosher meat and fish as well as Anchovies   [employer] meats:Bologna, Liverwurst, Pastrami Canned meat or fish  Marinated herring Caviar    Pepperoni Corned Beef   Pizza Dried chipped beef  Salami Frozen breaded fish or meat Salt pork Frankfurters or hot dogs  Sardines Gefilte fish   Sausage Ham (boiled ham, Proscuitto Smoked butt    spiced ham)   Spam      TV Dinners Vegetables Canned vegetables (Regular) Relish Canned mushrooms  Sauerkraut Olives    Tomato juice Pickles  Bakery and Dessert Products Canned puddings  Cream pies Cheesecake   Decorated cakes Cookies  Beverages/Juices Tomato juice, regular  Gatorade   V-8 vegetable juice, regular  Breads and Cereals Biscuit mixes   Salted potato chips, corn chips, pretzels Bread stuffing mixes  Salted crackers and rolls Pancake and waffle mixes Self-rising flour  Seasonings Accent    Meat sauces Barbecue sauce  Meat tenderizer Catsup    Monosodium glutamate (MSG) Celery salt   Onion salt Chili sauce   Prepared mustard Garlic salt   Salt, seasoned salt, sea salt Gravy mixes  Soy  sauce Horseradish   Steak sauce Ketchup   Tartar sauce Lite salt    Teriyaki sauce Marinade mixes   Worcestershire sauce  Others Baking powder   Cocoa and cocoa mixes Baking soda   Commercial casserole mixes Candy-caramels, chocolate  Dehydrated soups    Bars, fudge,nougats  Instant rice and pasta mixes Canned broth or soup  Maraschino cherries Cheese, aged and processed cheese and cheese spreads  Learning Assessment Quiz  Indicated T (for True) or F (for False) for each of the following statements:  _____ Fresh fruits and vegetables and unprocessed grains are generally low in sodium _____ Water may contain a considerable amount of sodium, depending on the source _____ You can always tell if a food is high in sodium by tasting it _____ Certain laxatives my be high in sodium and should be avoided unless prescribed   by a physician or pharmacist _____ Salt substitutes may be used freely by anyone on a sodium restricted diet _____ Sodium is present in table salt, food additives and as a natural component of   most foods _____ Table salt is approximately 90% sodium _____ Limiting sodium intake may help prevent excess fluid accumulation in the body _____ On a sodium-restricted diet, seasonings such as bouillon soy sauce, and    cooking wine should be used in place of table salt _____ On an ingredient list, a product which lists monosodium glutamate as the first   ingredient is an appropriate food to include on a low sodium diet  Circle the best answer(s) to the following statements (Hint: there may be more than one correct answer)  11. On a low-sodium diet, some acceptable snack items are:    A. Olives  F. Bean dip   K. Grapefruit juice    B. Salted Pretzels G. Commercial Popcorn   L. Canned peaches    C. Carrot Sticks  H. Bouillon   M. Unsalted nuts   D. [place] fries  I. Peanut butter crackers N. Salami   E. Sweet pickles J. Tomato Juice   O. Pizza  12.  Seasonings that may be  used freely on a reduced - sodium diet include   A. Lemon wedges F.Monosodium glutamate K. Celery seed    B.Soysauce   G. Pepper   L. Mustard powder   C. Sea salt  H. Cooking wine  M. Onion flakes   D. Vinegar  E. Prepared horseradish N. Salsa   E. Sage   J. Worcestershire sauce  O. Chutney

## 2022-03-07 ENCOUNTER — Ambulatory Visit (INDEPENDENT_AMBULATORY_CARE_PROVIDER_SITE_OTHER): Payer: BC Managed Care – PPO

## 2022-03-07 DIAGNOSIS — Z952 Presence of prosthetic heart valve: Secondary | ICD-10-CM

## 2022-03-07 LAB — ECHOCARDIOGRAM COMPLETE
AR max vel: 0.75 cm2
AV Area VTI: 0.76 cm2
AV Area mean vel: 0.73 cm2
AV Mean grad: 32 mmHg
AV Peak grad: 51.8 mmHg
Ao pk vel: 3.6 m/s
Area-P 1/2: 2.33 cm2
S' Lateral: 3.1 cm

## 2023-03-07 NOTE — Progress Notes (Signed)
Cardiology Office Note:  .   Date:  03/21/2023  ID:  Douglas Hart, DOB [birth year], MRN [id number] PCP: Street, Stephanie Coup, MD  [facility] Providers Cardiologist:  Lesleigh Noe, MD (Inactive)    History of Present Illness: .   Douglas Hart is a 77 y.o. 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 (50-60% ostial Circumflex, 50-60% mid LAD stenosis).    He later developed a diffuse, pruritic maculopapular eruption felt to be related to plavix, which was discontinued. 1 month echo showed normally functioning TAVR with no AI but mean gradient more elevated from 18 mm hg on POD 1 echo to 31 mm Hg. Follow up cardiac CT showed no evidence of subclinical leaflet thrombosis. He was continued on aspirin alone.  I last saw the patient 02/23/22  and doing well. Updated echo showed mod AS.  He comes in for yearly f/u. Labs from PCP 03/10/23 LDL 124.has never agreed to a statin in the past.  He's overeating but trying to cut back. Has cut back and lost 25-30 lbs.he's taking chromium that has helped him lose weight.  Would like to come off his lasix. He drinks 4-6 cups of coffee daily. He's started skipping lasix every other day. Works in the yard but exercise limited due to need for knee replacement. Has been putting it off. Having a lot of trouble with constipation.Has tried a lot of things but worried something is wrong.   ROS:    Studies Reviewed: [name]    Prior CV Studies:   Echo 02/2022 IMPRESSIONS     1. Left ventricular ejection fraction, by estimation, is 60 to 65%. The  left ventricle has normal function. The left ventricle has no regional  wall motion abnormalities. There is mild left ventricular hypertrophy.  Left ventricular diastolic parameters   are consistent with Grade II diastolic dysfunction (pseudonormalization).   2. Right ventricular systolic function is normal. The right ventricular  size is normal. There is normal pulmonary artery systolic pressure.   3. Left atrial size was moderately dilated.   4. The mitral valve is normal in structure. No evidence of mitral valve  regurgitation. No evidence of mitral stenosis.   5. Moderate increase gradient accross AV prostesis. Unchanged as compared  to echo from 12/23/2020 ( peak/ mean gradient in 2022 45.9/32, now  51.8/32). Acceleration time . The aortic valve has been  repaired/replaced. Aortic valve regurgitation is not   visualized. Moderate aortic valve stenosis. There is a 26 mm Sapien  prosthetic (TAVR) valve present in the aortic position. Procedure Date:  12/23/2020.   6. There is borderline dilatation of the ascending aorta, measuring 38  mm.   7. The inferior vena cava is normal in size with greater than 50%  respiratory variability, suggesting right atrial pressure of 3 mmHg.    Echo 12/23/20 IMPRESSIONS  1. Left ventricular ejection fraction, by estimation, is 60 to 65%. The left ventricle has normal function. The left ventricle has no regional wall motion abnormalities. There is mild left ventricular hypertrophy. Left ventricular diastolic parameters  are consistent with Grade I diastolic dysfunction (impaired relaxation).  2. Right ventricular systolic function is normal. The right ventricular size is mildly enlarged. Tricuspid regurgitation signal is inadequate for assessing PA pressure.  3. Left atrial size was mildly dilated.  4. Right atrial size was mildly dilated.  5. The mitral valve is normal in structure. Trivial mitral valve regurgitation. No evidence of mitral stenosis.  6. Bioprosthetic aortic valve s/p TAVR with 25 mm Edwards Sapien THV. No significant peri-valvular leakage. The mean gradient across the valve was moderately elevated to 32 mmHg.  7.  Aortic dilatation noted. There is mild dilatation of the aortic root, measuring 38 mm.  8. The inferior vena cava is normal in size with greater than 50% respiratory variability, suggesting right atrial pressure of 3 mmHg.    Risk Assessment/Calculations:             Physical Exam:   VS:  BP 134/86   Pulse 68   Ht 6\' 1"  (1.854 m)   Wt 203 lb (92.1 kg)   SpO2 99%   BMI 26.78 kg/m    Wt Readings from Last 3 Encounters:  03/21/23 203 lb (92.1 kg)  02/23/22 243 lb (110.2 kg)  12/23/20 245 lb (111.1 kg)    GEN: Well nourished, well developed in no acute distress NECK: No JVD; No carotid bruits CARDIAC:  RRR, 2/6 systolic murmur LSB RESPIRATORY:  Clear to auscultation without rales, wheezing or rhonchi  ABDOMEN: Soft, non-tender, non-distended EXTREMITIES:  No edema;brawny changes, No deformity   ASSESSMENT AND PLAN: .   S/P TAVR 2021(bioprosthetic valve 2008)-SBE prophylaxis with amoxicillin. Echo stable 02/2022-mod AS. He is asymptomatic and wants to forgo echo update for another year.    Chronic diastolic CHF EF 60-65% on echo 12/2020-compensated today-has been taking lasix every other day and would like to see if he can stop. I think this is reasonable. He'll monitor his weight/edema/BP.    HTN BP stable- 2 gm sodium diet   HLD LDL 124 03/10/23. With CAD needs to be on a statin. Willing to try   CAD 50-60% Cfx and mid LAD on cath 11/2019-no angina-start statin.   OSA compliant on CPAP  Constipation-chronic problem but he thinks something else is going on. He'll contact his GI dr.        Monica Becton: F/U with Cardiologist in [place] in 6 months  Signed, Jacolyn Reedy, PA-C

## 2023-03-21 ENCOUNTER — Encounter: Payer: Self-pay | Admitting: Physician Assistant

## 2023-03-21 ENCOUNTER — Ambulatory Visit: Payer: Medicare PPO | Attending: Physician Assistant | Admitting: Physician Assistant

## 2023-03-21 VITALS — BP 134/86 | HR 68 | Ht 73.0 in | Wt 203.0 lb

## 2023-03-21 DIAGNOSIS — I5032 Chronic diastolic (congestive) heart failure: Secondary | ICD-10-CM

## 2023-03-21 DIAGNOSIS — E782 Mixed hyperlipidemia: Secondary | ICD-10-CM

## 2023-03-21 DIAGNOSIS — I2510 Atherosclerotic heart disease of native coronary artery without angina pectoris: Secondary | ICD-10-CM

## 2023-03-21 DIAGNOSIS — G4733 Obstructive sleep apnea (adult) (pediatric): Secondary | ICD-10-CM

## 2023-03-21 DIAGNOSIS — Z952 Presence of prosthetic heart valve: Secondary | ICD-10-CM | POA: Diagnosis not present

## 2023-03-21 DIAGNOSIS — I10 Essential (primary) hypertension: Secondary | ICD-10-CM

## 2023-03-21 DIAGNOSIS — K5900 Constipation, unspecified: Secondary | ICD-10-CM

## 2023-03-21 MED ORDER — FUROSEMIDE 40 MG PO TABS: 40.0000 mg | ORAL_TABLET | ORAL | 3 refills | Status: AC | PRN

## 2023-03-21 MED ORDER — ROSUVASTATIN CALCIUM 10 MG PO TABS: 10.0000 mg | ORAL_TABLET | Freq: Every day | ORAL | 3 refills | Status: DC

## 2023-03-21 NOTE — Patient Instructions (Signed)
Medication Instructions:  Your physician has recommended you make the following change in your medication:  DECREASE LASIX TO AS NEEDED FOR WEIGHT GAIN OF 3 LBS IN 1 DAY OR 5 LBS IN 1 WEEK START CRESTOR 10 MG DAILY *If you need a refill on your cardiac medications before your next appointment, please call your pharmacy*   Lab Work: NONE If you have labs (blood work) drawn today and your tests are completely normal, you will receive your results only by: MyChart Message (if you have MyChart) OR A paper copy in the mail If you have any lab test that is abnormal or we need to change your treatment, we will call you to review the results.   Testing/Procedures: NONE   Follow-Up: At [facility], you and your health needs are our priority.  As part of our continuing mission to provide you with exceptional heart care, we have created designated Provider Care Teams.  These Care Teams include your primary Cardiologist (physician) and Advanced Practice Providers (APPs -  Physician Assistants and Nurse Practitioners) who all work together to provide you with the care you need, when you need it.  We recommend signing up for the patient portal called "MyChart".  Sign up information is provided on this After Visit Summary.  MyChart is used to connect with patients for Virtual Visits (Telemedicine).  Patients are able to view lab/test results, encounter notes, upcoming appointments, etc.  Non-urgent messages can be sent to your provider as well.   To learn more about what you can do with MyChart, go to [email or web address].    Your next appointment:   6 month(s)  Provider:   Gypsy Balsam, MD, Norman Herrlich, MD, or Belva Crome, MD

## 2023-03-24 ENCOUNTER — Telehealth: Payer: Self-pay | Admitting: Physician Assistant

## 2023-03-24 NOTE — Telephone Encounter (Signed)
Pt c/o medication issue:  1. Name of Medication:  CRESTOR 10 MG  2. How are you currently taking this medication (dosage and times per day)? Daily  3. Are you having a reaction (difficulty breathing--STAT)? No  4. What is your medication issue? Pt stated he spoke with pharmacy and they told him they haven't received the prescription for this medication although we received the receipt confirmation on the same day as his office visit. Pt is requesting a callback to help get his medication. Please advise

## 2023-03-24 NOTE — Telephone Encounter (Signed)
Called [facility] pharmacy to confirm receipt of Rosuvastatin. Pharmacist states they have received the prescription and will have it ready for the patient.  Spoke with patient and informed him of the above. Patient verbalized understanding and expressed appreciation for call.

## 2023-06-17 DIAGNOSIS — N1330 Unspecified hydronephrosis: Secondary | ICD-10-CM | POA: Diagnosis not present

## 2023-06-17 DIAGNOSIS — Z20822 Contact with and (suspected) exposure to covid-19: Secondary | ICD-10-CM | POA: Diagnosis not present

## 2023-06-17 DIAGNOSIS — R1030 Lower abdominal pain, unspecified: Secondary | ICD-10-CM | POA: Diagnosis not present

## 2023-06-17 DIAGNOSIS — R590 Localized enlarged lymph nodes: Secondary | ICD-10-CM | POA: Diagnosis not present

## 2023-06-17 DIAGNOSIS — Z79899 Other long term (current) drug therapy: Secondary | ICD-10-CM | POA: Diagnosis not present

## 2023-06-17 DIAGNOSIS — R109 Unspecified abdominal pain: Secondary | ICD-10-CM | POA: Diagnosis not present

## 2023-06-17 DIAGNOSIS — R531 Weakness: Secondary | ICD-10-CM | POA: Diagnosis not present

## 2023-06-17 DIAGNOSIS — R339 Retention of urine, unspecified: Secondary | ICD-10-CM | POA: Diagnosis not present

## 2023-06-17 DIAGNOSIS — R140 Abdominal distension (gaseous): Secondary | ICD-10-CM | POA: Diagnosis not present

## 2023-06-19 ENCOUNTER — Ambulatory Visit: Payer: Medicare PPO | Admitting: Physician Assistant

## 2023-06-19 ENCOUNTER — Encounter: Payer: Self-pay | Admitting: Physician Assistant

## 2023-06-19 VITALS — BP 102/60 | HR 83 | Temp 97.3°F | Ht 68.0 in | Wt 205.8 lb

## 2023-06-19 DIAGNOSIS — I700 Atherosclerosis of aorta: Secondary | ICD-10-CM | POA: Diagnosis not present

## 2023-06-19 DIAGNOSIS — R319 Hematuria, unspecified: Secondary | ICD-10-CM

## 2023-06-19 DIAGNOSIS — N400 Enlarged prostate without lower urinary tract symptoms: Secondary | ICD-10-CM

## 2023-06-19 DIAGNOSIS — R339 Retention of urine, unspecified: Secondary | ICD-10-CM | POA: Diagnosis not present

## 2023-06-19 LAB — POCT URINALYSIS DIP (CLINITEK)
Bilirubin, UA: NEGATIVE
Glucose, UA: NEGATIVE mg/dL
Ketones, POC UA: NEGATIVE mg/dL
Nitrite, UA: NEGATIVE
POC PROTEIN,UA: >=300 — AB
Spec Grav, UA: >=1.03 — AB (ref 1.010–1.025)
Urobilinogen, UA: 0.2 U/dL
pH, UA: 5.5 (ref 5.0–8.0)

## 2023-06-19 MED ORDER — TAMSULOSIN HCL 0.4 MG PO CAPS
0.4000 mg | ORAL_CAPSULE | Freq: Every day | ORAL | 0 refills | Status: AC
Start: 2023-06-19 — End: ?

## 2023-06-19 NOTE — Progress Notes (Unsigned)
Subjective:  Patient ID: Douglas Hart, male    DOB: 1946/06/30  Age: 77 y.o. MRN: [id number]  Chief Complaint  Patient presents with   New Patient (Initial Visit)   Abdominal Pain   Follow-up    Patient is establishing as a new patient.  HPI  Patient was seen at ED on 06/17/2023 for Urinary retention at [facility]. He presented with complaints of lower abdominal pain without radiation and difficulty with urination but no severe dysuria. They added Tamsulosin and recommended to see urologist.  CT abdomen and pelvis showed constellation of findings compatible with obstructive uropathy secondary to enlarged prostate with or wo cystitis and ascending urinary tract infection.  Aortic atherosclerosis  Patient stated he did not know who Dr.Chao was or if he was supposed to see him. Thought our office was a urologist office. Clarified with the patient that we are a family medicine clinic and are more than happy to expedite the referral to Dr.Chao and that he was the Urologist the hospital wanted him to see. He also had not started the Flomax and was concerned because he was not able to get the medicine due to his pharmacy being closed on Sunday and the ER not sending his medicine to a different pharmacy after going up to the ER to try and get a written order. Re prescribed the Flomax and sent an Urgent Referral to Dr. Chao.      10 /03/2023   10:22 AM  Depression screen PHQ 2/9  Decreased Interest 0  Down, Depressed, Hopeless 0  PHQ - 2 Score 0         12/17/2019    7:45 AM 12/17/2019    4:43 PM 12/17/2019   11:00 PM 12/18/2019    7:45 AM 06/19/2023   10:22 AM  Fall Risk  Falls in the past year?     0  Was there an injury with Fall?     0  Fall Risk Category Calculator     0  (RETIRED) Patient Fall Risk Level Moderate fall risk Moderate fall risk Moderate fall risk Moderate fall risk   Patient at Risk for Falls Due to     No Fall Risks  Fall risk Follow up     Falls evaluation  completed     Current Outpatient Medications on File Prior to Visit  Medication Sig Dispense Refill   amLODipine (NORVASC) 5 MG tablet Take 1 tablet (5 mg total) by mouth daily. 90 tablet 3   Ascorbic Acid (VITAMIN C) 1000 MG tablet Take 1,000 mg by mouth daily.     furosemide (LASIX) 40 MG tablet Take 1 tablet (40 mg total) by mouth as needed (FOR WEIGHT GAIN OF 3 LBS IN 1 DAY OR 5 LBS IN 1 WEEK). 90 tablet 3   lisinopril (ZESTRIL) 40 MG tablet Take 1 tablet (40 mg total) by mouth daily. 90 tablet 3   metoprolol succinate (TOPROL-XL) 50 MG 24 hr tablet TAKE 1 TABLET BY MOUTH ONCE DAILY OR IMMEDIATELY FOLLOWING A MEAL 90 tablet 2   Multiple Vitamin (MULTIVITAMIN WITH MINERALS) TABS tablet Take 1 tablet by mouth daily.     potassium chloride SA (K-DUR,KLOR-CON) 20 MEQ tablet Take 1 tablet (20 mEq total) by mouth daily. 90 tablet 3   amoxicillin (AMOXIL) 500 MG capsule Take 2,000 mg by mouth See admin instructions. Take 2000 mg by mouth 1 hour prior to dental appointments (Patient not taking: Reported on 06/19/2023)     No current  facility-administered medications on file prior to visit.  . Social History   Socioeconomic History   Marital status: Married    Spouse name: Not on file   Number of children: Not on file   Years of education: Not on file   Highest education level: Not on file  Occupational History   Not on file  Tobacco Use   Smoking status: Never   Smokeless tobacco: Never  Vaping Use   Vaping status: Never Used  Substance and Sexual Activity   Alcohol use: No   Drug use: No   Sexual activity: Not on file  Other Topics Concern   Not on file  Social History Narrative   Not on file   Social Determinants of Health   Financial Resource Strain: Low Risk  (06/19/2023)   Overall Financial Resource Strain (CARDIA)    Difficulty of Paying Living Expenses: Not hard at all  Food Insecurity: No Food Insecurity (06/19/2023)   Hunger Vital Sign    Worried About Running Out of  Food in the Last Year: Never true    Ran Out of Food in the Last Year: Never true  Transportation Needs: No Transportation Needs (06/19/2023)   PRAPARE - Administrator, Civil Service (Medical): No    Lack of Transportation (Non-Medical): No  Physical Activity: Not on file  Stress: Not on file  Social Connections: Moderately Isolated (06/19/2023)   Social Connection and Isolation Panel [NHANES]    Frequency of Communication with Friends and Family: More than three times a week    Frequency of Social Gatherings with Friends and Family: Three times a week    Attends Religious Services: Never    Active Member of Clubs or Organizations: No    Attends Banker Meetings: Never    Marital Status: Married   Past Medical History:  Diagnosis Date   Asthma    Bicuspid aortic valve    Chronic diastolic (congestive) heart failure (HCC)    Hearing loss    Hypertension    Knee pain    Prosthetic valve dysfunction    S/P AVR (aortic valve replacement)    s/p AVR with a 25mm Edwards Magna Valve serial #[id number] in 2008 by Dr. 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  HENT:  Negative for congestion, ear pain and sore throat.   Respiratory:  Negative for cough and shortness of breath.   Cardiovascular:  Negative for chest pain.  Gastrointestinal:  Negative for abdominal pain,  constipation, diarrhea, nausea and vomiting.  Genitourinary:  Negative for dysuria, frequency and urgency.  Musculoskeletal:  Negative for arthralgias, back pain and myalgias.  Neurological:  Negative for dizziness and headaches.  Psychiatric/Behavioral:  Negative for agitation and sleep disturbance. The patient is not nervous/anxious.      Objective:  BP 102/60 (BP Location: Left Arm, Patient Position: Sitting, Cuff Size: Large)   Pulse 83   Temp (!) 97.3 F (36.3 C) (Temporal)   Ht 5\' 8"  (1.727 m)   Wt 205 lb 12.8 oz (93.4 kg)   SpO2 97%   BMI 31.29 kg/m      06/19/2023   10:15 AM 03/21/2023    9:56 AM 02/23/2022   12:46 PM  BP/Weight  Systolic BP 102 134 142  Diastolic BP 60 86 84  Wt. (Lbs) 205.8 203 243  BMI 31.29 kg/m2 26.78 kg/m2 32.06 kg/m2    Physical Exam Constitutional:      Appearance: Normal appearance.  HENT:     Right Ear: Tympanic membrane normal.     Left Ear: Tympanic membrane normal.     Nose: Nose normal.     Mouth/Throat:     Pharynx: No oropharyngeal exudate or posterior oropharyngeal erythema.  Eyes:     Conjunctiva/sclera: Conjunctivae normal.  Neck:     Vascular: No carotid bruit.  Cardiovascular:     Rate and Rhythm: Normal rate and regular rhythm.     Heart sounds: Normal heart sounds.  Pulmonary:     Effort: Pulmonary effort is normal.     Breath sounds: Normal breath sounds.  Abdominal:     General: Bowel sounds are normal.     Palpations: Abdomen is soft.     Tenderness: There is abdominal tenderness in the suprapubic area.     Comments: Mild ache in the suprapubic area.   Skin:    Findings: No lesion or rash.  Neurological:     Mental Status: He is alert and oriented to person, place, and time.  Psychiatric:        Behavior: Behavior normal.    Diabetic Foot Exam - Simple   No data filed      Lab Results  Component Value Date   WBC 5.2 12/18/2019   HGB 11.1 (L) 12/18/2019   HCT 33.7 (L) 12/18/2019   PLT 131 (L)  12/18/2019   GLUCOSE 88 12/23/2020   CHOL 165 12/13/2019   TRIG 98 12/13/2019   HDL 40 (L) 12/13/2019   LDLCALC 105 (H) 12/13/2019   ALT 48 (H) 12/17/2019   AST 28 12/17/2019   NA 142 12/23/2020   K 3.7 12/23/2020   CL 103 12/23/2020   CREATININE 1.17 12/23/2020   BUN 16 12/23/2020   CO2 23 12/23/2020   INR 1.1 12/16/2019   HGBA1C 5.2 12/17/2019      Assessment & Plan:  Aortic atherosclerosis (HCC) Found on imaging at the hospital Continue working on diet and exercise Will consider starting statin therapy   Enlarged prostate Referral sent to Dr.Chao  Hematuria UA performed in office Sent for culture due to patient stating he was having discharge out his penis around the catheter.  Urinary retention Drained 3000cc in the hospital Continues to have micro hematuria along with minor suprapubic pain    Meds ordered this encounter  Medications   tamsulosin (FLOMAX) 0.4 MG CAPS capsule    Sig: Take 1 capsule (0.4 mg total) by mouth daily.    Dispense:  30 capsule    Refill:  0   Orders Placed This Encounter  Procedures   Urine Culture  Microalbumin / creatinine urine ratio   Ambulatory referral to Urology   POCT URINALYSIS DIP (CLINITEK)      Follow-up: No follow-ups on file.  AVS was given to patient prior to departure.  [facility] [phone]

## 2023-06-20 ENCOUNTER — Encounter: Payer: Self-pay | Admitting: Physician Assistant

## 2023-06-20 DIAGNOSIS — R319 Hematuria, unspecified: Secondary | ICD-10-CM | POA: Insufficient documentation

## 2023-06-20 DIAGNOSIS — I700 Atherosclerosis of aorta: Secondary | ICD-10-CM | POA: Insufficient documentation

## 2023-06-20 DIAGNOSIS — N400 Enlarged prostate without lower urinary tract symptoms: Secondary | ICD-10-CM | POA: Insufficient documentation

## 2023-06-20 DIAGNOSIS — R339 Retention of urine, unspecified: Secondary | ICD-10-CM | POA: Insufficient documentation

## 2023-06-20 HISTORY — DX: Retention of urine, unspecified: R33.9

## 2023-06-20 HISTORY — DX: Benign prostatic hyperplasia without lower urinary tract symptoms: N40.0

## 2023-06-20 HISTORY — DX: Atherosclerosis of aorta: I70.0

## 2023-06-20 HISTORY — DX: Hematuria, unspecified: R31.9

## 2023-06-20 LAB — MICROALBUMIN / CREATININE URINE RATIO
Creatinine, Urine: 161 mg/dL
Microalb/Creat Ratio: 1322 mg/g{creat} — ABNORMAL HIGH (ref 0–29)
Microalbumin, Urine: 2127.7 ug/mL

## 2023-06-20 LAB — URINE CULTURE: Organism ID, Bacteria: NO GROWTH

## 2023-06-20 NOTE — Assessment & Plan Note (Addendum)
Drained 3000cc in the hospital Continues to have micro hematuria along with minor suprapubic pain

## 2023-06-20 NOTE — Assessment & Plan Note (Signed)
UA performed in office Sent for culture due to patient stating he was having discharge out his penis around the catheter.

## 2023-06-20 NOTE — Assessment & Plan Note (Signed)
Referral sent to Dr Saddie Benders

## 2023-06-20 NOTE — Assessment & Plan Note (Signed)
Found on imaging at the hospital Continue working on diet and exercise Will consider starting statin therapy

## 2023-06-26 DIAGNOSIS — N401 Enlarged prostate with lower urinary tract symptoms: Secondary | ICD-10-CM | POA: Diagnosis not present

## 2023-06-26 DIAGNOSIS — Z79899 Other long term (current) drug therapy: Secondary | ICD-10-CM | POA: Diagnosis not present

## 2023-07-10 DIAGNOSIS — R339 Retention of urine, unspecified: Secondary | ICD-10-CM | POA: Diagnosis not present

## 2023-07-10 DIAGNOSIS — N401 Enlarged prostate with lower urinary tract symptoms: Secondary | ICD-10-CM | POA: Diagnosis not present

## 2023-07-10 DIAGNOSIS — N390 Urinary tract infection, site not specified: Secondary | ICD-10-CM | POA: Diagnosis not present

## 2023-08-02 DIAGNOSIS — N401 Enlarged prostate with lower urinary tract symptoms: Secondary | ICD-10-CM | POA: Diagnosis not present

## 2023-08-02 DIAGNOSIS — R339 Retention of urine, unspecified: Secondary | ICD-10-CM | POA: Diagnosis not present

## 2023-08-09 DIAGNOSIS — R338 Other retention of urine: Secondary | ICD-10-CM | POA: Diagnosis not present

## 2023-08-21 ENCOUNTER — Ambulatory Visit: Payer: Medicare PPO

## 2023-08-23 DIAGNOSIS — R338 Other retention of urine: Secondary | ICD-10-CM | POA: Diagnosis not present

## 2023-12-07 DIAGNOSIS — R972 Elevated prostate specific antigen [PSA]: Secondary | ICD-10-CM | POA: Diagnosis not present

## 2023-12-07 DIAGNOSIS — N401 Enlarged prostate with lower urinary tract symptoms: Secondary | ICD-10-CM | POA: Diagnosis not present

## 2023-12-07 DIAGNOSIS — K5909 Other constipation: Secondary | ICD-10-CM | POA: Diagnosis not present

## 2023-12-07 DIAGNOSIS — N400 Enlarged prostate without lower urinary tract symptoms: Secondary | ICD-10-CM | POA: Diagnosis not present

## 2023-12-07 DIAGNOSIS — R339 Retention of urine, unspecified: Secondary | ICD-10-CM | POA: Diagnosis not present

## 2024-03-29 DIAGNOSIS — E785 Hyperlipidemia, unspecified: Secondary | ICD-10-CM | POA: Diagnosis not present

## 2024-03-29 DIAGNOSIS — Z131 Encounter for screening for diabetes mellitus: Secondary | ICD-10-CM | POA: Diagnosis not present

## 2024-03-29 DIAGNOSIS — R972 Elevated prostate specific antigen [PSA]: Secondary | ICD-10-CM | POA: Diagnosis not present

## 2024-04-11 DIAGNOSIS — G720 Drug-induced myopathy: Secondary | ICD-10-CM | POA: Diagnosis not present

## 2024-04-11 DIAGNOSIS — T466X5A Adverse effect of antihyperlipidemic and antiarteriosclerotic drugs, initial encounter: Secondary | ICD-10-CM | POA: Diagnosis not present

## 2024-04-11 DIAGNOSIS — E785 Hyperlipidemia, unspecified: Secondary | ICD-10-CM | POA: Diagnosis not present

## 2024-04-11 DIAGNOSIS — I110 Hypertensive heart disease with heart failure: Secondary | ICD-10-CM | POA: Diagnosis not present

## 2024-04-11 DIAGNOSIS — Z23 Encounter for immunization: Secondary | ICD-10-CM | POA: Diagnosis not present

## 2024-04-11 DIAGNOSIS — Z0000 Encounter for general adult medical examination without abnormal findings: Secondary | ICD-10-CM | POA: Diagnosis not present

## 2024-04-11 DIAGNOSIS — I5189 Other ill-defined heart diseases: Secondary | ICD-10-CM | POA: Diagnosis not present

## 2024-04-11 DIAGNOSIS — S61511A Laceration without foreign body of right wrist, initial encounter: Secondary | ICD-10-CM | POA: Diagnosis not present

## 2024-04-11 DIAGNOSIS — I5030 Unspecified diastolic (congestive) heart failure: Secondary | ICD-10-CM | POA: Diagnosis not present

## 2024-04-15 NOTE — Progress Notes (Signed)
 Cardiology Office Note:    Date:  04/19/2024   ID:  Douglas Hart, DOB 07/23/1946, MRN [id number]  PCP:  Street, Lonni HERO, MD  Cardiologist:  Redell Leiter, MD    Referring MD: Street, Lonni HERO, *    ASSESSMENT:    1. S/P TAVR (transcatheter aortic valve replacement)   2. Coronary artery disease involving native coronary artery of native heart without angina pectoris   3. Hypertensive heart disease with heart failure (HCC)   4. Atrial flutter, unspecified type (HCC)   5. Mixed hyperlipidemia   6. 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  He will need a follow-up echocardiogram to survey his TAVR arranged in my office Stable CAD having no anginal discomfort on good medical therapy including aspirin  antihypertensive BP at target and indeed he is taking a low-dose statin 2 days a week with his LDL at target we will note this in the chart and we will continue the prescription Heart failure is compensated is on a loop diuretic on a as needed basis Continue his low-dose intermittent statin Stable sleep apnea   Next appointment: He will see me in yearly follow-up   Medication Adjustments/Labs and Tests Ordered: Current medicines are reviewed at length with the patient today.  Concerns regarding medicines are outlined above.  Orders Placed This Encounter  Procedures   EKG 12-Lead   ECHOCARDIOGRAM COMPLETE   No orders of the defined types were placed in this encounter.    History of Present Illness:    Douglas Hart is a 78 y.o. male with a hx of aortic stenosis with initial bioprosthetic AVR in 2008 and subsequent TAVR 2021 moderate nonobstructive CAD previous postoperative atrial flutter hypertension hyperlipidemia sleep apnea on CPAP chronic diastolic heart  failure and asthma last seen by structural heart 03/21/2023 with follow-up for TAVR 12/17/2019.  His last echocardiogram June 2023 showed increased gradient across the valve and a prolonged acceleration time unchanged from the previous 2022.  He previously seen by Dr. Claudene who is retired last 2021.  Compliance with diet, lifestyle and medications: Yes  Domenic continues to do well from a cardiology perspective he is vigorous active has no angina edema shortness of breath chest pain palpitation or syncope.  Although not listed on his medications he has been taking his statin rosuvastatin  10 mg 2 days a week and his lipid profile is ideal.  Recent labs show normal CBC A1c 5.6 CMP is entirely normal renal function liver function GFR greater than 60 cc cholesterol 175 LDL 89 T4 normal Past Medical History:  Diagnosis Date   Abnormal prostate specific antigen 06/13/2019   Acute diastolic congestive heart failure (HCC)    Aortic atherosclerosis (HCC) 06/20/2023   Aortic prosthetic valve regurgitation    Asthma    Atrial flutter (HCC) 04/22/2015   IMO SNOMED Dx Update Oct 2024     Bicuspid aortic valve    Chronic diastolic (congestive) heart failure (HCC)    Chronic pain of left knee 01/08/2019   Enlarged prostate 06/20/2023   Essential hypertension 04/22/2015   H/O aortic valve replacement 04/22/2015   Hearing loss    Hematuria 06/20/2023   Hyperlipidemia 04/22/2015   Hypertension    Increasing residual urine 06/13/2019  Knee pain    Nocturia 06/13/2019   OSA on CPAP 12/16/2019   S/P TAVR (transcatheter aortic valve replacement) 12/17/2019   s/p TAVR with a 26 mm Edwards Sapien Ultra THV via the TF approach by Drs Verlin and Lucas   Urinary retention 06/20/2023    Current Medications: Current Meds  Medication Sig   amLODipine  (NORVASC ) 5 MG tablet Take 1 tablet (5 mg total) by mouth daily.   amoxicillin (AMOXIL) 500 MG capsule Take 2,000 mg by mouth See admin instructions. Take 2000  mg by mouth 1 hour prior to dental appointments   Ascorbic Acid (VITAMIN C) 1000 MG tablet Take 1,000 mg by mouth daily.   furosemide  (LASIX ) 40 MG tablet Take 1 tablet (40 mg total) by mouth as needed (FOR WEIGHT GAIN OF 3 LBS IN 1 DAY OR 5 LBS IN 1 WEEK).   lisinopril  (ZESTRIL ) 40 MG tablet Take 1 tablet (40 mg total) by mouth daily.   metoprolol  succinate (TOPROL -XL) 50 MG 24 hr tablet TAKE 1 TABLET BY MOUTH ONCE DAILY OR IMMEDIATELY FOLLOWING A MEAL   Multiple Vitamin (MULTIVITAMIN WITH MINERALS) TABS tablet Take 1 tablet by mouth daily.   potassium chloride  SA (K-DUR,KLOR-CON ) 20 MEQ tablet Take 1 tablet (20 mEq total) by mouth daily.   tamsulosin  (FLOMAX ) 0.4 MG CAPS capsule Take 1 capsule (0.4 mg total) by mouth daily.      EKGs/Labs/Other Studies Reviewed:    The following studies were reviewed today:  Cardiac Studies & Procedures   ______________________________________________________________________________________________ CARDIAC CATHETERIZATION  CARDIAC CATHETERIZATION 12/11/2019  Conclusion  Severe pulmonary hypertension secondary to acute diastolic heart failure secondary to acute aortic regurgitation.  Widely patent left main, RCA, with 50 to 60% ostial circumflex and 50 to 60% mid LAD stenoses.  Severe bioprosthetic valve regurgitation.  RECOMMENDATIONS:   We will speak with Dr. Army concerning management of this patient's aortic bioprosthetic dysfunction with severe aortic regurgitation.  Currently out of heart failure and feeling back to baseline.  Findings Coronary Findings Diagnostic  Dominance: Right  Left Anterior Descending Mid LAD lesion is 50% stenosed.  First Diagonal Branch Vessel is large in size.  Second Diagonal Branch Vessel is small in size.  Third Diagonal Branch Vessel is small in size.  Left Circumflex Ost Cx lesion is 50% stenosed.  Intervention  No interventions have been documented.   CARDIAC  CATHETERIZATION  CARDIAC CATHETERIZATION 10/13/2006   STRESS TESTS  MYOCARDIAL PERFUSION IMAGING 01/02/2014   ECHOCARDIOGRAM  ECHOCARDIOGRAM COMPLETE 03/07/2022  Narrative ECHOCARDIOGRAM REPORT    Patient Name:   Douglas Hart Date of Exam: 03/07/2022 Medical Rec #:  [id number]     Height:       73.0 in Accession #:    [id number]    Weight:       243.0 lb Date of Birth:  [birth year]     BSA:          2.337 m Patient Age:    76 years      BP:           142/84 mmHg Patient Gender: M             HR:           62 bpm. Exam Location:  [facility]  Procedure: 2D Echo, Cardiac Doppler, Color Doppler and Strain Analysis  Indications:    S/P TAVR (transcatheter aortic valve replacement) [Z95.2 (ICD-10-CM)]  History:        Patient has prior history of Echocardiogram examinations,  most recent 12/23/2020. Bicuspid aortic valve, s/p TAVR with a 26 mm Edwards Sapien Ultra THV. Aortic Valve: 26 mm Sapien prosthetic, stented (TAVR) valve is present in the aortic position. Procedure Date: 12/23/2020.  Sonographer:    Lynwood Silvas RDCS Referring Phys: [id number] MICHELE M LENZE  IMPRESSIONS   1. Left ventricular ejection fraction, by estimation, is 60 to 65%. The left ventricle has normal function. The left ventricle has no regional wall motion abnormalities. There is mild left ventricular hypertrophy. Left ventricular diastolic parameters are consistent with Grade II diastolic dysfunction (pseudonormalization). 2. Right ventricular systolic function is normal. The right ventricular size is normal. There is normal pulmonary artery systolic pressure. 3. Left atrial size was moderately dilated. 4. The mitral valve is normal in structure. No evidence of mitral valve regurgitation. No evidence of mitral stenosis. 5. Moderate increase gradient accross AV prostesis. Unchanged as compared to echo from 12/23/2020 ( peak/ mean gradient in 2022 45.9/32, now 51.8/32). Acceleration time . The aortic valve has  been repaired/replaced. Aortic valve regurgitation is not visualized. Moderate aortic valve stenosis. There is a 26 mm Sapien prosthetic (TAVR) valve present in the aortic position. Procedure Date: 12/23/2020. 6. There is borderline dilatation of the ascending aorta, measuring 38 mm. 7. The inferior vena cava is normal in size with greater than 50% respiratory variability, suggesting right atrial pressure of 3 mmHg.  FINDINGS Left Ventricle: Left ventricular ejection fraction, by estimation, is 60 to 65%. The left ventricle has normal function. The left ventricle has no regional wall motion abnormalities. The left ventricular internal cavity size was normal in size. There is mild left ventricular hypertrophy. Left ventricular diastolic parameters are consistent with Grade II diastolic dysfunction (pseudonormalization).  Right Ventricle: The right ventricular size is normal. No increase in right ventricular wall thickness. Right ventricular systolic function is normal. There is normal pulmonary artery systolic pressure. The tricuspid regurgitant velocity is 1.86 m/s, and with an assumed right atrial pressure of 3 mmHg, the estimated right ventricular systolic pressure is 16.8 mmHg.  Left Atrium: Left atrial size was moderately dilated.  Right Atrium: Right atrial size was normal in size.  Pericardium: There is no evidence of pericardial effusion.  Mitral Valve: The mitral valve is normal in structure. No evidence of mitral valve regurgitation. No evidence of mitral valve stenosis.  Tricuspid Valve: The tricuspid valve is normal in structure. Tricuspid valve regurgitation is trivial. No evidence of tricuspid stenosis.  Aortic Valve: Moderate increase gradient accross AV prostesis. Unchanged as compared to echo from 12/23/2020 ( peak/ mean gradient in 2022 45.9/32, now 51.8/32). Acceleration time . The aortic valve has been repaired/replaced. Aortic valve regurgitation is not visualized.  Moderate aortic stenosis is present. Aortic valve mean gradient measures 32.0 mmHg. Aortic valve peak gradient measures 51.8 mmHg. Aortic valve area, by VTI measures 0.76 cm. There is a 26 mm Sapien prosthetic, stented (TAVR) valve present in the aortic position. Procedure Date: 12/23/2020.  Pulmonic Valve: The pulmonic valve was normal in structure. Pulmonic valve regurgitation is not visualized. No evidence of pulmonic stenosis.  Aorta: The aortic root is normal in size and structure. There is borderline dilatation of the ascending aorta, measuring 38 mm.  Venous: The inferior vena cava is normal in size with greater than 50% respiratory variability, suggesting right atrial pressure of 3 mmHg.  IAS/Shunts: No atrial level shunt detected by color flow Doppler.   LEFT VENTRICLE PLAX 2D LVIDd:         4.60 cm  Diastology LVIDs:         3.10 cm   LV e' medial:    4.79 cm/s LV PW:         1.60 cm   LV E/e' medial:  19.5 LV IVS:        1.60 cm   LV e' lateral:   4.24 cm/s LVOT diam:     2.00 cm   LV E/e' lateral: 22.0 LV SV:         68 LV SV Index:   29 LVOT Area:     3.14 cm   RIGHT VENTRICLE            IVC RV S prime:     8.38 cm/s  IVC diam: 1.90 cm TAPSE (M-mode): 2.1 cm  LEFT ATRIUM              Index        RIGHT ATRIUM           Index LA diam:        4.90 cm  2.10 cm/m   RA Area:     19.70 cm LA Vol (A2C):   112.0 ml 47.92 ml/m  RA Volume:   49.00 ml  20.96 ml/m LA Vol (A4C):   89.8 ml  38.42 ml/m LA Biplane Vol: 102.0 ml 43.64 ml/m AORTIC VALVE AV Area (Vmax):    0.75 cm AV Area (Vmean):   0.73 cm AV Area (VTI):     0.76 cm AV Vmax:           360.00 cm/s AV Vmean:          271.000 cm/s AV VTI:            0.895 m AV Peak Grad:      51.8 mmHg AV Mean Grad:      32.0 mmHg LVOT Vmax:         86.20 cm/s LVOT Vmean:        63.100 cm/s LVOT VTI:          0.217 m LVOT/AV VTI ratio: 0.24  AORTA Ao Root diam: 2.90 cm Ao Asc diam:  3.80 cm Ao Desc diam: 2.00  cm  MITRAL VALVE               TRICUSPID VALVE MV Area (PHT): 2.33 cm    TR Peak grad:   13.8 mmHg MV Decel Time: 325 msec    TR Vmax:        186.00 cm/s MV E velocity: 93.40 cm/s MV A velocity: 96.40 cm/s  SHUNTS MV E/A ratio:  0.97        Systemic VTI:  0.22 m Systemic Diam: 2.00 cm  Lamar Fitch MD Electronically signed by Lamar Fitch MD Signature Date/Time: 03/07/2022/12:22:46 PM    Final      CT SCANS  CT CORONARY MORPH W/CTA COR W/SCORE 01/22/2020  Addendum 01/22/2020  1:35 PM ADDENDUM REPORT: 01/22/2020 13:33  EXAM: OVER-READ INTERPRETATION  CT CHEST  The following report is an over-read performed by radiologist Dr. Toribio [facility]ove [facility] on 01/22/2020. This over-read does not include interpretation of cardiac or coronary anatomy or pathology. The coronary calcium  score and cardiac CTA interpretation by the cardiologist is attached.  COMPARISON:  Cardiac CTA 12/13/2019.  FINDINGS: Aortic atherosclerosis. Within the visualized portions of the thorax there are no suspicious appearing pulmonary nodules or masses, there is no acute consolidative airspace disease, no pleural effusions, no pneumothorax and no lymphadenopathy. Visualized  portions of the upper abdomen are unremarkable. There are no aggressive appearing lytic or blastic lesions noted in the visualized portions of the skeleton. Status post median sternotomy.  IMPRESSION: 1.  Aortic Atherosclerosis (ICD10-I70.0).   Electronically Signed By: Toribio Aye M.D. On: 01/22/2020 13:33  Narrative CLINICAL DATA:  Post TAVR with elevated Gradients  EXAM: Cardiac TAVR CT  TECHNIQUE: The patient was scanned on a Siemens Force 192 slice scanner. A 120 kV retrospective scan was triggered in the ascending thoracic aorta at 140 HU's. Gantry rotation speed was 250 msecs and collimation was .6 mm. No beta blockade or nitro were given. The 3D data set was reconstructed in 5%  intervals of the R-R cycle. Systolic and diastolic phases were analyzed on a dedicated work station using MPR, MIP and VRT modes. The patient received 80 cc of contrast.  FINDINGS: Aortic Valve: Patient has had an ARV with 25 mm [employer] 3000 pericardial tissue valve. Subsequently is now post valve in valve TAVR with 26 mm Sapien 3 Ultra valve. The valve is well expanded and well positioned. The strut to strut diameter in SA view is 26 mm. The leaflets apear thin and open well There is no HAM/HALT or evidence of sub acute thrombosis  Aorta: Normal arch vessels tortuous descending thoracic aorta with mild calcific aortic debris  Sino-tubular Junction: 34 mm  Ascending Thoracic Aorta: 41 mm  Descending Thoracic Aorta: 29 mm  Sinus of Valsalva Measurements:  Non-coronary: 36 mm  Right - coronary: 32 mm  Left -   coronary: 36 mm  Coronary Artery: LM and RCA originate below superior margin of the stent but have normal flow. On SA view the LM is 6 mm from struts and RCA only 2 mm from struts  There is no LAA thrombus  Normal PV anatomy  No pericardial effusion  No ASD/PFO  IMPRESSION: 1. Well positioned and expanded 26 mm Sapien 3 valve in valve TAVR with no HAM/HALT or evidence of sub acute thrombosis  2.  Mild aortic root dilatation 4.1 cm  Maude Emmer  Electronically Signed: By: Maude Emmer M.D. On: 01/22/2020 11:31   CT SCANS  CT CORONARY MORPH W/CTA COR W/SCORE 12/13/2019  Addendum 12/16/2019  8:27 AM ADDENDUM REPORT: 12/16/2019 08:24  EXAM: OVER-READ INTERPRETATION  CT CHEST  The following report is an over-read performed by radiologist Dr. Selinda Saas [facility] on 12/16/2019. This over-read does not include interpretation of cardiac or coronary anatomy or pathology. The coronary CTA interpretation by the cardiologist is attached.  COMPARISON:  11/14/2019 chest CT angiogram.  FINDINGS: Please see the separate  concurrent chest CT angiogram report for details.  IMPRESSION: Please see the separate concurrent chest CT angiogram report for details.   Electronically Signed By: Selinda DELENA Blue M.D. On: 12/16/2019 08:24  Narrative CLINICAL DATA:  Bioprosthetic Valve Regurgitation/TAVR evaluation.  EXAM: Cardiac TAVR CT  TECHNIQUE: The patient was scanned on a [employer]. A 120 kV retrospective scan was triggered in the descending thoracic aorta at 111 HU's. Gantry rotation speed was 250 msecs and collimation was .6 mm. No beta blockade or nitro were given. The 3D data set was reconstructed in 5% intervals of the R-R cycle. Systolic and diastolic phases were analyzed on a dedicated work station using MPR, MIP and VRT modes. The patient received 80 cc of contrast.  FINDINGS: Image quality: Excellent.  Noise artifact is: Limited.  Aortic Valve Prosthesis: The aortic valve has been replaced with  a 25 mm [employer] 3000 pericardial tissue valve which was performed on 10/17/2006 per the medical record. 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 The leaflets of the prosthetic valve are thickened and there is severe prolapse of the leaflet of the LCC consistent with severe prosthetic valve regurgitation. There is no evidence of vegetation or aortic root abscess. There is no apparent paravalvular leak.  Virtual Transcatheter Heart Valve (THV) to Coronary Distance (VTC): Modeling was performed using a 26 mm Edwards Sapien 3 valve as recommended per the ViV application.  RCA VTC: 4.1 mm. The RCA originates at the tip of the post of the virtual THV.  LCA VTC: 6.7 mm. The LCA originates below the tip of the post of the virtual THV.  Subannular calcification: No significant sub-annular calcification below the sewing ring.  Optimal coplanar projection: LAO 15 CRA 6  Sinus of Valsalva Measurements: Asymmetric due to native bicuspid valve  diease.  Non-coronary: 36 mm  Right-coronary: 32 mm  Left-coronary: 36 mm  Sinus of Valsalva Height:  Non-coronary: 18 mm  Right-coronary: 21 mm  Left-coronary: 18.7 mm  Sinotubular Junction: 34 mm.  Minimal calcified plaque.  Ascending Thoracic Aorta: The ascending aorta measures up to 41 mm. No significant plaque.  Coronary Arteries: Normal coronary origin. Right dominance. The study was performed without use of NTG and is insufficient for plaque evaluation.  Cardiac Morphology:  Right Atrium: Right atrial size is within normal limits.  Right Ventricle: The right ventricular cavity is within normal limits.  Left Atrium: Left atrial size is normal in size with no left atrial appendage filling defect.  Left Ventricle: The ventricular cavity size is within normal limits. There are no stigmata of prior infarction. There is no abnormal filling defect. LVEF=63%. No regional wall motion abnormalities.  Pulmonary arteries: Normal in size without proximal filling defect.  Pulmonary veins: Normal pulmonary venous drainage.  Pericardium: Normal thickness with no significant effusion or calcium  present.  Mitral Valve: The mitral valve is normal structure without significant calcification.  Extra-cardiac findings: See attached radiology report for non-cardiac structures.  IMPRESSION: 1. 25 mm Edwards [employer] 3000 pericardial bioprosthetic valve is present in the aortic position with severe prolapse of the LCC leaflet suggestive of severe prosthetic valve regurgitation.  2. 26 mm Edwards Sapien 3 recommended per ViV application.  3. RCA VTC is borderline (4.1 mm) but the RCA originates at the tip of the post of the virtual THV.  4. LCA VTC is sufficient (6.7 mm).  5. Optimal Fluoroscopic Angle for Delivery: LAO 15 CRA 6.  <CurrentUser>, MD  Electronically Signed: By: Darryle Decent On: 12/15/2019 15:08      ______________________________________________________________________________________________          Recent Labs: No results found for requested labs within last 365 days.  Recent Lipid Panel    Component Value Date/Time   CHOL 165 12/13/2019 0217   TRIG 98 12/13/2019 0217   HDL 40 (L) 12/13/2019 0217   CHOLHDL 4.1 12/13/2019 0217   VLDL 20 12/13/2019 0217   LDLCALC 105 (H) 12/13/2019 0217    Physical Exam:    VS:  BP 136/76   Pulse 70   Ht 6' 1 (1.854 m)   Wt 195 lb (88.5 kg)   SpO2 96%   BMI 25.73 kg/m     Wt Readings from Last 3 Encounters:  04/19/24 195 lb (88.5 kg)  06/19/23 205 lb 12.8 oz (93.4 kg)  03/21/23 203 lb (  92.1 kg)     GEN:  Well nourished, well developed in no acute distress HEENT: Normal NECK: No JVD; No carotid bruits LYMPHATICS: No lymphadenopathy CARDIAC: He does have a grade 1/6 ejection murmur does not encompass S2 does not radiate to the carotids RRR, no murmurs, rubs, gallops RESPIRATORY:  Clear to auscultation without rales, wheezing or rhonchi  ABDOMEN: Soft, non-tender, non-distended MUSCULOSKELETAL:  No edema; No deformity  SKIN: Warm and dry NEUROLOGIC:  Alert and oriented x 3 PSYCHIATRIC:  Normal affect    Signed, Redell Leiter, MD  04/19/2024 11:52 AM    [facility]

## 2024-04-18 DIAGNOSIS — Q2381 Bicuspid aortic valve: Secondary | ICD-10-CM | POA: Insufficient documentation

## 2024-04-18 DIAGNOSIS — I5032 Chronic diastolic (congestive) heart failure: Secondary | ICD-10-CM | POA: Insufficient documentation

## 2024-04-18 DIAGNOSIS — H9190 Unspecified hearing loss, unspecified ear: Secondary | ICD-10-CM | POA: Insufficient documentation

## 2024-04-18 DIAGNOSIS — I10 Essential (primary) hypertension: Secondary | ICD-10-CM | POA: Insufficient documentation

## 2024-04-18 DIAGNOSIS — M25569 Pain in unspecified knee: Secondary | ICD-10-CM | POA: Insufficient documentation

## 2024-04-18 DIAGNOSIS — J45909 Unspecified asthma, uncomplicated: Secondary | ICD-10-CM | POA: Insufficient documentation

## 2024-04-19 ENCOUNTER — Ambulatory Visit: Attending: Cardiology | Admitting: Cardiology

## 2024-04-19 ENCOUNTER — Encounter: Payer: Self-pay | Admitting: Cardiology

## 2024-04-19 VITALS — BP 136/76 | HR 70 | Ht 73.0 in | Wt 195.0 lb

## 2024-04-19 DIAGNOSIS — I110 Hypertensive heart disease with heart failure: Secondary | ICD-10-CM | POA: Diagnosis not present

## 2024-04-19 DIAGNOSIS — E782 Mixed hyperlipidemia: Secondary | ICD-10-CM

## 2024-04-19 DIAGNOSIS — Z952 Presence of prosthetic heart valve: Secondary | ICD-10-CM | POA: Diagnosis not present

## 2024-04-19 DIAGNOSIS — G4733 Obstructive sleep apnea (adult) (pediatric): Secondary | ICD-10-CM

## 2024-04-19 DIAGNOSIS — I2510 Atherosclerotic heart disease of native coronary artery without angina pectoris: Secondary | ICD-10-CM | POA: Diagnosis not present

## 2024-04-19 DIAGNOSIS — I4892 Unspecified atrial flutter: Secondary | ICD-10-CM | POA: Diagnosis not present

## 2024-04-19 MED ORDER — ROSUVASTATIN CALCIUM 10 MG PO TABS: ORAL_TABLET | ORAL | 3 refills | Status: AC

## 2024-04-19 NOTE — Patient Instructions (Signed)
 Medication Instructions:  Your physician has recommended you make the following change in your medication:   START: Rosuvastatin  10 mg two days per week.  *If you need a refill on your cardiac medications before your next appointment, please call your pharmacy*  Lab Work: None If you have labs (blood work) drawn today and your tests are completely normal, you will receive your results only by: MyChart Message (if you have MyChart) OR A paper copy in the mail If you have any lab test that is abnormal or we need to change your treatment, we will call you to review the results.  Testing/Procedures: Your physician has requested that you have an echocardiogram. Echocardiography is a painless test that uses sound waves to create images of your heart. It provides your doctor with information about the size and shape of your heart and how well your heart's chambers and valves are working. This procedure takes approximately one hour. There are no restrictions for this procedure. Please do NOT wear cologne, perfume, aftershave, or lotions (deodorant is allowed). Please arrive 15 minutes prior to your appointment time.  Please note: We ask at that you not bring children with you during ultrasound (echo/ vascular) testing. Due to room size and safety concerns, children are not allowed in the ultrasound rooms during exams. Our front office staff cannot provide observation of children in our lobby area while testing is being conducted. An adult accompanying a patient to their appointment will only be allowed in the ultrasound room at the discretion of the ultrasound technician under special circumstances. We apologize for any inconvenience.   Follow-Up: At [facility], you and your health needs are our priority.  As part of our continuing mission to provide you with exceptional heart care, our providers are all part of one team.  This team includes your primary Cardiologist (physician) and  Advanced Practice Providers or APPs (Physician Assistants and Nurse Practitioners) who all work together to provide you with the care you need, when you need it.  Your next appointment:   1 year(s)  Provider:   Redell Leiter, MD    We recommend signing up for the patient portal called MyChart.  Sign up information is provided on this After Visit Summary.  MyChart is used to connect with patients for Virtual Visits (Telemedicine).  Patients are able to view lab/test results, encounter notes, upcoming appointments, etc.  Non-urgent messages can be sent to your provider as well.   To learn more about what you can do with MyChart, go to [email or web address].   Other Instructions None

## 2024-05-01 ENCOUNTER — Ambulatory Visit: Attending: Cardiology

## 2024-05-01 DIAGNOSIS — Z952 Presence of prosthetic heart valve: Secondary | ICD-10-CM | POA: Diagnosis not present

## 2024-05-01 DIAGNOSIS — I110 Hypertensive heart disease with heart failure: Secondary | ICD-10-CM

## 2024-05-01 DIAGNOSIS — G4733 Obstructive sleep apnea (adult) (pediatric): Secondary | ICD-10-CM

## 2024-05-01 DIAGNOSIS — I2510 Atherosclerotic heart disease of native coronary artery without angina pectoris: Secondary | ICD-10-CM | POA: Diagnosis not present

## 2024-05-01 DIAGNOSIS — E782 Mixed hyperlipidemia: Secondary | ICD-10-CM

## 2024-05-01 DIAGNOSIS — I4892 Unspecified atrial flutter: Secondary | ICD-10-CM | POA: Diagnosis not present

## 2024-05-01 LAB — ECHOCARDIOGRAM COMPLETE
AR max vel: 1.49 cm2
AV Area VTI: 1.41 cm2
AV Area mean vel: 1.59 cm2
AV Mean grad: 20.6 mmHg
AV Peak grad: 48.8 mmHg
Ao pk vel: 3.49 m/s
Area-P 1/2: 2.59 cm2
MV VTI: 1.98 cm2
S' Lateral: 2.6 cm

## 2024-05-02 ENCOUNTER — Ambulatory Visit: Payer: Self-pay | Admitting: Cardiology

## 2024-05-02 ENCOUNTER — Other Ambulatory Visit: Payer: Self-pay

## 2024-05-02 DIAGNOSIS — I110 Hypertensive heart disease with heart failure: Secondary | ICD-10-CM

## 2024-05-10 ENCOUNTER — Other Ambulatory Visit

## 2024-06-13 ENCOUNTER — Telehealth: Payer: Self-pay

## 2024-06-13 NOTE — Telephone Encounter (Signed)
 Received a message stating that the patient was calling asking about his echo results. Attempted to call the patient and he did not answer the phone. A message was left for the patient to call the office back.

## 2024-06-13 NOTE — Telephone Encounter (Signed)
 Called the patient and informed him of his echo results.

## 2024-06-13 NOTE — Telephone Encounter (Signed)
 Patient is returning call.

## 2024-06-14 NOTE — Telephone Encounter (Signed)
 Pt called in asking for an update on speak with Dr. Monetta about his aortic valve

## 2024-06-19 NOTE — Telephone Encounter (Signed)
 Called the patient and informed him that [facility] had been trying to contact him to schedule his CT, but they have not been able to speak with the patient. I explained that I would reach out to the [facility] and have them attempt to contact the patient again.

## 2024-06-21 DIAGNOSIS — R339 Retention of urine, unspecified: Secondary | ICD-10-CM | POA: Diagnosis not present

## 2024-06-21 DIAGNOSIS — R972 Elevated prostate specific antigen [PSA]: Secondary | ICD-10-CM | POA: Diagnosis not present

## 2024-06-21 DIAGNOSIS — K5909 Other constipation: Secondary | ICD-10-CM | POA: Diagnosis not present

## 2024-06-21 DIAGNOSIS — N401 Enlarged prostate with lower urinary tract symptoms: Secondary | ICD-10-CM | POA: Diagnosis not present

## 2024-06-28 ENCOUNTER — Ambulatory Visit (INDEPENDENT_AMBULATORY_CARE_PROVIDER_SITE_OTHER)
Admission: RE | Admit: 2024-06-28 | Discharge: 2024-06-28 | Disposition: A | Discharge status: Home / Self Care | Source: Ambulatory Visit | Attending: Cardiology | Admitting: Cardiology

## 2024-06-28 DIAGNOSIS — I110 Hypertensive heart disease with heart failure: Secondary | ICD-10-CM

## 2024-06-28 DIAGNOSIS — I509 Heart failure, unspecified: Secondary | ICD-10-CM

## 2024-06-28 DIAGNOSIS — I77810 Thoracic aortic ectasia: Secondary | ICD-10-CM | POA: Diagnosis not present

## 2024-07-02 ENCOUNTER — Encounter: Payer: Self-pay | Admitting: Cardiology

## 2024-07-02 ENCOUNTER — Ambulatory Visit: Payer: Self-pay | Admitting: Cardiology
